# Patient Record
Sex: Male | Born: 1935 | Race: White | Hispanic: No | Marital: Married | Smoking: Never smoker
Health system: Southern US, Community
[De-identification: ages and names within clinical notes are randomized; demographics above are authoritative.]

## PROBLEM LIST (undated history)

## (undated) DIAGNOSIS — K449 Diaphragmatic hernia without obstruction or gangrene: Secondary | ICD-10-CM

## (undated) DIAGNOSIS — G20 Parkinson's disease: Secondary | ICD-10-CM

## (undated) DIAGNOSIS — I499 Cardiac arrhythmia, unspecified: Secondary | ICD-10-CM

## (undated) DIAGNOSIS — G20A1 Parkinson's disease without dyskinesia, without mention of fluctuations: Secondary | ICD-10-CM

## (undated) DIAGNOSIS — K635 Polyp of colon: Secondary | ICD-10-CM

## (undated) DIAGNOSIS — R269 Unspecified abnormalities of gait and mobility: Secondary | ICD-10-CM

## (undated) DIAGNOSIS — G250 Essential tremor: Secondary | ICD-10-CM

## (undated) DIAGNOSIS — I10 Essential (primary) hypertension: Secondary | ICD-10-CM

## (undated) DIAGNOSIS — K219 Gastro-esophageal reflux disease without esophagitis: Secondary | ICD-10-CM

## (undated) DIAGNOSIS — C61 Malignant neoplasm of prostate: Secondary | ICD-10-CM

## (undated) DIAGNOSIS — J309 Allergic rhinitis, unspecified: Secondary | ICD-10-CM

## (undated) DIAGNOSIS — G252 Other specified forms of tremor: Secondary | ICD-10-CM

## (undated) DIAGNOSIS — K644 Residual hemorrhoidal skin tags: Secondary | ICD-10-CM

## (undated) DIAGNOSIS — M519 Unspecified thoracic, thoracolumbar and lumbosacral intervertebral disc disorder: Secondary | ICD-10-CM

## (undated) DIAGNOSIS — N486 Induration penis plastica: Secondary | ICD-10-CM

## (undated) DIAGNOSIS — K859 Acute pancreatitis without necrosis or infection, unspecified: Secondary | ICD-10-CM

## (undated) DIAGNOSIS — E785 Hyperlipidemia, unspecified: Secondary | ICD-10-CM

## (undated) HISTORY — DX: Allergic rhinitis, unspecified: J30.9

## (undated) HISTORY — PX: EYE SURGERY: SHX253

## (undated) HISTORY — DX: Parkinson's disease: G20

## (undated) HISTORY — DX: Hyperlipidemia, unspecified: E78.5

## (undated) HISTORY — DX: Residual hemorrhoidal skin tags: K64.4

## (undated) HISTORY — DX: Gastro-esophageal reflux disease without esophagitis: K21.9

## (undated) HISTORY — DX: Essential tremor: G25.2

## (undated) HISTORY — DX: Cardiac arrhythmia, unspecified: I49.9

## (undated) HISTORY — PX: KNEE SURGERY: SHX244

## (undated) HISTORY — PX: HEMORRHOID SURGERY: SHX153

## (undated) HISTORY — PX: CATARACT EXTRACTION: SUR2

## (undated) HISTORY — DX: Diaphragmatic hernia without obstruction or gangrene: K44.9

## (undated) HISTORY — DX: Polyp of colon: K63.5

## (undated) HISTORY — DX: Acute pancreatitis without necrosis or infection, unspecified: K85.90

## (undated) HISTORY — PX: SHOULDER SURGERY: SHX246

## (undated) HISTORY — DX: Essential (primary) hypertension: I10

## (undated) HISTORY — DX: Essential tremor: G25.0

## (undated) HISTORY — DX: Malignant neoplasm of prostate: C61

## (undated) HISTORY — DX: Unspecified thoracic, thoracolumbar and lumbosacral intervertebral disc disorder: M51.9

---
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
## 1974-08-21 DIAGNOSIS — K859 Acute pancreatitis without necrosis or infection, unspecified: Secondary | ICD-10-CM

## 1974-08-21 HISTORY — DX: Acute pancreatitis without necrosis or infection, unspecified: K85.90

## 1999-01-03 ENCOUNTER — Encounter: Payer: Self-pay | Admitting: Family Medicine

## 1999-01-03 ENCOUNTER — Ambulatory Visit (HOSPITAL_COMMUNITY): Admission: RE | Admit: 1999-01-03 | Discharge: 1999-01-03 | Payer: Self-pay | Admitting: *Deleted

## 2000-02-24 ENCOUNTER — Ambulatory Visit (HOSPITAL_COMMUNITY): Admission: RE | Admit: 2000-02-24 | Discharge: 2000-02-24 | Payer: Self-pay | Admitting: Gastroenterology

## 2000-11-12 ENCOUNTER — Encounter: Admission: RE | Admit: 2000-11-12 | Discharge: 2000-11-12 | Payer: Self-pay | Admitting: Gastroenterology

## 2000-11-12 ENCOUNTER — Encounter: Payer: Self-pay | Admitting: Gastroenterology

## 2001-03-18 ENCOUNTER — Ambulatory Visit (HOSPITAL_COMMUNITY): Admission: AD | Admit: 2001-03-18 | Discharge: 2001-03-18 | Payer: Self-pay | Admitting: Cardiology

## 2003-05-19 ENCOUNTER — Encounter: Payer: Self-pay | Admitting: Family Medicine

## 2003-05-19 ENCOUNTER — Encounter: Admission: RE | Admit: 2003-05-19 | Discharge: 2003-05-19 | Payer: Self-pay | Admitting: Family Medicine

## 2004-10-19 LAB — HM COLONOSCOPY

## 2004-10-24 ENCOUNTER — Ambulatory Visit (HOSPITAL_COMMUNITY): Admission: RE | Admit: 2004-10-24 | Discharge: 2004-10-24 | Payer: Self-pay | Admitting: Gastroenterology

## 2005-09-28 ENCOUNTER — Encounter: Admission: RE | Admit: 2005-09-28 | Discharge: 2005-09-28 | Payer: Self-pay | Admitting: Cardiology

## 2005-12-31 ENCOUNTER — Emergency Department (HOSPITAL_COMMUNITY): Admission: EM | Admit: 2005-12-31 | Discharge: 2005-12-31 | Payer: Self-pay | Admitting: Emergency Medicine

## 2006-01-04 ENCOUNTER — Ambulatory Visit (HOSPITAL_COMMUNITY): Admission: RE | Admit: 2006-01-04 | Discharge: 2006-01-04 | Payer: Self-pay | Admitting: Family Medicine

## 2006-01-13 ENCOUNTER — Ambulatory Visit (HOSPITAL_COMMUNITY): Admission: RE | Admit: 2006-01-13 | Discharge: 2006-01-13 | Payer: Self-pay | Admitting: Orthopedic Surgery

## 2006-02-26 ENCOUNTER — Encounter: Admission: RE | Admit: 2006-02-26 | Discharge: 2006-04-25 | Payer: Self-pay | Admitting: Orthopedic Surgery

## 2006-04-26 ENCOUNTER — Encounter: Admission: RE | Admit: 2006-04-26 | Discharge: 2006-07-25 | Payer: Self-pay | Admitting: Orthopedic Surgery

## 2010-11-11 ENCOUNTER — Encounter: Payer: Self-pay | Admitting: Family Medicine

## 2010-11-11 DIAGNOSIS — M2550 Pain in unspecified joint: Secondary | ICD-10-CM

## 2010-11-11 DIAGNOSIS — J309 Allergic rhinitis, unspecified: Secondary | ICD-10-CM

## 2010-11-11 DIAGNOSIS — C61 Malignant neoplasm of prostate: Secondary | ICD-10-CM | POA: Insufficient documentation

## 2010-11-11 DIAGNOSIS — E785 Hyperlipidemia, unspecified: Secondary | ICD-10-CM

## 2010-11-11 DIAGNOSIS — M545 Low back pain, unspecified: Secondary | ICD-10-CM

## 2010-11-11 DIAGNOSIS — I499 Cardiac arrhythmia, unspecified: Secondary | ICD-10-CM

## 2010-11-11 DIAGNOSIS — K635 Polyp of colon: Secondary | ICD-10-CM

## 2010-11-11 DIAGNOSIS — K859 Acute pancreatitis without necrosis or infection, unspecified: Secondary | ICD-10-CM

## 2010-11-11 DIAGNOSIS — I10 Essential (primary) hypertension: Secondary | ICD-10-CM | POA: Insufficient documentation

## 2011-01-11 ENCOUNTER — Ambulatory Visit (INDEPENDENT_AMBULATORY_CARE_PROVIDER_SITE_OTHER): Payer: Medicare Other | Admitting: Internal Medicine

## 2011-01-11 ENCOUNTER — Encounter: Payer: Self-pay | Admitting: Internal Medicine

## 2011-01-11 VITALS — BP 120/60 | HR 88 | Ht 69.0 in | Wt 166.0 lb

## 2011-01-11 DIAGNOSIS — K649 Unspecified hemorrhoids: Secondary | ICD-10-CM

## 2011-01-11 DIAGNOSIS — K648 Other hemorrhoids: Secondary | ICD-10-CM

## 2011-01-11 NOTE — Progress Notes (Signed)
HISTORY OF PRESENT ILLNESS:  Keith Hill is a 77 y.o. male who is in today regarding "external hemorrhoid". The patient reports a long history of what he describes as a prolapsing rectal lesion. He has been seen previously by Dr. Vida Rigger [facility]) for this problem and routine colonoscopies. His last colonoscopy was performed in March of 2006. I have reviewed the report, which states internal and external hemorrhoids with an anal papilla as well as sigmoid diverticulosis and a proximal colon polyp. The exam was complete to the cecum with adequate preparation. Review of the pathology showed hyperplastic tissue. It was recommended that a followup for routine colonoscopy in 10 years. He was seen again in April 2007 for a prolapsing rectal lesion. No bleeding or pain, only difficult to clean and requires manual decompression. This is the same complaint today. He did see a general surgeon, Dr. Ovidio Kin, in September 2007. I have reviewed the office note. The evaluation included rectal exam and anoscopy. The patient was not felt to have significant pathology at that time. The patient has no other GI complaints except for occasional constipation and gas.   REVIEW OF SYSTEMS:  All non-GI ROS negative except for muscle cramps.  Past Medical History  Diagnosis Date  . Pancreatitis 1976  . Lumbago   . Pain in joint, multiple sites   . Other and unspecified hyperlipidemia   . Hyperplasia of prostate   . Essential hypertension, benign   . Cardiac dysrhythmia, unspecified   . Allergic rhinitis, cause unspecified   . Colon polyps   . Hemorrhoids, external   . Hiatal hernia   . GERD (gastroesophageal reflux disease)   . Arthritis   . Hyperlipemia   . Hypertension     Past Surgical History  Procedure Date  . Leg surgery   . Shoulder surgery     Social History Keith Hill  reports that he has never smoked. He has never used smokeless tobacco. He reports that he does not drink  alcohol or use illicit drugs.  family history is not on file.  Allergies  Allergen Reactions  . Aspirin Free Childs (Acetaminophen)   . Calicum Glycerophosphate   . Niaspan (Niacin (Antihyperlipidemic))   . Prednisone   . Zocor (Simvastatin)        PHYSICAL EXAMINATION: Vital signs: BP 120/60  Pulse 88  Ht 5\' 9"  (1.753 m)  Wt 166 lb (75.297 kg)  BMI 24.51 kg/m2  Constitutional: generally well-appearing, no acute distress Psychiatric: alert and oriented x3, cooperative Eyes: extraocular movements intact, anicteric, conjunctiva pink Mouth: oral pharynx moist, no lesions Neck: supple no lymphadenopathy Cardiovascular: heart regular rate and rhythm, no murmur Lungs: clear to auscultation bilaterally Abdomen: soft, nontender, nondistended, no obvious ascites, no peritoneal signs, normal bowel sounds, no organomegaly Rectal: 1.5-2 cm pedunculated prolapsing lesion, either prolapsing hemorrhoid or anal papilla. Extremities: no lower extremity edema bilaterally Skin: no lesions on visible extremities Neuro: No focal deficits.   ASSESSMENT:  #1. Noninflamed nontender prolapsing rectal lesion as described. Either hemorrhoid or anal papilla. This requires surgical attention for definitive treatment.  #2. Colon cancer screening. Up-to-date. Due for routine followup around March 2016   PLAN:  #1. Surgical referral

## 2011-01-11 NOTE — Patient Instructions (Signed)
Appointment with Dr. Ezzard Standing CCS 01/12/11 2:00 pm arrive at 1:45 pm

## 2011-01-17 ENCOUNTER — Encounter: Payer: Self-pay | Admitting: Gastroenterology

## 2011-01-23 ENCOUNTER — Other Ambulatory Visit (INDEPENDENT_AMBULATORY_CARE_PROVIDER_SITE_OTHER): Payer: Self-pay | Admitting: Surgery

## 2011-01-23 ENCOUNTER — Ambulatory Visit (HOSPITAL_COMMUNITY)
Admission: RE | Admit: 2011-01-23 | Discharge: 2011-01-23 | Disposition: A | Discharge status: Home / Self Care | Payer: Medicare Other | Source: Ambulatory Visit | Attending: Surgery | Admitting: Surgery

## 2011-01-23 ENCOUNTER — Encounter (HOSPITAL_COMMUNITY): Payer: Medicare Other

## 2011-01-23 DIAGNOSIS — Z01818 Encounter for other preprocedural examination: Secondary | ICD-10-CM

## 2011-01-23 LAB — BASIC METABOLIC PANEL
BUN: 24 mg/dL — ABNORMAL HIGH (ref 6–23)
CO2: 27 mEq/L (ref 19–32)
Calcium: 9.4 mg/dL (ref 8.4–10.5)
Chloride: 102 mEq/L (ref 96–112)
Creatinine, Ser: 1.18 mg/dL (ref 0.4–1.5)
GFR calc Af Amer: >60 mL/min (ref >60)
GFR calc non Af Amer: 60 mL/min — ABNORMAL LOW (ref >60)
Sodium: 138 mEq/L (ref 135–145)

## 2011-01-23 LAB — SURGICAL PCR SCREEN
MRSA, PCR: NEGATIVE
Staphylococcus aureus: NEGATIVE

## 2011-01-23 LAB — CBC
MCHC: 33.9 g/dL (ref 30.0–36.0)
RBC: 3.92 MIL/uL — ABNORMAL LOW (ref 4.22–5.81)
WBC: 7.9 10*3/uL (ref 4.0–10.5)

## 2011-01-24 ENCOUNTER — Other Ambulatory Visit (INDEPENDENT_AMBULATORY_CARE_PROVIDER_SITE_OTHER): Payer: Self-pay | Admitting: Surgery

## 2011-01-24 ENCOUNTER — Ambulatory Visit (HOSPITAL_COMMUNITY)
Admission: RE | Admit: 2011-01-24 | Discharge: 2011-01-24 | Disposition: A | Discharge status: Home / Self Care | Payer: Medicare Other | Source: Ambulatory Visit | Attending: Surgery | Admitting: Surgery

## 2011-01-24 DIAGNOSIS — Z01818 Encounter for other preprocedural examination: Secondary | ICD-10-CM | POA: Insufficient documentation

## 2011-01-24 DIAGNOSIS — Z79899 Other long term (current) drug therapy: Secondary | ICD-10-CM | POA: Insufficient documentation

## 2011-01-24 DIAGNOSIS — I2510 Atherosclerotic heart disease of native coronary artery without angina pectoris: Secondary | ICD-10-CM | POA: Insufficient documentation

## 2011-01-24 DIAGNOSIS — K644 Residual hemorrhoidal skin tags: Secondary | ICD-10-CM | POA: Insufficient documentation

## 2011-01-24 DIAGNOSIS — Z01812 Encounter for preprocedural laboratory examination: Secondary | ICD-10-CM | POA: Insufficient documentation

## 2011-01-24 DIAGNOSIS — M19049 Primary osteoarthritis, unspecified hand: Secondary | ICD-10-CM | POA: Insufficient documentation

## 2011-01-24 DIAGNOSIS — E7800 Pure hypercholesterolemia, unspecified: Secondary | ICD-10-CM | POA: Insufficient documentation

## 2011-01-24 DIAGNOSIS — I10 Essential (primary) hypertension: Secondary | ICD-10-CM | POA: Insufficient documentation

## 2011-01-24 DIAGNOSIS — K5730 Diverticulosis of large intestine without perforation or abscess without bleeding: Secondary | ICD-10-CM | POA: Insufficient documentation

## 2011-01-24 DIAGNOSIS — N400 Enlarged prostate without lower urinary tract symptoms: Secondary | ICD-10-CM | POA: Insufficient documentation

## 2011-01-24 NOTE — Op Note (Signed)
Keith Hill, Keith Hill NO.:  [id number]  MEDICAL RECORD NO.:  [id number]  LOCATION:  DAYL                         FACILITY:  [facility]  PHYSICIAN:  Sandria Bales. Ezzard Standing, M.D.  DATE OF BIRTH:  [birth year]  DATE OF PROCEDURE:  01/24/2011                              OPERATIVE REPORT   PREOPERATIVE DIAGNOSIS:  Right anterior hemorrhoid.  POSTOPERATIVE DIAGNOSIS:  Right anterior hemorrhoid at approximately the 10 o'clock position, sigmoid diverticulosis.  PROCEDURE:  Single column hemorrhoidectomy, flexible sigmoidoscopy to 50 cm.  SURGEON:  Sandria Bales. Ezzard Standing, M.D.  FIRST ASSISTANT:  No first assistant.  ANESTHESIA:  General LMA with 20 cc of Exparel as a local anesthetic.  COMPLICATIONS:  Complications were none.  INDICATIONS FOR PROCEDURE:  Keith Hill is a 77 year old white male who sees Dr. Vernon Prey from a medical standpoint, Dr. Viann Fish from a cardiac standpoint and has seen Dr. Yancey Flemings from a GI standpoint.  He has had some hemorrhoidal symptoms which has gotten worse.  On physical examination he has a prominent external hemorrhoid of the right anterior rectum which has become irritated and bothering him with bowel movements.  I discussed with him proceeding with hemorrhoidectomy.  I discussed the indications and risks of hemorrhoid surgery.  The risks include, but are not limited to, bleeding, infection, nerve injury and having other hemorrhoids despite this one that could come in the future.  OPERATIVE NOTE:  The patient completed a mechanical bowel prep at home. He presented to the [facility].  He underwent a general endotracheal anesthetic as supervised by Dr. Eilene Ghazi in room #11.  He was given 1 gram of cefoxitin at the initiation of the procedure.  A time-out was held and the surgical checklist run.    I did a flexible sigmoidoscopy to 50 cm.  His last colonoscopy was about 5 or 6 years earlier.  I saw some sigmoid colon  diverticulosis, but he had no mucosal lesion or mass in the distal 50 cm of his colon.  I then prepped his rectum with Betadine solution and sterilely draped him and did an examination.  He has, in the lithotomy position, at  the 10 o'clock position, a 2-cm hypertrophic hemorrhoidal tissue.  There was no other rectal mass and minimal other hemorrhoid disease.  So I ligated this hemorrhoid with a 2-0 chromic suture and I then excised the hemorrhoid and closed the defect with a running 2-0 chromic suture.  I then injected Exparel, a long-acting bupivacaine, using 20 cc around the rectum.  I then put a gauze in the rectum and waited 5 minutes to make sure there was no bleeding.  The gauze was then removed.  I then packed his rectum with Gelfoam covered with Nupercaine ointment.  The patient tolerated the procedure well and was transported to the recovery room in good condition.  He will be discharged home today.  He will return to see me in 2 to 3 weeks for followup.    Sandria Bales. Ezzard Standing, M.D., FACS   DHN/MEDQ  D:  01/24/2011  T:  01/24/2011  Job:  [id number]  cc:   Ernestina Penna, M.D. Fax:  [phone]  Wilhemina Bonito. Marina Goodell, MD [address] [place] [address]  Dr. Viann Fish  Electronically Signed by Ovidio Kin M.D. on 01/24/2011 04:46:47 PM

## 2011-02-17 ENCOUNTER — Encounter (INDEPENDENT_AMBULATORY_CARE_PROVIDER_SITE_OTHER): Payer: Self-pay | Admitting: Surgery

## 2011-02-17 ENCOUNTER — Ambulatory Visit (INDEPENDENT_AMBULATORY_CARE_PROVIDER_SITE_OTHER): Payer: Medicare Other | Admitting: Surgery

## 2011-02-17 DIAGNOSIS — K648 Other hemorrhoids: Secondary | ICD-10-CM

## 2011-02-17 DIAGNOSIS — K644 Residual hemorrhoidal skin tags: Secondary | ICD-10-CM

## 2011-02-17 NOTE — Patient Instructions (Signed)
Call if you have any problems but this could be your last visit.  Copy of pathology given to patient.

## 2011-02-17 NOTE — Progress Notes (Signed)
HPI: The patient is a 77 year old white male who is a patient of Dr. Vernon Prey.  I did a single column hemorrhoidectomy on him on 24 January 2011. He has done well since surgery. He had some trouble with constipation early after surgery. He still has some staining of his underwear but this is getting better.  ROS: []   PE: Rectum: He has a healing wound on his rectum without inflammation.  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 I gave the patient copy of the path report.  Assessement: #1. Postop hemorrhoidectomy doing well. #2. BPH. #3. Hypertension.  Plan: I will make this his last visit with me and he knows to call me for any further problems.

## 2011-02-27 ENCOUNTER — Other Ambulatory Visit: Payer: Self-pay | Admitting: Cardiology

## 2011-02-27 DIAGNOSIS — I10 Essential (primary) hypertension: Secondary | ICD-10-CM

## 2011-03-02 ENCOUNTER — Ambulatory Visit
Admission: RE | Admit: 2011-03-02 | Discharge: 2011-03-02 | Disposition: A | Discharge status: Home / Self Care | Payer: Medicare Other | Source: Ambulatory Visit | Attending: Cardiology | Admitting: Cardiology

## 2011-03-02 DIAGNOSIS — I10 Essential (primary) hypertension: Secondary | ICD-10-CM

## 2011-08-31 DIAGNOSIS — I10 Essential (primary) hypertension: Secondary | ICD-10-CM | POA: Diagnosis not present

## 2011-08-31 DIAGNOSIS — E785 Hyperlipidemia, unspecified: Secondary | ICD-10-CM | POA: Diagnosis not present

## 2011-08-31 DIAGNOSIS — E559 Vitamin D deficiency, unspecified: Secondary | ICD-10-CM | POA: Diagnosis not present

## 2011-09-12 DIAGNOSIS — Z1212 Encounter for screening for malignant neoplasm of rectum: Secondary | ICD-10-CM | POA: Diagnosis not present

## 2011-10-03 DIAGNOSIS — N402 Nodular prostate without lower urinary tract symptoms: Secondary | ICD-10-CM | POA: Diagnosis not present

## 2011-11-01 DIAGNOSIS — R972 Elevated prostate specific antigen [PSA]: Secondary | ICD-10-CM | POA: Diagnosis not present

## 2011-11-01 DIAGNOSIS — N402 Nodular prostate without lower urinary tract symptoms: Secondary | ICD-10-CM | POA: Diagnosis not present

## 2011-11-01 HISTORY — PX: PROSTATE BIOPSY: SHX241

## 2011-11-09 DIAGNOSIS — G252 Other specified forms of tremor: Secondary | ICD-10-CM | POA: Diagnosis not present

## 2011-11-14 ENCOUNTER — Other Ambulatory Visit: Payer: Self-pay | Admitting: Neurology

## 2011-11-14 DIAGNOSIS — G250 Essential tremor: Secondary | ICD-10-CM

## 2011-11-16 ENCOUNTER — Ambulatory Visit
Admission: RE | Admit: 2011-11-16 | Discharge: 2011-11-16 | Disposition: A | Discharge status: Home / Self Care | Payer: Medicare Other | Source: Ambulatory Visit | Attending: Neurology | Admitting: Neurology

## 2011-11-16 DIAGNOSIS — G250 Essential tremor: Secondary | ICD-10-CM | POA: Diagnosis not present

## 2011-11-16 DIAGNOSIS — G252 Other specified forms of tremor: Secondary | ICD-10-CM | POA: Diagnosis not present

## 2011-11-21 DIAGNOSIS — C61 Malignant neoplasm of prostate: Secondary | ICD-10-CM | POA: Diagnosis not present

## 2011-12-05 DIAGNOSIS — E785 Hyperlipidemia, unspecified: Secondary | ICD-10-CM | POA: Diagnosis not present

## 2011-12-05 DIAGNOSIS — E039 Hypothyroidism, unspecified: Secondary | ICD-10-CM | POA: Diagnosis not present

## 2011-12-05 DIAGNOSIS — E559 Vitamin D deficiency, unspecified: Secondary | ICD-10-CM | POA: Diagnosis not present

## 2011-12-05 DIAGNOSIS — C61 Malignant neoplasm of prostate: Secondary | ICD-10-CM | POA: Diagnosis not present

## 2011-12-05 DIAGNOSIS — I10 Essential (primary) hypertension: Secondary | ICD-10-CM | POA: Diagnosis not present

## 2011-12-20 DIAGNOSIS — M545 Low back pain: Secondary | ICD-10-CM | POA: Diagnosis not present

## 2011-12-20 DIAGNOSIS — E785 Hyperlipidemia, unspecified: Secondary | ICD-10-CM | POA: Diagnosis not present

## 2011-12-20 DIAGNOSIS — N400 Enlarged prostate without lower urinary tract symptoms: Secondary | ICD-10-CM | POA: Diagnosis not present

## 2011-12-28 DIAGNOSIS — I10 Essential (primary) hypertension: Secondary | ICD-10-CM | POA: Diagnosis not present

## 2012-01-23 DIAGNOSIS — C61 Malignant neoplasm of prostate: Secondary | ICD-10-CM | POA: Diagnosis not present

## 2012-01-25 DIAGNOSIS — G252 Other specified forms of tremor: Secondary | ICD-10-CM | POA: Diagnosis not present

## 2012-01-25 DIAGNOSIS — G250 Essential tremor: Secondary | ICD-10-CM | POA: Diagnosis not present

## 2012-03-28 DIAGNOSIS — I10 Essential (primary) hypertension: Secondary | ICD-10-CM | POA: Diagnosis not present

## 2012-03-28 DIAGNOSIS — E559 Vitamin D deficiency, unspecified: Secondary | ICD-10-CM | POA: Diagnosis not present

## 2012-03-28 DIAGNOSIS — E785 Hyperlipidemia, unspecified: Secondary | ICD-10-CM | POA: Diagnosis not present

## 2012-03-28 DIAGNOSIS — R5381 Other malaise: Secondary | ICD-10-CM | POA: Diagnosis not present

## 2012-04-04 DIAGNOSIS — Z79899 Other long term (current) drug therapy: Secondary | ICD-10-CM | POA: Diagnosis not present

## 2012-04-04 DIAGNOSIS — D649 Anemia, unspecified: Secondary | ICD-10-CM | POA: Diagnosis not present

## 2012-04-04 DIAGNOSIS — I2510 Atherosclerotic heart disease of native coronary artery without angina pectoris: Secondary | ICD-10-CM | POA: Diagnosis not present

## 2012-04-04 DIAGNOSIS — R7989 Other specified abnormal findings of blood chemistry: Secondary | ICD-10-CM | POA: Diagnosis not present

## 2012-04-04 DIAGNOSIS — R5381 Other malaise: Secondary | ICD-10-CM | POA: Diagnosis not present

## 2012-04-05 DIAGNOSIS — Z1212 Encounter for screening for malignant neoplasm of rectum: Secondary | ICD-10-CM | POA: Diagnosis not present

## 2012-04-09 DIAGNOSIS — E785 Hyperlipidemia, unspecified: Secondary | ICD-10-CM | POA: Diagnosis not present

## 2012-04-09 DIAGNOSIS — I2510 Atherosclerotic heart disease of native coronary artery without angina pectoris: Secondary | ICD-10-CM | POA: Diagnosis not present

## 2012-04-09 DIAGNOSIS — I10 Essential (primary) hypertension: Secondary | ICD-10-CM | POA: Diagnosis not present

## 2012-04-11 DIAGNOSIS — H43399 Other vitreous opacities, unspecified eye: Secondary | ICD-10-CM | POA: Diagnosis not present

## 2012-04-11 DIAGNOSIS — H538 Other visual disturbances: Secondary | ICD-10-CM | POA: Diagnosis not present

## 2012-04-11 DIAGNOSIS — H2510 Age-related nuclear cataract, unspecified eye: Secondary | ICD-10-CM | POA: Diagnosis not present

## 2012-04-18 DIAGNOSIS — C61 Malignant neoplasm of prostate: Secondary | ICD-10-CM | POA: Diagnosis not present

## 2012-04-18 DIAGNOSIS — R972 Elevated prostate specific antigen [PSA]: Secondary | ICD-10-CM | POA: Diagnosis not present

## 2012-05-03 DIAGNOSIS — M7020 Olecranon bursitis, unspecified elbow: Secondary | ICD-10-CM | POA: Diagnosis not present

## 2012-05-03 DIAGNOSIS — M25529 Pain in unspecified elbow: Secondary | ICD-10-CM | POA: Diagnosis not present

## 2012-05-10 ENCOUNTER — Other Ambulatory Visit: Payer: Self-pay

## 2012-05-10 DIAGNOSIS — D485 Neoplasm of uncertain behavior of skin: Secondary | ICD-10-CM | POA: Diagnosis not present

## 2012-05-10 DIAGNOSIS — L820 Inflamed seborrheic keratosis: Secondary | ICD-10-CM | POA: Diagnosis not present

## 2012-05-14 DIAGNOSIS — Z23 Encounter for immunization: Secondary | ICD-10-CM | POA: Diagnosis not present

## 2012-05-28 DIAGNOSIS — G252 Other specified forms of tremor: Secondary | ICD-10-CM | POA: Diagnosis not present

## 2012-05-28 DIAGNOSIS — G250 Essential tremor: Secondary | ICD-10-CM | POA: Diagnosis not present

## 2012-07-02 DIAGNOSIS — R7989 Other specified abnormal findings of blood chemistry: Secondary | ICD-10-CM | POA: Diagnosis not present

## 2012-07-02 DIAGNOSIS — I10 Essential (primary) hypertension: Secondary | ICD-10-CM | POA: Diagnosis not present

## 2012-07-02 DIAGNOSIS — E559 Vitamin D deficiency, unspecified: Secondary | ICD-10-CM | POA: Diagnosis not present

## 2012-07-02 DIAGNOSIS — E785 Hyperlipidemia, unspecified: Secondary | ICD-10-CM | POA: Diagnosis not present

## 2012-07-08 DIAGNOSIS — E785 Hyperlipidemia, unspecified: Secondary | ICD-10-CM | POA: Diagnosis not present

## 2012-07-08 DIAGNOSIS — R259 Unspecified abnormal involuntary movements: Secondary | ICD-10-CM | POA: Diagnosis not present

## 2012-07-12 ENCOUNTER — Other Ambulatory Visit: Payer: Self-pay | Admitting: Family Medicine

## 2012-07-12 DIAGNOSIS — M545 Low back pain: Secondary | ICD-10-CM

## 2012-07-12 DIAGNOSIS — R269 Unspecified abnormalities of gait and mobility: Secondary | ICD-10-CM

## 2012-07-15 DIAGNOSIS — C61 Malignant neoplasm of prostate: Secondary | ICD-10-CM | POA: Diagnosis not present

## 2012-07-16 ENCOUNTER — Ambulatory Visit (HOSPITAL_COMMUNITY)
Admission: RE | Admit: 2012-07-16 | Discharge: 2012-07-16 | Disposition: A | Discharge status: Home / Self Care | Payer: Medicare Other | Source: Ambulatory Visit | Attending: Family Medicine | Admitting: Family Medicine

## 2012-07-16 DIAGNOSIS — M545 Low back pain, unspecified: Secondary | ICD-10-CM | POA: Insufficient documentation

## 2012-07-16 DIAGNOSIS — R269 Unspecified abnormalities of gait and mobility: Secondary | ICD-10-CM | POA: Diagnosis not present

## 2012-07-16 DIAGNOSIS — M47817 Spondylosis without myelopathy or radiculopathy, lumbosacral region: Secondary | ICD-10-CM | POA: Insufficient documentation

## 2012-07-16 DIAGNOSIS — M5137 Other intervertebral disc degeneration, lumbosacral region: Secondary | ICD-10-CM | POA: Diagnosis not present

## 2012-07-30 DIAGNOSIS — C61 Malignant neoplasm of prostate: Secondary | ICD-10-CM | POA: Diagnosis not present

## 2012-08-01 DIAGNOSIS — M479 Spondylosis, unspecified: Secondary | ICD-10-CM | POA: Diagnosis not present

## 2012-08-01 DIAGNOSIS — M5137 Other intervertebral disc degeneration, lumbosacral region: Secondary | ICD-10-CM | POA: Diagnosis not present

## 2012-08-01 DIAGNOSIS — M545 Low back pain: Secondary | ICD-10-CM | POA: Diagnosis not present

## 2012-08-03 DIAGNOSIS — G250 Essential tremor: Secondary | ICD-10-CM | POA: Insufficient documentation

## 2012-08-03 DIAGNOSIS — R6889 Other general symptoms and signs: Secondary | ICD-10-CM

## 2012-08-03 DIAGNOSIS — G252 Other specified forms of tremor: Secondary | ICD-10-CM | POA: Insufficient documentation

## 2012-08-16 DIAGNOSIS — M47817 Spondylosis without myelopathy or radiculopathy, lumbosacral region: Secondary | ICD-10-CM | POA: Diagnosis not present

## 2012-08-16 DIAGNOSIS — M5137 Other intervertebral disc degeneration, lumbosacral region: Secondary | ICD-10-CM | POA: Diagnosis not present

## 2012-08-30 DIAGNOSIS — M545 Low back pain: Secondary | ICD-10-CM | POA: Diagnosis not present

## 2012-08-30 DIAGNOSIS — M479 Spondylosis, unspecified: Secondary | ICD-10-CM | POA: Diagnosis not present

## 2012-08-30 DIAGNOSIS — M5137 Other intervertebral disc degeneration, lumbosacral region: Secondary | ICD-10-CM | POA: Diagnosis not present

## 2012-10-02 DIAGNOSIS — M999 Biomechanical lesion, unspecified: Secondary | ICD-10-CM | POA: Diagnosis not present

## 2012-10-02 DIAGNOSIS — M5137 Other intervertebral disc degeneration, lumbosacral region: Secondary | ICD-10-CM | POA: Diagnosis not present

## 2012-10-07 DIAGNOSIS — M5137 Other intervertebral disc degeneration, lumbosacral region: Secondary | ICD-10-CM | POA: Diagnosis not present

## 2012-10-07 DIAGNOSIS — M999 Biomechanical lesion, unspecified: Secondary | ICD-10-CM | POA: Diagnosis not present

## 2012-10-08 DIAGNOSIS — M5137 Other intervertebral disc degeneration, lumbosacral region: Secondary | ICD-10-CM | POA: Diagnosis not present

## 2012-10-08 DIAGNOSIS — M999 Biomechanical lesion, unspecified: Secondary | ICD-10-CM | POA: Diagnosis not present

## 2012-10-09 DIAGNOSIS — E559 Vitamin D deficiency, unspecified: Secondary | ICD-10-CM | POA: Diagnosis not present

## 2012-10-09 DIAGNOSIS — E785 Hyperlipidemia, unspecified: Secondary | ICD-10-CM | POA: Diagnosis not present

## 2012-10-09 DIAGNOSIS — I10 Essential (primary) hypertension: Secondary | ICD-10-CM | POA: Diagnosis not present

## 2012-10-10 DIAGNOSIS — M5137 Other intervertebral disc degeneration, lumbosacral region: Secondary | ICD-10-CM | POA: Diagnosis not present

## 2012-10-10 DIAGNOSIS — M999 Biomechanical lesion, unspecified: Secondary | ICD-10-CM | POA: Diagnosis not present

## 2012-10-14 DIAGNOSIS — M5137 Other intervertebral disc degeneration, lumbosacral region: Secondary | ICD-10-CM | POA: Diagnosis not present

## 2012-10-14 DIAGNOSIS — M999 Biomechanical lesion, unspecified: Secondary | ICD-10-CM | POA: Diagnosis not present

## 2012-10-15 DIAGNOSIS — M5137 Other intervertebral disc degeneration, lumbosacral region: Secondary | ICD-10-CM | POA: Diagnosis not present

## 2012-10-15 DIAGNOSIS — M999 Biomechanical lesion, unspecified: Secondary | ICD-10-CM | POA: Diagnosis not present

## 2012-10-16 DIAGNOSIS — M999 Biomechanical lesion, unspecified: Secondary | ICD-10-CM | POA: Diagnosis not present

## 2012-10-16 DIAGNOSIS — M5137 Other intervertebral disc degeneration, lumbosacral region: Secondary | ICD-10-CM | POA: Diagnosis not present

## 2012-10-17 DIAGNOSIS — I10 Essential (primary) hypertension: Secondary | ICD-10-CM | POA: Diagnosis not present

## 2012-10-17 DIAGNOSIS — E785 Hyperlipidemia, unspecified: Secondary | ICD-10-CM | POA: Diagnosis not present

## 2012-10-21 DIAGNOSIS — M999 Biomechanical lesion, unspecified: Secondary | ICD-10-CM | POA: Diagnosis not present

## 2012-10-21 DIAGNOSIS — M5137 Other intervertebral disc degeneration, lumbosacral region: Secondary | ICD-10-CM | POA: Diagnosis not present

## 2012-10-22 DIAGNOSIS — M999 Biomechanical lesion, unspecified: Secondary | ICD-10-CM | POA: Diagnosis not present

## 2012-10-22 DIAGNOSIS — M5137 Other intervertebral disc degeneration, lumbosacral region: Secondary | ICD-10-CM | POA: Diagnosis not present

## 2012-10-24 DIAGNOSIS — M5137 Other intervertebral disc degeneration, lumbosacral region: Secondary | ICD-10-CM | POA: Diagnosis not present

## 2012-10-24 DIAGNOSIS — M999 Biomechanical lesion, unspecified: Secondary | ICD-10-CM | POA: Diagnosis not present

## 2012-10-28 DIAGNOSIS — M999 Biomechanical lesion, unspecified: Secondary | ICD-10-CM | POA: Diagnosis not present

## 2012-10-28 DIAGNOSIS — M5137 Other intervertebral disc degeneration, lumbosacral region: Secondary | ICD-10-CM | POA: Diagnosis not present

## 2012-10-29 DIAGNOSIS — M5137 Other intervertebral disc degeneration, lumbosacral region: Secondary | ICD-10-CM | POA: Diagnosis not present

## 2012-10-29 DIAGNOSIS — M999 Biomechanical lesion, unspecified: Secondary | ICD-10-CM | POA: Diagnosis not present

## 2012-10-31 DIAGNOSIS — M5137 Other intervertebral disc degeneration, lumbosacral region: Secondary | ICD-10-CM | POA: Diagnosis not present

## 2012-10-31 DIAGNOSIS — M999 Biomechanical lesion, unspecified: Secondary | ICD-10-CM | POA: Diagnosis not present

## 2012-11-04 DIAGNOSIS — M5137 Other intervertebral disc degeneration, lumbosacral region: Secondary | ICD-10-CM | POA: Diagnosis not present

## 2012-11-04 DIAGNOSIS — M999 Biomechanical lesion, unspecified: Secondary | ICD-10-CM | POA: Diagnosis not present

## 2012-11-05 DIAGNOSIS — M5137 Other intervertebral disc degeneration, lumbosacral region: Secondary | ICD-10-CM | POA: Diagnosis not present

## 2012-11-05 DIAGNOSIS — M999 Biomechanical lesion, unspecified: Secondary | ICD-10-CM | POA: Diagnosis not present

## 2012-11-07 ENCOUNTER — Ambulatory Visit (INDEPENDENT_AMBULATORY_CARE_PROVIDER_SITE_OTHER): Payer: Medicare Other | Admitting: Neurology

## 2012-11-07 ENCOUNTER — Encounter: Payer: Self-pay | Admitting: Neurology

## 2012-11-07 VITALS — BP 118/70 | HR 68 | Ht 68.75 in | Wt 165.0 lb

## 2012-11-07 DIAGNOSIS — I10 Essential (primary) hypertension: Secondary | ICD-10-CM | POA: Diagnosis not present

## 2012-11-07 DIAGNOSIS — G20A1 Parkinson's disease without dyskinesia, without mention of fluctuations: Secondary | ICD-10-CM

## 2012-11-07 DIAGNOSIS — G20 Parkinson's disease: Secondary | ICD-10-CM | POA: Diagnosis not present

## 2012-11-07 DIAGNOSIS — M5137 Other intervertebral disc degeneration, lumbosacral region: Secondary | ICD-10-CM | POA: Diagnosis not present

## 2012-11-07 DIAGNOSIS — M999 Biomechanical lesion, unspecified: Secondary | ICD-10-CM | POA: Diagnosis not present

## 2012-11-07 HISTORY — DX: Parkinson's disease: G20

## 2012-11-07 HISTORY — DX: Parkinson's disease without dyskinesia, without mention of fluctuations: G20.A1

## 2012-11-07 MED ORDER — CARBIDOPA-LEVODOPA 25-100 MG PO TABS: 0.5000 | ORAL_TABLET | Freq: Three times a day (TID) | ORAL | Status: DC

## 2012-11-07 NOTE — Patient Instructions (Addendum)
  Will start sinemet, and follow up in 3 to 4 months.  Parkinson's Disease Parkinson's disease is a disorder of the central nervous system, which includes the brain and spinal cord. A person with this disease slowly loses the ability to completely control body movements. Within the brain, there is a group of nerve cells (basal ganglia) that help control movement. The basal ganglia are damaged and do not work properly in a person with Parkinson's disease. In addition, the basal ganglia produce and use a brain chemical called dopamine. The dopamine chemical sends messages to other parts of the body to control and coordinate body movements. Dopamine levels are low in a person with Parkinson's disease. If the dopamine levels are low, then the body does not receive the correct messages it needs to move normally.  CAUSES  The exact reason why the basal ganglia get damaged is not known. Some medical researchers have thought that infection, genes, environment, and certain medicines may contribute to the cause.  SYMPTOMS   An early symptom of Parkinson's disease is often an uncontrolled shaking (tremor) of the hands. The tremor will often disappear when the affected hand is consciously used.  As the disease progresses, walking, talking, getting out of a chair, and new movements become more difficult.  Muscles get stiff and movements become slower.  Balance and coordination become harder.  Depression, trouble swallowing, urinary problems, constipation, and sleep problems can occur.  Later in the disease, memory and thought processes may deteriorate. DIAGNOSIS  There are no specific tests to diagnose Parkinson's disease. You may be referred to a neurologist for evaluation. Your caregiver will ask about your medical history, symptoms, and perform a physical exam. Blood tests and imaging tests of your brain may be performed to rule out other diseases. The imaging tests may include an MRI or a CT  scan. TREATMENT  The goal of treatment is to relieve symptoms. Medicines may be prescribed once the symptoms become troublesome. Medicine will not stop the progression of the disease, but medicine can make movement and balance better and help control tremors. Speech and occupational therapy may also be prescribed. Sometimes, surgical treatment of the brain can be done in young people. HOME CARE INSTRUCTIONS  Get regular exercise and rest periods during the day to help prevent exhaustion and depression.  If getting dressed becomes difficult, replace buttons and zippers with Velcro and elastic on your clothing.  Take all medicine as directed by your caregiver.  Install grab bars or railings in your home to prevent falls.  Go to speech or occupational therapy as directed.  Keep all follow-up visits as directed by your caregiver. SEEK MEDICAL CARE IF:  Your symptoms are not controlled with your medicine.  You fall.  You have trouble swallowing or choke on your food. MAKE SURE YOU:  Understand these instructions.  Will watch your condition.  Will get help right away if you are not doing well or get worse. Document Released: 08/04/2000 Document Revised: 02/06/2012 Document Reviewed: 09/06/2011 [facility] Patient Information 2013 [place], [place].

## 2012-11-07 NOTE — Progress Notes (Signed)
   Reason for visit: Tremor  Keith Hill is an 79 y.o. male  History of present illness:  Keith Hill is a 79 year old right-handed white male with a history of a tremor. The patient indicates that the medications tried to date including Topamax, and propranolol have not been helpful. The patient was noted to have mild signs of parkinsonism on his last visit. The patient returns to this office for evaluation. The patient now has developed a unilateral resting tremor involving the right arm. The patient downplays the problems that he is having with feeding himself. The patient notes that with handwriting, the letters becomes small, and sloppy. The patient will occasionally stagger, and veer to the right. The patient has not had any falls. The patient returns for an evaluation.   ROS:  Out of a complete 14 system review of symptoms, the patient complains only of the following symptoms, and all other reviewed systems are negative.  Impotence Cramps Allergies Dizziness Tremor   Blood pressure 118/70, pulse 68, height 5' 8.75" (1.746 m), weight 165 lb (74.844 kg).  Physical Exam  General: The patient is alert and cooperative at the time of the examination.  Skin: No significant peripheral edema is noted.   Neurologic Exam  Cranial nerves: Facial symmetry is present. Speech is normal, no aphasia or dysarthria is noted. Extraocular movements are full. Visual fields are full. Masking of the face is seen.  Motor: The patient has good strength in all 4 extremities.  Coordination: The patient has good finger-nose-finger and heel-to-shin bilaterally. A mild intention tremor is seen bilaterally. A resting tremor is seen with the right arm.  Gait and station: The patient has a normal gait. Tandem gait is normal. Romberg is negative. No drift is seen. The patient is able to arise from a seated position with the arms crossed. Once up, the patient has a slightly stooped posture, and he has a  resting tremor with the right arm while walking. There is decreased arm swing on the right.  Reflexes: Deep tendon reflexes are symmetric.   Assessment/Plan:  One. Parkinson's disease  2. Intention tremor  The patient has had 2 different types of tremors. The patient has a mild intention tremor that is bilaterally symmetric, and now he has developed a prominent resting tremor affecting the right arm only. The patient has more prominent masking of the face, and slight stooped posture with walking. The right upper extremity tremor is prominent with walking. The patient will be placed on low-dose Sinemet, and he will followup in 3 or 4 months. The patient will be placed on Azilect at that time. The patient is off of propranolol.   Keith Palau MD 11/07/2012 2:52 PM

## 2012-12-12 ENCOUNTER — Other Ambulatory Visit: Payer: Self-pay | Admitting: Family Medicine

## 2012-12-19 ENCOUNTER — Telehealth: Payer: Self-pay | Admitting: Family Medicine

## 2012-12-19 DIAGNOSIS — G20 Parkinson's disease: Secondary | ICD-10-CM

## 2012-12-19 NOTE — Telephone Encounter (Signed)
Pt calls to report medication carivoldopa that was given 6 weeks ago hard for him to tell if working. Tremors no better Wants to know if Dr Anne Hahn is the best one to be seeing.

## 2012-12-19 NOTE — Telephone Encounter (Signed)
Pt aware we will call [facility] and will call Dr Gershon Crane at the [phone] or Dr Garth Schlatter  Same number Will need to do referral

## 2012-12-19 NOTE — Telephone Encounter (Signed)
We will check out in see [facility]

## 2013-01-02 ENCOUNTER — Telehealth: Payer: Self-pay | Admitting: Family Medicine

## 2013-01-02 DIAGNOSIS — H3530 Unspecified macular degeneration: Secondary | ICD-10-CM | POA: Diagnosis not present

## 2013-01-02 DIAGNOSIS — H2510 Age-related nuclear cataract, unspecified eye: Secondary | ICD-10-CM | POA: Diagnosis not present

## 2013-01-16 ENCOUNTER — Other Ambulatory Visit: Payer: Self-pay | Admitting: Family Medicine

## 2013-01-29 ENCOUNTER — Other Ambulatory Visit: Payer: Self-pay | Admitting: Neurology

## 2013-01-30 ENCOUNTER — Encounter: Payer: Self-pay | Admitting: *Deleted

## 2013-02-06 ENCOUNTER — Other Ambulatory Visit (INDEPENDENT_AMBULATORY_CARE_PROVIDER_SITE_OTHER): Payer: Medicare Other

## 2013-02-06 DIAGNOSIS — E559 Vitamin D deficiency, unspecified: Secondary | ICD-10-CM

## 2013-02-06 DIAGNOSIS — R5383 Other fatigue: Secondary | ICD-10-CM

## 2013-02-06 DIAGNOSIS — I10 Essential (primary) hypertension: Secondary | ICD-10-CM | POA: Diagnosis not present

## 2013-02-06 DIAGNOSIS — Z125 Encounter for screening for malignant neoplasm of prostate: Secondary | ICD-10-CM

## 2013-02-06 DIAGNOSIS — E785 Hyperlipidemia, unspecified: Secondary | ICD-10-CM | POA: Diagnosis not present

## 2013-02-06 DIAGNOSIS — R5381 Other malaise: Secondary | ICD-10-CM | POA: Diagnosis not present

## 2013-02-06 LAB — POCT CBC
Granulocyte percent: 76 %G (ref 37–80)
Hemoglobin: 13.4 g/dL — AB (ref 14.1–18.1)
Lymph, poc: 1.3 (ref 0.6–3.4)
MCH, POC: 34.4 pg — AB (ref 27–31.2)
MPV: 7.3 fL (ref 0–99.8)
POC LYMPH PERCENT: 18.8 %L (ref 10–50)
Platelet Count, POC: 235 10*3/uL (ref 142–424)
RBC: 3.9 M/uL — AB (ref 4.69–6.13)

## 2013-02-06 LAB — BASIC METABOLIC PANEL WITH GFR
CO2: 27 mEq/L (ref 19–32)
Chloride: 104 mEq/L (ref 96–112)
GFR, Est Non African American: 54 mL/min — ABNORMAL LOW
Potassium: 4.1 mEq/L (ref 3.5–5.3)

## 2013-02-06 LAB — HEPATIC FUNCTION PANEL
AST: 25 U/L (ref 0–37)
Albumin: 4.1 g/dL (ref 3.5–5.2)
Alkaline Phosphatase: 68 U/L (ref 39–117)
Bilirubin, Direct: 0.2 mg/dL (ref 0.0–0.3)
Indirect Bilirubin: 0.7 mg/dL (ref 0.0–0.9)
Total Bilirubin: 0.9 mg/dL (ref 0.3–1.2)

## 2013-02-06 LAB — PSA: PSA: 2.44 ng/mL (ref <=4.00)

## 2013-02-07 LAB — NMR LIPOPROFILE WITH LIPIDS
Cholesterol, Total: 127 mg/dL (ref <200)
HDL Particle Number: 31.6 umol/L (ref >=30.5)
HDL Size: 9.6 nm (ref >=9.2)
HDL-C: 48 mg/dL (ref >=40)
Large HDL-P: 5.9 umol/L (ref >=4.8)
Large VLDL-P: 1.1 nmol/L (ref <=2.7)
Triglycerides: 59 mg/dL (ref <150)

## 2013-02-07 LAB — VITAMIN D 25 HYDROXY (VIT D DEFICIENCY, FRACTURES): Vit D, 25-Hydroxy: 68 ng/mL (ref 30–89)

## 2013-02-10 NOTE — Telephone Encounter (Signed)
Error

## 2013-02-13 ENCOUNTER — Ambulatory Visit: Payer: Self-pay | Admitting: Family Medicine

## 2013-02-18 DIAGNOSIS — N402 Nodular prostate without lower urinary tract symptoms: Secondary | ICD-10-CM | POA: Diagnosis not present

## 2013-02-18 DIAGNOSIS — C61 Malignant neoplasm of prostate: Secondary | ICD-10-CM | POA: Diagnosis not present

## 2013-02-19 ENCOUNTER — Ambulatory Visit: Payer: Self-pay | Admitting: Family Medicine

## 2013-02-24 ENCOUNTER — Other Ambulatory Visit: Payer: Self-pay | Admitting: Family Medicine

## 2013-02-27 ENCOUNTER — Ambulatory Visit: Payer: Self-pay | Admitting: Family Medicine

## 2013-03-11 ENCOUNTER — Encounter: Payer: Self-pay | Admitting: Family Medicine

## 2013-03-11 ENCOUNTER — Ambulatory Visit (INDEPENDENT_AMBULATORY_CARE_PROVIDER_SITE_OTHER): Payer: Medicare Other | Admitting: Family Medicine

## 2013-03-11 VITALS — BP 120/61 | HR 72 | Temp 97.3°F | Ht 68.5 in | Wt 165.0 lb

## 2013-03-11 DIAGNOSIS — E785 Hyperlipidemia, unspecified: Secondary | ICD-10-CM

## 2013-03-11 DIAGNOSIS — L820 Inflamed seborrheic keratosis: Secondary | ICD-10-CM

## 2013-03-11 DIAGNOSIS — B070 Plantar wart: Secondary | ICD-10-CM | POA: Diagnosis not present

## 2013-03-11 DIAGNOSIS — J309 Allergic rhinitis, unspecified: Secondary | ICD-10-CM

## 2013-03-11 DIAGNOSIS — E559 Vitamin D deficiency, unspecified: Secondary | ICD-10-CM

## 2013-03-11 DIAGNOSIS — G20 Parkinson's disease: Secondary | ICD-10-CM

## 2013-03-11 DIAGNOSIS — C61 Malignant neoplasm of prostate: Secondary | ICD-10-CM

## 2013-03-11 DIAGNOSIS — I10 Essential (primary) hypertension: Secondary | ICD-10-CM

## 2013-03-11 NOTE — Patient Instructions (Signed)
Fall precautions discussed Continue current meds and therapeutic lifestyle changes 

## 2013-03-11 NOTE — Progress Notes (Addendum)
  Subjective:    Patient ID: Keith Hill, male    DOB: [birth year], 79 y.o.   MRN: [id number]  HPI Patient returns to clinic for followup of chronic medical problems and their management. These problems include hypertension hyperlipidemia and Parkinson's disease. He also has vitamin D deficiency. He is to see the neurologist again on August 11 and the urologist on August 14. He also has an appointment with a cardiologist in August. Labs reviewed with patient.  Review of Systems  Constitutional: Negative.   HENT: Positive for sneezing and postnasal drip (due to allergies, intermitently). Negative for ear pain and sore throat.   Eyes: Negative.   Respiratory: Negative.   Cardiovascular: Negative.   Gastrointestinal: Positive for constipation (intermitently).  Endocrine: Negative.   Musculoskeletal: Positive for back pain (mid thoracic).  Skin: Negative.   Allergic/Immunologic: Positive for environmental allergies (year around).  Neurological: Positive for tremors. Negative for weakness and headaches.  Psychiatric/Behavioral: Negative.        Objective:   Physical Exam  BP 120/61  Pulse 72  Temp(Src) 97.3 F (36.3 C) (Oral)  Ht 5' 8.5" (1.74 m)  Wt 165 lb (74.844 kg)  BMI 24.72 kg/m2  The patient appeared well nourished with tremor, and normally developed, alert and oriented to time and place. Speech, behavior and judgement appear normal. Vital signs as documented.  Head exam is unremarkable. No scleral icterus or pallor noted. Ears nose and throat were within normal limits.  Neck is without jugular venous distension, thyromegally, or carotid bruits. Carotid upstrokes are brisk bilaterally. No cervical adenopathy. Lungs are clear anteriorly and posteriorly to auscultation. Normal respiratory effort. Cardiac exam reveals regular rate and rhythm at 72 per minute. First and second heart sounds normal.  No murmurs, rubs or gallops.  Abdominal exam reveals normal bowl sounds, no  masses, no organomegaly and no aortic enlargement. No inguinal adenopathy. Extremities are nonedematous and both femoral and pedal pulses are normal. Persistent tremor in both hands right greater than left. Skin without pallor or jaundice.  Warm and dry, without rash. Neurologic exam reveals normal deep tendon reflexes and normal sensation. Tremor as noted above.         Assessment & Plan:  1. Hypertension  2. Hyperlipemia  3. Vitamin D deficiency  4. Parkinson disease - Ambulatory referral to Neurology  5. Other and unspecified hyperlipidemia  6. Allergic rhinitis,   7. Parkinson's -Followed by Dr. Anne Hahn  8. Prostate cancer -Followed by Dr. Annabell Howells   Patient Instructions  Fall precautions discussed Continue current meds and therapeutic lifestyle changes   Keep appointments as discussed  Nyra Capes MD  This is an addendum to the above note. Patient requested treatment of lesions on his right wrist and left antecubital fossa. The lesion on the right wrist was a wart that was growing and become irritated and the 2 lesions on the left antecubital fossa were irritated seborrheic keratoses. These were treated with cryotherapy at patient's request . He tolerated the procedure well and was then discharged from the visit.  Nyra Capes MD

## 2013-03-31 ENCOUNTER — Ambulatory Visit (INDEPENDENT_AMBULATORY_CARE_PROVIDER_SITE_OTHER): Payer: Medicare Other | Admitting: Neurology

## 2013-03-31 ENCOUNTER — Encounter: Payer: Self-pay | Admitting: Neurology

## 2013-03-31 VITALS — BP 123/69 | HR 85 | Temp 98.0°F | Ht 69.0 in | Wt 166.0 lb

## 2013-03-31 DIAGNOSIS — G252 Other specified forms of tremor: Secondary | ICD-10-CM | POA: Diagnosis not present

## 2013-03-31 DIAGNOSIS — G20 Parkinson's disease: Secondary | ICD-10-CM | POA: Diagnosis not present

## 2013-03-31 DIAGNOSIS — G250 Essential tremor: Secondary | ICD-10-CM | POA: Diagnosis not present

## 2013-03-31 MED ORDER — RASAGILINE MESYLATE 1 MG PO TABS: 1.0000 mg | ORAL_TABLET | Freq: Every day | ORAL | Status: DC

## 2013-03-31 NOTE — Progress Notes (Signed)
Reason for visit: Parkinson's disease  Keith Hill is an 79 y.o. male  History of present illness:  Keith Hill is a 79 year old right-handed white male with a history of Parkinson's disease. The patient has a resting tremor on the right arm, and he also has an essential tremor affecting both arms. The patient is on Sinemet taking the 25/100 mg tablet, one half tablet 3 times daily. The patient has not gained any improvement with the tremor on this medication. The patient takes Benadryl at night for sleep, taking one half of a 25 mg tablet. The patient continues to remain quite active, working out several times a week, and playing golf 3 times a week. The patient denies any balance issues, and no falls. The patient denies problems with swallowing. The patient returns for an evaluation.  Past Medical History  Diagnosis Date  . Pancreatitis 1976  . Lumbago   . Pain in joint, multiple sites   . Other and unspecified hyperlipidemia   . Hyperplasia of prostate   . Essential hypertension, benign   . Cardiac dysrhythmia, unspecified   . Allergic rhinitis, cause unspecified   . Colon polyps   . Hemorrhoids, external   . Hiatal hernia   . GERD (gastroesophageal reflux disease)   . Arthritis   . Hyperlipemia   . Hypertension   . Essential and other specified forms of tremor   . Other general symptoms(780.99)   . Paralysis agitans 11/07/2012    Past Surgical History  Procedure Laterality Date  . Leg surgery    . Shoulder surgery    . Hemorrhoid surgery      Family History  Problem Relation Age of Onset  . Stroke Father   . Kidney failure Father   . Alcoholism Brother   . Heart attack Brother     Social history:  reports that he has never smoked. He has never used smokeless tobacco. He reports that he does not drink alcohol or use illicit drugs.  Allergies:  Allergies  Allergen Reactions  . Aspirin     Upsets stomach  . Aspirin Free Childs (Acetaminophen)   . Calicum  Glycerophosphate   . Niaspan (Niacin Er)   . Prednisone   . Zocor (Simvastatin)     Medications:  Current Outpatient Prescriptions on File Prior to Visit  Medication Sig Dispense Refill  . amLODipine (NORVASC) 10 MG tablet 10 mg. One half tab po daily      . carbidopa-levodopa (SINEMET IR) 25-100 MG per tablet TAKE (1/2) TABLET THREE TIMES DAILY.  45 tablet  3  . Cholecalciferol (VITAMIN D3) 5000 UNITS CAPS Take 1 capsule by mouth 4 (four) times a week.       . diphenhydrAMINE (BENADRYL) 25 MG tablet Take 25 mg by mouth at bedtime as needed for itching (1/2 tablet).      Marland Kitchen docusate sodium (COLACE) 100 MG capsule Take 100 mg by mouth 2 (two) times daily. daily      . fluticasone (FLONASE) 50 MCG/ACT nasal spray       . Folic Acid-Vit B6-Vit B12 (FOLGARD RX 2.2 PO) Take 1 tablet by mouth daily.        . Probiotic Product (PHILLIPS COLON HEALTH PO) Take by mouth 1 dose over 24 hours.        Marland Kitchen terazosin (HYTRIN) 1 MG capsule Take 1 mg by mouth daily.      . valsartan-hydrochlorothiazide (DIOVAN-HCT) 320-12.5 MG per tablet Take 1/4 tablet daily  No current facility-administered medications on file prior to visit.    ROS:  Out of a complete 14 system review of symptoms, the patient complains only of the following symptoms, and all other reviewed systems are negative.  Tremor  Blood pressure 123/69, pulse 85, temperature 98 F (36.7 C), temperature source Oral, height 5\' 9"  (1.753 m), weight 166 lb (75.297 kg).  Physical Exam  General: The patient is alert and cooperative at the time of the examination.  Skin: No significant peripheral edema is noted.   Neurologic Exam  Cranial nerves: Facial symmetry is not present. The patient has mild right-sided ptosis. Speech is normal, no aphasia or dysarthria is noted. Extraocular movements are full. Visual fields are full. Masking of the face is seen.  Motor: The patient has good strength in all 4 extremities.  Coordination: The  patient has good finger-nose-finger and heel-to-shin bilaterally. The patient has an intention tremor with finger-nose-finger bilaterally, and a resting tremor of the right upper extremity.  Gait and station: The patient has a normal gait. The patient is able to arise from a seated position with arms crossed. With ambulation, the patient has a tremor involving the right arm, decreased arm swing. Tandem gait is normal. Romberg is negative. No drift is seen.  Reflexes: Deep tendon reflexes are symmetric.   Assessment/Plan:  One. Parkinson's disease  2. Essential tremor  The patient has 2 types of tremor, he has a symmetric essential tremor with finger-nose-finger, and an asymmetric resting tremor with his Parkinson's disease. The patient will be placed on Azilect, samples were given. The patient will try Benadryl taking one half tablet 2 or 3 times daily, but if this causes too much drowsiness, he will contact our office, and we will consider Cogentin or Artane for the tremor. The patient will followup in 4 months. The patient is to remain active.  Keith Palau MD 03/31/2013 7:21 PM  [facility] [address] [place], [place] [address]  Phone [phone] Fax [phone]

## 2013-04-03 DIAGNOSIS — C61 Malignant neoplasm of prostate: Secondary | ICD-10-CM | POA: Diagnosis not present

## 2013-04-03 DIAGNOSIS — R972 Elevated prostate specific antigen [PSA]: Secondary | ICD-10-CM | POA: Diagnosis not present

## 2013-04-03 HISTORY — PX: PROSTATE BIOPSY: SHX241

## 2013-04-08 ENCOUNTER — Encounter: Payer: Self-pay | Admitting: Cardiology

## 2013-04-08 DIAGNOSIS — E785 Hyperlipidemia, unspecified: Secondary | ICD-10-CM | POA: Diagnosis not present

## 2013-04-08 DIAGNOSIS — I2510 Atherosclerotic heart disease of native coronary artery without angina pectoris: Secondary | ICD-10-CM | POA: Diagnosis not present

## 2013-04-08 DIAGNOSIS — I10 Essential (primary) hypertension: Secondary | ICD-10-CM | POA: Diagnosis not present

## 2013-04-10 ENCOUNTER — Other Ambulatory Visit: Payer: Self-pay | Admitting: Family Medicine

## 2013-04-22 ENCOUNTER — Encounter: Payer: Self-pay | Admitting: Radiation Oncology

## 2013-04-22 NOTE — Progress Notes (Signed)
GU Location of Tumor / Histology: adenocarcinoma of the prostate   If Prostate Cancer, Gleason Score is (3 + 3=6) and PSA is (2.4)  Patient has been under active surveillance since 2013 for prostate cancer.  Biopsies of prostate (if applicable) revealed:     Past/Anticipated interventions by urology, if any: office follow up  Past/Anticipated interventions by medical oncology, if any: None  Weight changes, if any: no recent unintentional weight loss   Bowel/Bladder complaints, if any: reports urgency, denies dysuria, denies hematuria, reports urgency urinary incontinence, reports nocturia   Nausea/Vomiting, if any: None noted  Pain issues, if any:  Denies bone pain  SAFETY ISSUES:  Prior radiation? NO  Pacemaker/ICD? NO  Possible current pregnancy? NO  Is the patient on methotrexate? NO  Current Complaints / other details:  79 year old male. Initial prostate biopsy done 11/01/2011 and repeat biopsy done 04/03/2013. Prostate volume 47.34 cc.

## 2013-04-23 ENCOUNTER — Ambulatory Visit
Admission: RE | Admit: 2013-04-23 | Discharge: 2013-04-23 | Disposition: A | Discharge status: Home / Self Care | Payer: Medicare Other | Source: Ambulatory Visit | Attending: Radiation Oncology | Admitting: Radiation Oncology

## 2013-04-23 ENCOUNTER — Encounter: Payer: Self-pay | Admitting: Radiation Oncology

## 2013-04-23 VITALS — BP 129/59 | HR 72 | Temp 98.2°F | Resp 16 | Ht 68.0 in | Wt 165.5 lb

## 2013-04-23 DIAGNOSIS — C61 Malignant neoplasm of prostate: Secondary | ICD-10-CM

## 2013-04-23 DIAGNOSIS — E785 Hyperlipidemia, unspecified: Secondary | ICD-10-CM | POA: Diagnosis not present

## 2013-04-23 DIAGNOSIS — G20 Parkinson's disease: Secondary | ICD-10-CM | POA: Insufficient documentation

## 2013-04-23 DIAGNOSIS — Z79899 Other long term (current) drug therapy: Secondary | ICD-10-CM | POA: Insufficient documentation

## 2013-04-23 DIAGNOSIS — G20A1 Parkinson's disease without dyskinesia, without mention of fluctuations: Secondary | ICD-10-CM | POA: Insufficient documentation

## 2013-04-23 DIAGNOSIS — I10 Essential (primary) hypertension: Secondary | ICD-10-CM | POA: Diagnosis not present

## 2013-04-23 HISTORY — DX: Parkinson's disease without dyskinesia, without mention of fluctuations: G20.A1

## 2013-04-23 HISTORY — DX: Induration penis plastica: N48.6

## 2013-04-23 HISTORY — DX: Parkinson's disease: G20

## 2013-04-23 NOTE — Progress Notes (Signed)
Radiation Oncology         [phone] ________________________________  Initial outpatient Consultation  Name: Keith Hill MRN: [id number]  Date: 04/23/2013  DOB: March 01, 1932  JX:[id number], Keith Hill, MD  Anner Crete, MD   REFERRING PHYSICIAN: Anner Crete, MD  DIAGNOSIS: 79 y.o. gentleman with stage T2a adenocarcinoma of the prostate with a Gleason's score of 3+3 and a PSA of 2.4  HISTORY OF PRESENT ILLNESS::Keith Hill is a 79 y.o. gentleman.  He was noted to have a right apical prostate nodule and a rising PSA up to 2.4 by his primary care physician, Dr. Christell Constant.  Accordingly, he was referred for evaluation in urology by Dr. Annabell Howells on 10/03/11,  digital rectal examination was performed at that time confirming a 2+ gland with a right apical nodule.  The patient proceeded to transrectal ultrasound with 12 biopsies of the prostate on 11/01/11.  The prostate volume measured 41 cc.  Out of 12 core biopsies, one was positive.  The maximum Gleason score was 3+3, and this was seen in 30% of the right apex.  The patient reviewed the biopsy results with his urologist and elected to pursue active surveillance.  PSA remained essentially stable.  Follow-up TRUS for biopsy was done on 04/03/13.  The volume measured 47 cc.  Two out of 12 cores were positive with Gleason's 3+3.  He has kindly been referred today for discussion of potential radiation treatment options versus continued surveillance.   PREVIOUS RADIATION THERAPY: No  PAST MEDICAL HISTORY:  has a past medical history of Pancreatitis (1976); Lumbar disc disease; Hyperlipidemia; Hyperplasia of prostate; Cardiac dysrhythmia, unspecified; Allergic rhinitis, cause unspecified; Colon polyps; Hemorrhoids, external; Hiatal hernia; GERD (gastroesophageal reflux disease); Hypertension; Essential and other specified forms of tremor; Paralysis agitans (11/07/2012); Prostate cancer; Parkinson disease; and Peyronie's disease.    PAST SURGICAL HISTORY: Past  Surgical History  Procedure Laterality Date  . Knee surgery    . Shoulder surgery Left   . Hemorrhoid surgery    . Prostate biopsy  04/03/2013  . Prostate biopsy  11/01/2011    FAMILY HISTORY: family history includes Alcoholism in his brother; Heart attack in his brother; Kidney failure in his father; Stroke in his father. There is no history of Cancer.  SOCIAL HISTORY:  reports that he has never smoked. He has never used smokeless tobacco. He reports that he does not drink alcohol or use illicit drugs.  ALLERGIES: Aspirin; Aspirin free childs; Calicum glycerophosphate; Niaspan; Prednisone; and Zocor  MEDICATIONS:  Current Outpatient Prescriptions  Medication Sig Dispense Refill  . amLODipine (NORVASC) 10 MG tablet 10 mg. One half tab po daily      . atorvastatin (LIPITOR) 40 MG tablet       . carbidopa-levodopa (SINEMET IR) 25-100 MG per tablet TAKE (1/2) TABLET THREE TIMES DAILY.  45 tablet  3  . Cholecalciferol (VITAMIN D3) 5000 UNITS CAPS Take 1 capsule by mouth 4 (four) times a week.       . diphenhydrAMINE (BENADRYL) 25 MG tablet Take 25 mg by mouth at bedtime as needed for itching (1/2 tablet).      Marland Kitchen docusate sodium (COLACE) 100 MG capsule Take 100 mg by mouth 2 (two) times daily. daily      . fluticasone (FLONASE) 50 MCG/ACT nasal spray       . Folic Acid-Vit B6-Vit B12 (FOLGARD RX 2.2 PO) Take 1 tablet by mouth daily.        Marland Kitchen omega-3 acid ethyl esters (LOVAZA)  1 G capsule Take 2 g by mouth 2 (two) times daily.      . Probiotic Product (PHILLIPS COLON HEALTH PO) Take by mouth 1 dose over 24 hours.        . rasagiline (AZILECT) 1 MG TABS tablet Take 1 tablet (1 mg total) by mouth daily.  30 tablet  5  . terazosin (HYTRIN) 1 MG capsule Take 1 mg by mouth daily.      Marland Kitchen terazosin (HYTRIN) 2 MG capsule TAKE 1 CAPSULE AT BEDTIME  90 capsule  1  . valsartan-hydrochlorothiazide (DIOVAN-HCT) 320-12.5 MG per tablet Take 1/4 tablet daily       No current facility-administered  medications for this encounter.    REVIEW OF SYSTEMS:  A 15 point review of systems is documented in the electronic medical record. This was obtained by the nursing staff. However, I reviewed this with the patient to discuss relevant findings and make appropriate changes.  A comprehensive review of systems was negative..  The patient completed an IPSS and IIEF questionnaire.  His IPSS score was 6 indicating mild urinary outflow obstructive symptoms.  He indicated that his erectile function is not able to complete sexual activity.   PHYSICAL EXAM: This patient is in no acute distress.  He is alert and oriented.   height is 5\' 8"  (1.727 m) and weight is 165 lb 8 oz (75.07 kg). His oral temperature is 98.2 F (36.8 C). His blood pressure is 129/59 and his pulse is 72. His respiration is 16 and oxygen saturation is 100%.  He exhibits no respiratory distress or labored breathing.  He appears neurologically intact.  His mood is pleasant.  His affect is appropriate.  Please note the digital rectal exam findings described above.  KPS = 100 - Normal; no complaints; no evidence of disease.  LABORATORY DATA:  Lab Results  Component Value Date   WBC 7.1 02/06/2013   HGB 13.4* 02/06/2013   HCT 38.4* 02/06/2013   MCV 98.7* 02/06/2013   PLT 243 01/23/2011   Lab Results  Component Value Date   NA 140 02/06/2013   K 4.1 02/06/2013   CL 104 02/06/2013   CO2 27 02/06/2013   Lab Results  Component Value Date   ALT 33 02/06/2013   AST 25 02/06/2013   ALKPHOS 68 02/06/2013   BILITOT 0.9 02/06/2013     RADIOGRAPHY: No results found.    IMPRESSION: This gentleman is a 79 y.o. gentleman with stage T2a adenocarcinoma of the prostate with a Gleason's score of 3+3 and a PSA of 2.4.  His T-Stage, Gleason's Score, and PSA put him into the favorable risk group.  Accordingly he is eligible for a variety of potential treatment options including continued active surveillance, radical prostatectomy, external beam radiotherapy,  and prostate seed implant.  PLAN:Today I reviewed the findings and workup thus far.  We discussed the natural history of prostate cancer.  We reviewed the the implications of T-stage, Gleason's Score, and PSA on decision-making and outcomes in prostate cancer.  We discussed radiation treatment in the management of prostate cancer with regard to the logistics and delivery of external beam radiation treatment as well as the logistics and delivery of prostate brachytherapy.  We compared and contrasted each of these approaches and also compared these against prostatectomy.  The patient expressed interest in prostate brachytherapy vs. continued surveillance.  I filled out a patient counseling form for him with relevant treatment diagrams and we retained a copy for our records.  The patient would like to proceed with continued surveillance.  I will share my findings with Dr. Annabell Howells and be happy to see him again in the future should he elect to pursue seed implant.     I enjoyed meeting with him today, and will look forward to participating in the care of this very nice gentleman.   I spent 60 minutes face to face with the patient and more than 50% of that time was spent in counseling and/or coordination of care.   ------------------------------------------------  Artist Pais. Kathrynn Running, M.D.

## 2013-04-23 NOTE — Progress Notes (Signed)
See progress note under physician encounter. 

## 2013-04-23 NOTE — Progress Notes (Signed)
Reports on average he gets up twice during the night to void but, goes on to say he has done this since childhood. Denies dysuria or hematuria. Reports urgency and urgency urinary incontinence. Denies unintentional weight loss or night sweats. Denies nausea, vomiting, headache, or dizziness. Remains active playing golf regularly. Denies diarrhea, blood in stool, and pain associated with bowel movements. IPSS 6 noted. Denies pain.

## 2013-04-28 DIAGNOSIS — G20 Parkinson's disease: Secondary | ICD-10-CM | POA: Diagnosis not present

## 2013-05-29 ENCOUNTER — Ambulatory Visit: Payer: Medicare Other

## 2013-05-29 ENCOUNTER — Ambulatory Visit (INDEPENDENT_AMBULATORY_CARE_PROVIDER_SITE_OTHER): Payer: Medicare Other

## 2013-05-29 DIAGNOSIS — Z23 Encounter for immunization: Secondary | ICD-10-CM

## 2013-08-02 ENCOUNTER — Other Ambulatory Visit: Payer: Self-pay | Admitting: Family Medicine

## 2013-08-22 ENCOUNTER — Other Ambulatory Visit (INDEPENDENT_AMBULATORY_CARE_PROVIDER_SITE_OTHER): Payer: Medicare Other

## 2013-08-22 DIAGNOSIS — E785 Hyperlipidemia, unspecified: Secondary | ICD-10-CM | POA: Diagnosis not present

## 2013-08-22 DIAGNOSIS — I10 Essential (primary) hypertension: Secondary | ICD-10-CM | POA: Diagnosis not present

## 2013-08-22 DIAGNOSIS — E559 Vitamin D deficiency, unspecified: Secondary | ICD-10-CM | POA: Diagnosis not present

## 2013-08-22 LAB — POCT CBC
GRANULOCYTE PERCENT: 79.4 % (ref 37–80)
HCT, POC: 39.8 % — AB (ref 43.5–53.7)
Hemoglobin: 13.6 g/dL — AB (ref 14.1–18.1)
LYMPH, POC: 1.5 (ref 0.6–3.4)
MCH, POC: 32.9 pg — AB (ref 27–31.2)
MCHC: 34 g/dL (ref 31.8–35.4)
MCV: 96.6 fL (ref 80–97)
MPV: 6.5 fL (ref 0–99.8)
PLATELET COUNT, POC: 275 10*3/uL (ref 142–424)
POC GRANULOCYTE: 6.5 (ref 2–6.9)
POC LYMPH %: 18.9 % (ref 10–50)
RBC: 4.1 M/uL — AB (ref 4.69–6.13)
RDW, POC: 12.5 %
WBC: 8.2 10*3/uL (ref 4.6–10.2)

## 2013-08-22 NOTE — Progress Notes (Signed)
Pt came in for labs only 

## 2013-08-24 LAB — NMR, LIPOPROFILE
Cholesterol: 118 mg/dL (ref <200)
HDL CHOLESTEROL BY NMR: 50 mg/dL (ref >=40)
HDL PARTICLE NUMBER: 31.1 umol/L (ref >=30.5)
LDL Particle Number: 659 nmol/L (ref <1000)
LDL Size: 20.6 nm (ref >20.5)
LDLC SERPL CALC-MCNC: 54 mg/dL (ref <100)
LP-IR Score: 30 (ref <=45)
SMALL LDL PARTICLE NUMBER: 444 nmol/L (ref <=527)
TRIGLYCERIDES BY NMR: 69 mg/dL (ref <150)

## 2013-08-24 LAB — BMP8+EGFR
BUN/Creatinine Ratio: 13 (ref 10–22)
BUN: 15 mg/dL (ref 8–27)
CALCIUM: 9.6 mg/dL (ref 8.6–10.2)
CO2: 22 mmol/L (ref 18–29)
CREATININE: 1.16 mg/dL (ref 0.76–1.27)
Chloride: 100 mmol/L (ref 97–108)
GFR calc Af Amer: 69 mL/min/{1.73_m2} (ref >59)
GFR calc non Af Amer: 60 mL/min/{1.73_m2} (ref >59)
GLUCOSE: 95 mg/dL (ref 65–99)
Potassium: 4 mmol/L (ref 3.5–5.2)
Sodium: 140 mmol/L (ref 134–144)

## 2013-08-24 LAB — VITAMIN D 25 HYDROXY (VIT D DEFICIENCY, FRACTURES): Vit D, 25-Hydroxy: 52.2 ng/mL (ref 30.0–100.0)

## 2013-08-24 LAB — HEPATIC FUNCTION PANEL
ALBUMIN: 4.2 g/dL (ref 3.5–4.8)
ALT: 34 IU/L (ref 0–44)
AST: 24 IU/L (ref 0–40)
Alkaline Phosphatase: 89 IU/L (ref 39–117)
BILIRUBIN TOTAL: 0.9 mg/dL (ref 0.0–1.2)
Bilirubin, Direct: 0.24 mg/dL (ref 0.00–0.40)
TOTAL PROTEIN: 7.2 g/dL (ref 6.0–8.5)

## 2013-08-28 ENCOUNTER — Ambulatory Visit (INDEPENDENT_AMBULATORY_CARE_PROVIDER_SITE_OTHER): Payer: Medicare Other | Admitting: Family Medicine

## 2013-08-28 ENCOUNTER — Ambulatory Visit (INDEPENDENT_AMBULATORY_CARE_PROVIDER_SITE_OTHER): Payer: Medicare Other

## 2013-08-28 ENCOUNTER — Encounter: Payer: Self-pay | Admitting: Family Medicine

## 2013-08-28 VITALS — BP 134/62 | HR 70 | Temp 97.4°F | Ht 68.0 in | Wt 167.0 lb

## 2013-08-28 DIAGNOSIS — E559 Vitamin D deficiency, unspecified: Secondary | ICD-10-CM

## 2013-08-28 DIAGNOSIS — K219 Gastro-esophageal reflux disease without esophagitis: Secondary | ICD-10-CM | POA: Insufficient documentation

## 2013-08-28 DIAGNOSIS — I10 Essential (primary) hypertension: Secondary | ICD-10-CM

## 2013-08-28 DIAGNOSIS — G20 Parkinson's disease: Secondary | ICD-10-CM | POA: Diagnosis not present

## 2013-08-28 DIAGNOSIS — E785 Hyperlipidemia, unspecified: Secondary | ICD-10-CM

## 2013-08-28 DIAGNOSIS — I2510 Atherosclerotic heart disease of native coronary artery without angina pectoris: Secondary | ICD-10-CM | POA: Diagnosis not present

## 2013-08-28 DIAGNOSIS — C61 Malignant neoplasm of prostate: Secondary | ICD-10-CM

## 2013-08-28 DIAGNOSIS — Z23 Encounter for immunization: Secondary | ICD-10-CM

## 2013-08-28 DIAGNOSIS — K449 Diaphragmatic hernia without obstruction or gangrene: Secondary | ICD-10-CM

## 2013-08-28 NOTE — Patient Instructions (Addendum)
Continue current medications. Continue good therapeutic lifestyle changes which include good diet and exercise. Fall precautions discussed with patient. Schedule your flu vaccine if you haven't had it yet If you are over 78 years old - you may need Prevnar 44 or the adult Pneumonia vaccine. Return to clinic as planned and get your PSA for Dr. Ralene Muskrat visit. Continue to exercise as you're doing This winter drink plenty of fluids and use a cool mist humidifier in her bedroom at night

## 2013-08-28 NOTE — Progress Notes (Addendum)
Subjective:    Patient ID: STEPHANIE MCGLONE, male    DOB: [birth year], 80 y.o.   MRN: [id number]  HPI Pt here for follow up and management of chronic medical problems. The patient is doing fairly well. He is followed by the urologist, the cardiologist, and the neurologist. Health maintenance issues he is due FOBT, a chest x-ray and a Prevnar vaccine. His recent lab work will be reviewed with him during the visit . She continues to see Dr. Jannifer Franklin has an appointment with him sometime in February. He also has followup with Dr. Jeffie Pollock in February for his prostate cancer he will get a PSA prior to this visit. He has not seen a cardiologist until August. He seems to be more at peace with his diagnosis of Parkinson's and stays active with exercise and physical therapy. He appears to be calm today and nondistressed about this diagnosis.        Patient Active Problem List   Diagnosis Date Noted  . CAD (coronary artery disease) 04/08/2013  . Essential and other specified forms of tremor 03/31/2013  . Parkinson's 11/07/2012  . Hemorrhoids 02/17/2011  . Hyperlipidemia   . Prostate cancer   . Hypertension   . Allergic rhinitis   . Colon polyps, history of    Outpatient Encounter Prescriptions as of 08/28/2013  Medication Sig  . amLODipine (NORVASC) 10 MG tablet 10 mg. One half tab po daily  . atorvastatin (LIPITOR) 40 MG tablet Take 40 mg by mouth daily at 6 PM. As directed  . carbidopa-levodopa (SINEMET IR) 25-100 MG per tablet TAKE one TABLET THREE TIMES DAILY.  Marland Kitchen Cholecalciferol (VITAMIN D3) 5000 UNITS CAPS Take 1 capsule by mouth 4 (four) times a week.   . diphenhydrAMINE (BENADRYL) 25 MG tablet Take 25 mg by mouth at bedtime as needed for itching (1/2 tablet).  Marland Kitchen docusate sodium (COLACE) 100 MG capsule Take 100 mg by mouth 2 (two) times daily. daily  . fluticasone (FLONASE) 50 MCG/ACT nasal spray INHALE 1 SPRAYS INTO EACH NOSTRIL ONCE DAILY  . Folic Acid-Vit O3-ZCH Y85 (FOLGARD RX 2.2 PO) Take 1  tablet by mouth daily.    Marland Kitchen omega-3 acid ethyl esters (LOVAZA) 1 G capsule Take 2 g by mouth 2 (two) times daily.  . Probiotic Product ([place]) Take by mouth 1 dose over 24 hours.    . rasagiline (AZILECT) 1 MG TABS tablet Take 1 tablet (1 mg total) by mouth daily.  Marland Kitchen terazosin (HYTRIN) 2 MG capsule TAKE 1 CAPSULE AT BEDTIME  . valsartan-hydrochlorothiazide (DIOVAN-HCT) 320-12.5 MG per tablet Take 1/4 tablet daily  . [DISCONTINUED] carbidopa-levodopa (SINEMET IR) 25-100 MG per tablet TAKE (1/2) TABLET THREE TIMES DAILY.  . [DISCONTINUED] terazosin (HYTRIN) 1 MG capsule Take 1 mg by mouth daily.    Review of Systems  Constitutional: Negative.   HENT: Negative.   Eyes: Negative.   Respiratory: Negative.   Cardiovascular: Negative.   Gastrointestinal: Negative.   Endocrine: Negative.   Genitourinary: Negative.   Musculoskeletal: Negative.   Skin: Negative.   Allergic/Immunologic: Negative.   Neurological: Negative.   Hematological: Negative.   Psychiatric/Behavioral: Negative.        Objective:   Physical Exam  Nursing note and vitals reviewed. Constitutional: He is oriented to person, place, and time. He appears well-developed and well-nourished. No distress.  Pleasant and calm  HENT:  Head: Normocephalic and atraumatic.  Left Ear: External ear normal.  Nose: Nose normal.  Mouth/Throat: Oropharynx is clear and  moist. No oropharyngeal exudate.  Ear cerumen right EAC  Eyes: Conjunctivae and EOM are normal. Pupils are equal, round, and reactive to light. Right eye exhibits no discharge. Left eye exhibits no discharge. No scleral icterus.  Neck: Normal range of motion. Neck supple. No thyromegaly present.  Cardiovascular: Normal rate, regular rhythm and normal heart sounds.  Exam reveals no gallop and no friction rub.   No murmur heard. At 72 per minute, decreased pedal pulses today  Pulmonary/Chest: Effort normal and breath sounds normal. No respiratory  distress. He has no wheezes. He has no rales. He exhibits no tenderness.  Abdominal: Soft. Bowel sounds are normal. He exhibits no distension and no mass. There is no tenderness. There is no rebound and no guarding.  Musculoskeletal: Normal range of motion. He exhibits no edema and no tenderness.  Lymphadenopathy:    He has no cervical adenopathy.  Neurological: He is alert and oriented to person, place, and time. He has normal reflexes.  Skin: Skin is warm and dry. No rash noted. No erythema. No pallor.  Psychiatric: He has a normal mood and affect. His behavior is normal. Judgment and thought content normal.   BP 134/62  Pulse 70  Temp(Src) 97.4 F (36.3 C) (Oral)  Ht 5\' 8"  (1.727 m)  Wt 167 lb (75.751 kg)  BMI 25.40 kg/m2  [facility] reading (PRIMARY) by  Dr. Brunilda Payor x-ray; --atherosclerotic changes but no active cardiopulmonary disease                                       Assessment & Plan:  1. CAD (coronary artery disease) - DG Chest 2 View; Future  2. Hyperlipidemia  3. Hypertension - DG Chest 2 View; Future  4. Parkinson's -Followup with Dr. Jannifer Franklin as planned  5. Prostate cancer -Future PSA and visit with Dr. Jeffie Pollock  6. Vitamin D deficiency  7. Hiatal hernia with gastroesophageal reflux   Meds ordered this encounter  Medications  . carbidopa-levodopa (SINEMET IR) 25-100 MG per tablet    Sig: TAKE one TABLET THREE TIMES DAILY.   Patient Instructions  Continue current medications. Continue good therapeutic lifestyle changes which include good diet and exercise. Fall precautions discussed with patient. Schedule your flu vaccine if you haven't had it yet If you are over 10 years old - you may need Prevnar 2 or the adult Pneumonia vaccine. Return to clinic as planned and get your PSA for Dr. Ralene Muskrat visit. Continue to exercise as you're doing This winter drink plenty of fluids and use a cool mist humidifier in her bedroom at night   Arrie Senate MD

## 2013-08-28 NOTE — Addendum Note (Signed)
Addended by: Zannie Cove on: 08/28/2013 12:53 PM   Modules accepted: Orders

## 2013-09-03 ENCOUNTER — Other Ambulatory Visit (INDEPENDENT_AMBULATORY_CARE_PROVIDER_SITE_OTHER): Payer: Medicare Other

## 2013-09-03 ENCOUNTER — Other Ambulatory Visit: Payer: Self-pay | Admitting: Family Medicine

## 2013-09-03 DIAGNOSIS — Z1212 Encounter for screening for malignant neoplasm of rectum: Secondary | ICD-10-CM

## 2013-09-04 LAB — FECAL OCCULT BLOOD, IMMUNOCHEMICAL: FECAL OCCULT BLD: NEGATIVE

## 2013-09-16 ENCOUNTER — Other Ambulatory Visit (INDEPENDENT_AMBULATORY_CARE_PROVIDER_SITE_OTHER): Payer: Medicare Other

## 2013-09-16 DIAGNOSIS — Z125 Encounter for screening for malignant neoplasm of prostate: Secondary | ICD-10-CM | POA: Diagnosis not present

## 2013-09-16 DIAGNOSIS — M19049 Primary osteoarthritis, unspecified hand: Secondary | ICD-10-CM | POA: Diagnosis not present

## 2013-09-17 LAB — PSA, TOTAL AND FREE
PSA, Free Pct: 26.8 %
PSA, Free: 0.67 ng/mL
PSA: 2.5 ng/mL (ref 0.0–4.0)

## 2013-09-23 DIAGNOSIS — C61 Malignant neoplasm of prostate: Secondary | ICD-10-CM | POA: Diagnosis not present

## 2013-09-23 DIAGNOSIS — N402 Nodular prostate without lower urinary tract symptoms: Secondary | ICD-10-CM | POA: Diagnosis not present

## 2013-10-07 ENCOUNTER — Other Ambulatory Visit: Payer: Self-pay | Admitting: Neurology

## 2013-10-07 ENCOUNTER — Other Ambulatory Visit: Payer: Self-pay | Admitting: Family Medicine

## 2013-10-08 ENCOUNTER — Encounter: Payer: Self-pay | Admitting: *Deleted

## 2013-10-13 ENCOUNTER — Other Ambulatory Visit: Payer: Self-pay | Admitting: Family Medicine

## 2013-10-17 ENCOUNTER — Ambulatory Visit (INDEPENDENT_AMBULATORY_CARE_PROVIDER_SITE_OTHER): Payer: Medicare Other | Admitting: Neurology

## 2013-10-17 ENCOUNTER — Encounter: Payer: Self-pay | Admitting: Neurology

## 2013-10-17 ENCOUNTER — Encounter (INDEPENDENT_AMBULATORY_CARE_PROVIDER_SITE_OTHER): Payer: Self-pay

## 2013-10-17 VITALS — BP 115/65 | HR 74 | Ht 67.5 in | Wt 165.0 lb

## 2013-10-17 DIAGNOSIS — I2510 Atherosclerotic heart disease of native coronary artery without angina pectoris: Secondary | ICD-10-CM | POA: Diagnosis not present

## 2013-10-17 DIAGNOSIS — G20 Parkinson's disease: Secondary | ICD-10-CM | POA: Diagnosis not present

## 2013-10-17 DIAGNOSIS — G250 Essential tremor: Secondary | ICD-10-CM | POA: Diagnosis not present

## 2013-10-17 DIAGNOSIS — G252 Other specified forms of tremor: Secondary | ICD-10-CM

## 2013-10-17 MED ORDER — RASAGILINE MESYLATE 1 MG PO TABS: 1.0000 mg | ORAL_TABLET | Freq: Every day | ORAL | Status: DC

## 2013-10-17 MED ORDER — CARBIDOPA-LEVODOPA 25-100 MG PO TABS: 1.0000 | ORAL_TABLET | Freq: Three times a day (TID) | ORAL | Status: DC

## 2013-10-17 NOTE — Patient Instructions (Signed)

## 2013-10-17 NOTE — Progress Notes (Signed)
Reason for visit: Parkinson's disease  Keith Hill is an 80 y.o. male  History of present illness:  Keith Hill is an 80 year old right-handed white male with a history of Parkinson's disease. The patient indicates that he has done relatively well over the last several months. The patient was seen through [facility], and had a second opinion regarding his Parkinson's disease. The patient returns at this point for further evaluation. The patient is currently on Sinemet taking the 25/100 mg tablets 3 times daily. The patient is on Azilect taking 1 mg daily. The patient is tolerating medications well. The patient has a resting tremor on the right upper extremity, and he has a benign essential tremor that affects both upper extremities. The patient has been quite active, and he plays golf on a regular basis. The patient has not had any falls. The patient sleeps fairly well at night, taking Benadryl at night for sleep. The patient does not act out his dreams at night. The patient denies any problems with swallowing or speech. The patient returns for an evaluation.  Past Medical History  Diagnosis Date  . Pancreatitis 1976  . Lumbar disc disease   . Hyperlipidemia   . Hyperplasia of prostate   . Cardiac dysrhythmia, unspecified   . Allergic rhinitis, cause unspecified   . Colon polyps   . Hemorrhoids, external   . Hiatal hernia   . GERD (gastroesophageal reflux disease)   . Hypertension   . Essential and other specified forms of tremor   . Paralysis agitans 11/07/2012  . Prostate cancer   . Parkinson disease     dx 4-5 months ago  . Peyronie's disease     Past Surgical History  Procedure Laterality Date  . Knee surgery    . Shoulder surgery Left   . Hemorrhoid surgery    . Prostate biopsy  04/03/2013  . Prostate biopsy  11/01/2011    Family History  Problem Relation Age of Onset  . Stroke Father   . Kidney failure Father   . Alcoholism Brother   . Heart attack Brother   . Cancer  Neg Hx     Social history:  reports that he has never smoked. He has never used smokeless tobacco. He reports that he does not drink alcohol or use illicit drugs.    Allergies  Allergen Reactions  . Aspirin     Upsets stomach  . Aspirin Free Childs [Acetaminophen]   . Calicum Glycerophosphate   . Niaspan [Niacin Er]   . Prednisone   . Zocor [Simvastatin]     Medications:  Current Outpatient Prescriptions on File Prior to Visit  Medication Sig Dispense Refill  . amLODipine (NORVASC) 10 MG tablet 10 mg. One half tab po daily      . atorvastatin (LIPITOR) 40 MG tablet TAKE ONE TABLET AT BEDTIME  90 tablet  1  . Cholecalciferol (VITAMIN D3) 5000 UNITS CAPS Take 1 capsule by mouth 4 (four) times a week.       . diphenhydrAMINE (BENADRYL) 25 MG tablet Take 25 mg by mouth at bedtime as needed for itching (1/2 tablet).      Marland Kitchen docusate sodium (COLACE) 100 MG capsule Take 100 mg by mouth 2 (two) times daily. daily      . fluticasone (FLONASE) 50 MCG/ACT nasal spray INHALE 1 SPRAYS INTO EACH NOSTRIL ONCE DAILY  16 g  1  . Folic Acid-Vit W0-[id number] X91 (FOLGARD RX 2.2 PO) Take 1 tablet by mouth daily.        Marland Kitchen  Probiotic Product (PHILLIPS COLON HEALTH PO) Take by mouth 1 dose over 24 hours.        Marland Kitchen terazosin (HYTRIN) 2 MG capsule TAKE (1) CAPSULE DAILY AT BEDTIME.  90 capsule  1  . valsartan-hydrochlorothiazide (DIOVAN-HCT) 320-12.5 MG per tablet Take 1/4 tablet daily       No current facility-administered medications on file prior to visit.    ROS:  Out of a complete 14 system review of symptoms, the patient complains only of the following symptoms, and all other reviewed systems are negative.  Constipation, diarrhea Muscle cramps Tremors  Blood pressure 115/65, pulse 74, height 5' 7.5" (1.715 m), weight 165 lb (74.844 kg).  Physical Exam  General: The patient is alert and cooperative at the time of the examination. Masking of the face is seen.  Skin: No significant peripheral edema  is noted.   Neurologic Exam  Mental status: The patient is oriented x 3.  Cranial nerves: Facial symmetry is present. Speech is normal, no aphasia or dysarthria is noted. Extraocular movements are full, with exception of some restriction of superior gaze. Visual fields are full.  Motor: The patient has good strength in all 4 extremities.  Sensory examination: Soft touch sensation on the face, arms, and legs is symmetric.  Coordination: The patient has good finger-nose-finger and heel-to-shin bilaterally. The patient has bilateral intention tremors with finger-nose-finger.  Gait and station: The patient is able to arise from a seated position with arms crossed. Once up, the patient has slight decreased arm swing on the right, with tremor seen. Tandem gait is normal. Romberg is negative. No drift is seen.  Reflexes: Deep tendon reflexes are symmetric.   Assessment/Plan:  One. Parkinson's disease  2. Benign essential tremor  The patient is doing relatively well at this point with his Parkinson's symptoms. The patient is remaining active, playing golf and going for exercise classes on a regular basis. I have encouraged him to keep this activity level up. The patient will be maintained on his current medications, prescriptions were called in. The patient will followup in 5 months.  Jill Alexanders MD 10/17/2013 10:31 AM  [facility] [address] [place], [place] [address]  Phone [phone] Fax [phone]

## 2013-10-28 ENCOUNTER — Other Ambulatory Visit: Payer: Self-pay | Admitting: Family Medicine

## 2013-11-18 ENCOUNTER — Ambulatory Visit (INDEPENDENT_AMBULATORY_CARE_PROVIDER_SITE_OTHER): Payer: Medicare Other | Admitting: Family Medicine

## 2013-11-18 ENCOUNTER — Encounter: Payer: Self-pay | Admitting: Family Medicine

## 2013-11-18 ENCOUNTER — Ambulatory Visit (INDEPENDENT_AMBULATORY_CARE_PROVIDER_SITE_OTHER): Payer: Medicare Other

## 2013-11-18 VITALS — BP 110/63 | HR 75 | Temp 97.8°F | Ht 67.5 in | Wt 167.0 lb

## 2013-11-18 DIAGNOSIS — I2510 Atherosclerotic heart disease of native coronary artery without angina pectoris: Secondary | ICD-10-CM

## 2013-11-18 DIAGNOSIS — M549 Dorsalgia, unspecified: Secondary | ICD-10-CM | POA: Diagnosis not present

## 2013-11-18 LAB — POCT URINALYSIS DIPSTICK
Bilirubin, UA: NEGATIVE
Glucose, UA: NEGATIVE
Ketones, UA: NEGATIVE
Leukocytes, UA: NEGATIVE
Nitrite, UA: NEGATIVE
Spec Grav, UA: >=1.03
Urobilinogen, UA: NEGATIVE
pH, UA: 5

## 2013-11-18 LAB — POCT UA - MICROSCOPIC ONLY
Bacteria, U Microscopic: NEGATIVE
Casts, Ur, LPF, POC: NEGATIVE
Crystals, Ur, HPF, POC: NEGATIVE
Mucus, UA: NEGATIVE
RBC, urine, microscopic: NEGATIVE
Yeast, UA: NEGATIVE

## 2013-11-18 NOTE — Progress Notes (Signed)
Patient ID: Keith Hill, male   DOB: 03/05/1932, 80 y.o.   MRN: [id number] SUBJECTIVE: CC: Chief Complaint  Patient presents with  . Back Pain    right sided back pain x 4 days. Area is not tender to the touch. Has not taken anything OTC.     HPI: Right CVA pain for 4 days. It is an ache deep inside. Sitting still there is no pain but when he turns he can feel it inside. No change in urination. He has a non aggressive prostate cancer.no hematuria. No h/o kidney stones. No changes in bowel movement. No significant changes in weight and  Apetite. He does play golf.   Past Medical History  Diagnosis Date  . Pancreatitis 1976  . Lumbar disc disease   . Hyperlipidemia   . Hyperplasia of prostate   . Cardiac dysrhythmia, unspecified   . Allergic rhinitis, cause unspecified   . Colon polyps   . Hemorrhoids, external   . Hiatal hernia   . GERD (gastroesophageal reflux disease)   . Hypertension   . Essential and other specified forms of tremor   . Paralysis agitans 11/07/2012  . Prostate cancer   . Parkinson disease     dx 4-5 months ago  . Peyronie's disease    Past Surgical History  Procedure Laterality Date  . Knee surgery    . Shoulder surgery Left   . Hemorrhoid surgery    . Prostate biopsy  04/03/2013  . Prostate biopsy  11/01/2011   History   Social History  . Marital Status: Married    Spouse Name: [name]    Number of Children: 1  . Years of Education: College   Occupational History  . retired   .     Social History Main Topics  . Smoking status: Never Smoker   . Smokeless tobacco: Never Used  . Alcohol Use: No  . Drug Use: No  . Sexual Activity: Not on file   Other Topics Concern  . Not on file   Social History Narrative   Patient lives at home with spouse.   Caffeine Use: 1 cup daily   Family History  Problem Relation Age of Onset  . Stroke Father   . Kidney failure Father   . Alcoholism Brother   . Heart attack Brother   . Cancer Neg Hx     Current Outpatient Prescriptions on File Prior to Visit  Medication Sig Dispense Refill  . amLODipine (NORVASC) 10 MG tablet 10 mg. One half tab po daily      . atorvastatin (LIPITOR) 40 MG tablet TAKE ONE TABLET AT BEDTIME  90 tablet  1  . carbidopa-levodopa (SINEMET IR) 25-100 MG per tablet Take 1 tablet by mouth 3 (three) times daily.  270 tablet  3  . Cholecalciferol (VITAMIN D3) 5000 UNITS CAPS Take 1 capsule by mouth 4 (four) times a week.       . diphenhydrAMINE (BENADRYL) 25 MG tablet Take 25 mg by mouth at bedtime as needed for itching (1/2 tablet).      Marland Kitchen docusate sodium (COLACE) 100 MG capsule Take 100 mg by mouth 2 (two) times daily. daily      . fluticasone (FLONASE) 50 MCG/ACT nasal spray INHALE 1 SPRAYS INTO EACH NOSTRIL ONCE DAILY  16 g  1  . Folic Acid-Vit P9-YTW K46 (FOLGARD RX 2.2 PO) Take 1 tablet by mouth daily.        [name] Oil 300 MG CAPS Take  by mouth daily.      . Probiotic Product ([place]) Take by mouth 1 dose over 24 hours.        . rasagiline (AZILECT) 1 MG TABS tablet Take 1 tablet (1 mg total) by mouth daily.  90 tablet  3  . terazosin (HYTRIN) 2 MG capsule TAKE (1) CAPSULE DAILY AT BEDTIME.  90 capsule  1  . valsartan-hydrochlorothiazide (DIOVAN-HCT) 320-12.5 MG per tablet Take 1/4 tablet daily       No current facility-administered medications on file prior to visit.   Allergies  Allergen Reactions  . Aspirin     Upsets stomach  . Aspirin Free Childs [Acetaminophen]   . Calicum Glycerophosphate   . Niaspan [Niacin Er]   . Prednisone   . Zocor [Simvastatin]    Immunization History  Administered Date(s) Administered  . Influenza Whole 04/21/2010  . Influenza,inj,Quad PF,36+ Mos 05/29/2013  . Pneumococcal Conjugate-13 08/28/2013  . Pneumococcal Polysaccharide-23 07/21/2006  . Td 10/26/2008  . Zoster 07/25/2006   Prior to Admission medications   Medication Sig Start Date End Date Taking? Authorizing Provider  amLODipine  (NORVASC) 10 MG tablet 10 mg. One half tab po daily   Yes Historical Provider, MD  atorvastatin (LIPITOR) 40 MG tablet TAKE ONE TABLET AT BEDTIME 09/03/13  Yes Chipper Herb, MD  carbidopa-levodopa (SINEMET IR) 25-100 MG per tablet Take 1 tablet by mouth 3 (three) times daily. 10/17/13  Yes Kathrynn Ducking, MD  Cholecalciferol (VITAMIN D3) 5000 UNITS CAPS Take 1 capsule by mouth 4 (four) times a week.    Yes Historical Provider, MD  diphenhydrAMINE (BENADRYL) 25 MG tablet Take 25 mg by mouth at bedtime as needed for itching (1/2 tablet).   Yes Historical Provider, MD  docusate sodium (COLACE) 100 MG capsule Take 100 mg by mouth 2 (two) times daily. daily   Yes Historical Provider, MD  fluticasone (FLONASE) 50 MCG/ACT nasal spray INHALE 1 SPRAYS INTO EACH NOSTRIL ONCE DAILY   Yes Chipper Herb, MD  Folic Acid-Vit Z6-XWR U04 (FOLGARD RX 2.2 PO) Take 1 tablet by mouth daily.     Yes Historical Provider, MD  [name] Oil 300 MG CAPS Take by mouth daily.   Yes Historical Provider, MD  Probiotic Product ([place]) Take by mouth 1 dose over 24 hours.     Yes Historical Provider, MD  rasagiline (AZILECT) 1 MG TABS tablet Take 1 tablet (1 mg total) by mouth daily. 10/17/13  Yes Kathrynn Ducking, MD  terazosin (HYTRIN) 2 MG capsule TAKE (1) CAPSULE DAILY AT BEDTIME.   Yes Chipper Herb, MD  valsartan-hydrochlorothiazide (DIOVAN-HCT) 320-12.5 MG per tablet Take 1/4 tablet daily   Yes Historical Provider, MD     ROS: As above in the HPI. All other systems are stable or negative.  OBJECTIVE: APPEARANCE:  Patient in no acute distress.The patient appeared well nourished and normally developed. Acyanotic. Waist: VITAL SIGNS:BP 110/63  Pulse 75  Temp(Src) 97.8 F (36.6 C) (Oral)  Ht 5' 7.5" (1.715 m)  Wt 167 lb (75.751 kg)  BMI 25.75 kg/m2  WM  SKIN: warm and  Dry without overt rashes, tattoos and scars  HEAD and Neck: without JVD, Head and scalp: normal Eyes:No scleral icterus.  Fundi normal, eye movements normal. Ears: Auricle normal, canal normal, Tympanic membranes normal, insufflation normal. Nose: normal Throat: normal Neck & thyroid: normal  CHEST & LUNGS: Chest wall: normal Lungs: Clear  CVS: Reveals the PMI to be  normally located. Regular rhythm, First and Second Heart sounds are normal,  absence of murmurs, rubs or gallops. Peripheral vasculature: Radial pulses: normal Dorsal pedis pulses: normal Posterior pulses: normal  ABDOMEN:  Appearance: normal Benign, no organomegaly, no masses, no Abdominal Aortic enlargement. No Guarding , no rebound. No Bruits. Bowel sounds: normal CVA no tenderness.  RECTAL: N/A GU: N/A  EXTREMETIES: nonedematous.  MUSCULOSKELETAL:  Spine: normal Joints: intact Golf swing motion reproduces the pain.   NEUROLOGIC: oriented to time,place and person; nonfocal. Cranial Nerves are normal.  ASSESSMENT:  Back pain - Plan: POCT UA - Microscopic Only, POCT urinalysis dipstick, DG Abd 1 View, DG Lumbar Spine Complete Suspect that the back pain is musculoskeletal. However, will evaluate due to h/o prostate cancer.    PLAN:  Orders Placed This Encounter  Procedures  . DG Abd 1 View    Standing Status: Future     Number of Occurrences: 1     Standing Expiration Date: 01/18/2015    Order Specific Question:  Reason for Exam (SYMPTOM  OR DIAGNOSIS REQUIRED)    Answer:  right flank/back pain    Order Specific Question:  Preferred imaging location?    Answer:  Internal  . DG Lumbar Spine Complete    Standing Status: Future     Number of Occurrences:      Standing Expiration Date: 01/19/2015    Order Specific Question:  Reason for Exam (SYMPTOM  OR DIAGNOSIS REQUIRED)    Answer:  upper lumbar pain., prostate cancer    Order Specific Question:  Preferred imaging location?    Answer:  Internal  . POCT UA - Microscopic Only  . POCT urinalysis dipstick   Results for orders placed in visit on 11/18/13  POCT  UA - MICROSCOPIC ONLY      Result Value Ref Range   WBC, Ur, HPF, POC 1-5 w/ mod sperm     RBC, urine, microscopic neg     Bacteria, U Microscopic neg     Mucus, UA neg     Epithelial cells, urine per micros occ     Crystals, Ur, HPF, POC neg     Casts, Ur, LPF, POC neg     Yeast, UA neg    POCT URINALYSIS DIPSTICK      Result Value Ref Range   Color, UA gold     Clarity, UA clear     Glucose, UA neg     Bilirubin, UA neg     Ketones, UA neg     Spec Grav, UA >=1.030     Blood, UA trace     pH, UA 5.0     Protein, UA trace     Urobilinogen, UA negative     Nitrite, UA neg     Leukocytes, UA Negative     [facility] reading (PRIMARY) by  Dr. Jacelyn Grip: no acute findings. Degenerative changes of the lumbar spine. No stones seen. The aorta is calcified but not diated. Heat tylenol. Observe. Conservative measures. Back exercises discussed and in the handout.                                  Stop golf for 2 weeks. Heat to the back Use OTC tylenol for discomfort.  No orders of the defined types were placed in this encounter.   Medications Discontinued During This Encounter  Medication Reason  . valsartan-hydrochlorothiazide (DIOVAN-HCT) 320-25 MG per tablet Duplicate  Return if symptoms worsen or fail to improve.to see Dr Laurance Flatten in 2 weeks.   Evadene Wardrip P. Jacelyn Grip, M.D.

## 2013-11-18 NOTE — Patient Instructions (Signed)
Stop golf for 2 weeks. Heat to the back Use OTC tylenol for discomfort.   Back Exercises Back exercises help treat and prevent back injuries. The goal of back exercises is to increase the strength of your abdominal and back muscles and the flexibility of your back. These exercises should be started when you no longer have back pain. Back exercises include:  Pelvic Tilt. Lie on your back with your knees bent. Tilt your pelvis until the lower part of your back is against the floor. Hold this position 5 to 10 sec and repeat 5 to 10 times.  Knee to Chest. Pull first 1 knee up against your chest and hold for 20 to 30 seconds, repeat this with the other knee, and then both knees. This may be done with the other leg straight or bent, whichever feels better.  Sit-Ups or Curl-Ups. Bend your knees 90 degrees. Start with tilting your pelvis, and do a partial, slow sit-up, lifting your trunk only 30 to 45 degrees off the floor. Take at least 2 to 3 seconds for each sit-up. Do not do sit-ups with your knees out straight. If partial sit-ups are difficult, simply do the above but with only tightening your abdominal muscles and holding it as directed.  Hip-Lift. Lie on your back with your knees flexed 90 degrees. Push down with your feet and shoulders as you raise your hips a couple inches off the floor; hold for 10 seconds, repeat 5 to 10 times.  Back arches. Lie on your stomach, propping yourself up on bent elbows. Slowly press on your hands, causing an arch in your low back. Repeat 3 to 5 times. Any initial stiffness and discomfort should lessen with repetition over time.  Shoulder-Lifts. Lie face down with arms beside your body. Keep hips and torso pressed to floor as you slowly lift your head and shoulders off the floor. Do not overdo your exercises, especially in the beginning. Exercises may cause you some mild back discomfort which lasts for a few minutes; however, if the pain is more severe, or lasts for  more than 15 minutes, do not continue exercises until you see your caregiver. Improvement with exercise therapy for back problems is slow.  See your caregivers for assistance with developing a proper back exercise program. Document Released: 09/14/2004 Document Revised: 10/30/2011 Document Reviewed: 06/08/2011 [facility] Patient Information 2014 [place].

## 2013-12-08 ENCOUNTER — Other Ambulatory Visit: Payer: Self-pay | Admitting: Family Medicine

## 2013-12-11 ENCOUNTER — Other Ambulatory Visit (INDEPENDENT_AMBULATORY_CARE_PROVIDER_SITE_OTHER): Payer: Medicare Other

## 2013-12-11 DIAGNOSIS — I10 Essential (primary) hypertension: Secondary | ICD-10-CM | POA: Diagnosis not present

## 2013-12-11 DIAGNOSIS — E559 Vitamin D deficiency, unspecified: Secondary | ICD-10-CM | POA: Diagnosis not present

## 2013-12-11 DIAGNOSIS — E785 Hyperlipidemia, unspecified: Secondary | ICD-10-CM

## 2013-12-11 LAB — POCT CBC
Granulocyte percent: 73.2 %G (ref 37–80)
HEMATOCRIT: 40 % — AB (ref 43.5–53.7)
HEMOGLOBIN: 12.9 g/dL — AB (ref 14.1–18.1)
Lymph, poc: 1.6 (ref 0.6–3.4)
MCH, POC: 32.2 pg — AB (ref 27–31.2)
MCHC: 32.2 g/dL (ref 31.8–35.4)
MCV: 99.7 fL — AB (ref 80–97)
MPV: 7 fL (ref 0–99.8)
POC Granulocyte: 5.7 (ref 2–6.9)
POC LYMPH PERCENT: 20.2 %L (ref 10–50)
Platelet Count, POC: 241 10*3/uL (ref 142–424)
RBC: 4 M/uL — AB (ref 4.69–6.13)
RDW, POC: 13.2 %
WBC: 7.8 10*3/uL (ref 4.6–10.2)

## 2013-12-11 NOTE — Progress Notes (Signed)
Pt came in for labs only 

## 2013-12-13 LAB — HEPATIC FUNCTION PANEL
ALT: 28 IU/L (ref 0–44)
AST: 31 IU/L (ref 0–40)
Albumin: 4.4 g/dL (ref 3.5–4.7)
Alkaline Phosphatase: 77 IU/L (ref 39–117)
BILIRUBIN TOTAL: 0.8 mg/dL (ref 0.0–1.2)
Bilirubin, Direct: 0.2 mg/dL (ref 0.00–0.40)
TOTAL PROTEIN: 7.4 g/dL (ref 6.0–8.5)

## 2013-12-13 LAB — BMP8+EGFR
BUN/Creatinine Ratio: 18 (ref 10–22)
BUN: 21 mg/dL (ref 8–27)
CO2: 25 mmol/L (ref 18–29)
Calcium: 9.3 mg/dL (ref 8.6–10.2)
Chloride: 102 mmol/L (ref 97–108)
Creatinine, Ser: 1.2 mg/dL (ref 0.76–1.27)
GFR calc non Af Amer: 57 mL/min/{1.73_m2} — ABNORMAL LOW (ref >59)
GFR, EST AFRICAN AMERICAN: 66 mL/min/{1.73_m2} (ref >59)
Glucose: 92 mg/dL (ref 65–99)
POTASSIUM: 4.1 mmol/L (ref 3.5–5.2)
SODIUM: 142 mmol/L (ref 134–144)

## 2013-12-13 LAB — NMR, LIPOPROFILE
CHOLESTEROL: 130 mg/dL (ref <200)
HDL CHOLESTEROL BY NMR: 53 mg/dL (ref >=40)
HDL PARTICLE NUMBER: 31.8 umol/L (ref >=30.5)
LDL Particle Number: 577 nmol/L (ref <1000)
LDL Size: 21 nm (ref >20.5)
LDLC SERPL CALC-MCNC: 62 mg/dL (ref <100)
LP-IR SCORE: 32 (ref <=45)
Small LDL Particle Number: 224 nmol/L (ref <=527)
Triglycerides by NMR: 73 mg/dL (ref <150)

## 2013-12-13 LAB — VITAMIN D 25 HYDROXY (VIT D DEFICIENCY, FRACTURES): VIT D 25 HYDROXY: 46.4 ng/mL (ref 30.0–100.0)

## 2013-12-15 ENCOUNTER — Telehealth: Payer: Self-pay | Admitting: Family Medicine

## 2013-12-15 NOTE — Telephone Encounter (Signed)
Pt aware of lab results and has appt 12/18/13 with [facility].

## 2013-12-15 NOTE — Telephone Encounter (Signed)
Message copied by Waverly Ferrari on Mon Dec 15, 2013  9:57 AM ------      Message from: Chipper Herb      Created: Sun Dec 14, 2013  2:16 PM       The blood sugar and electrolytes, and the most important test, the creatinine were all within normal limits. GFR was slightly decreased----avoid NSAIDs      All cholesterol numbers by advanced lipid testing were excellent and at goal, continue current treatment and as aggressive therapeutic lifestyle changes as possible      All liver function tests are within normal      The vitamin D level was within normal limits, continue current treatment ------

## 2013-12-18 ENCOUNTER — Encounter: Payer: Self-pay | Admitting: Family Medicine

## 2013-12-18 ENCOUNTER — Telehealth: Payer: Self-pay | Admitting: *Deleted

## 2013-12-18 ENCOUNTER — Ambulatory Visit (INDEPENDENT_AMBULATORY_CARE_PROVIDER_SITE_OTHER): Payer: Medicare Other | Admitting: Family Medicine

## 2013-12-18 VITALS — BP 134/61 | HR 69 | Temp 98.3°F | Ht 67.5 in | Wt 164.8 lb

## 2013-12-18 DIAGNOSIS — I10 Essential (primary) hypertension: Secondary | ICD-10-CM | POA: Diagnosis not present

## 2013-12-18 DIAGNOSIS — I2510 Atherosclerotic heart disease of native coronary artery without angina pectoris: Secondary | ICD-10-CM

## 2013-12-18 DIAGNOSIS — E785 Hyperlipidemia, unspecified: Secondary | ICD-10-CM

## 2013-12-18 DIAGNOSIS — G20 Parkinson's disease: Secondary | ICD-10-CM | POA: Diagnosis not present

## 2013-12-18 DIAGNOSIS — E559 Vitamin D deficiency, unspecified: Secondary | ICD-10-CM

## 2013-12-18 DIAGNOSIS — M549 Dorsalgia, unspecified: Secondary | ICD-10-CM | POA: Diagnosis not present

## 2013-12-18 DIAGNOSIS — M47815 Spondylosis without myelopathy or radiculopathy, thoracolumbar region: Secondary | ICD-10-CM

## 2013-12-18 DIAGNOSIS — M479 Spondylosis, unspecified: Secondary | ICD-10-CM

## 2013-12-18 NOTE — Progress Notes (Signed)
Subjective:    Patient ID: Keith Hill, male    DOB: 27-Sep-1931, 80 y.o.   MRN: [id number]  HPI Pt here for follow up and management of chronic medical problems. The patient's although the other providers recently for back pain. He is still having some back pain but indicates that the past 2-3 days it is better. Looking back at a previous MRI in 2013 he did have spondylosis and some bulging disc. We will review his recent lab work with him today. It is of note that his creatinine was good at the GFR was slightly decreased. The patient knows that he should be avoiding NSAIDs. The patient is seeing the neurologist regularly for his Parkinson's. He has had some change in his bowel movements since starting some new medication.         Patient Active Problem List   Diagnosis Date Noted  . Back pain 11/18/2013  . Vitamin D deficiency 08/28/2013  . Hiatal hernia with gastroesophageal reflux 08/28/2013  . CAD (coronary artery disease) 04/08/2013  . Essential and other specified forms of tremor 03/31/2013  . Parkinson's 11/07/2012  . Hemorrhoids 02/17/2011  . Hyperlipidemia   . Prostate cancer   . Hypertension   . Allergic rhinitis   . Colon polyps, history of    Outpatient Encounter Prescriptions as of 12/18/2013  Medication Sig  . amLODipine (NORVASC) 10 MG tablet TAKE (1/2) TABLET DAILY.  Marland Kitchen atorvastatin (LIPITOR) 40 MG tablet TAKE ONE TABLET AT BEDTIME  . carbidopa-levodopa (SINEMET IR) 25-100 MG per tablet Take 1 tablet by mouth 3 (three) times daily.  . Cholecalciferol (VITAMIN D3) 5000 UNITS CAPS Take 1 capsule by mouth 4 (four) times a week.   . diphenhydrAMINE (BENADRYL) 25 MG tablet Take 25 mg by mouth at bedtime as needed for itching (1/2 tablet).  Marland Kitchen docusate sodium (COLACE) 100 MG capsule Take 100 mg by mouth 2 (two) times daily. daily  . fluticasone (FLONASE) 50 MCG/ACT nasal spray INHALE 1 SPRAYS INTO EACH NOSTRIL ONCE DAILY  . Folic Acid-Vit Z7-QBH A19 (FOLGARD RX 2.2 PO)  Take 1 tablet by mouth daily.    [name] Oil 300 MG CAPS Take by mouth daily.  . Probiotic Product ([place]) Take by mouth 1 dose over 24 hours.    . rasagiline (AZILECT) 1 MG TABS tablet Take 1 tablet (1 mg total) by mouth daily.  Marland Kitchen terazosin (HYTRIN) 2 MG capsule TAKE (1) CAPSULE DAILY AT BEDTIME.  . valsartan-hydrochlorothiazide (DIOVAN-HCT) 320-12.5 MG per tablet Take 1/4 tablet daily    Review of Systems  Constitutional: Negative.   HENT: Negative.   Eyes: Negative.   Respiratory: Negative.   Cardiovascular: Negative.   Gastrointestinal: Negative.   Endocrine: Negative.   Genitourinary: Negative.   Musculoskeletal: Positive for back pain (saw Dr Jacelyn Grip for this).  Skin: Negative.   Allergic/Immunologic: Negative.   Neurological: Negative.   Hematological: Negative.   Psychiatric/Behavioral: Negative.        Objective:   Physical Exam  Nursing note and vitals reviewed. Constitutional: He is oriented to person, place, and time. He appears well-developed and well-nourished. No distress.  HENT:  Head: Normocephalic and atraumatic.  Right Ear: External ear normal.  Left Ear: External ear normal.  Nose: Nose normal.  Mouth/Throat: Oropharynx is clear and moist. No oropharyngeal exudate.  Eyes: Conjunctivae and EOM are normal. Pupils are equal, round, and reactive to light. Right eye exhibits no discharge. Left eye exhibits no discharge. No scleral icterus.  Neck: Normal range of motion. Neck supple. No thyromegaly present.  No carotid bruits  Cardiovascular: Normal rate, regular rhythm, normal heart sounds and intact distal pulses.  Exam reveals no gallop and no friction rub.   No murmur heard. At 72 per minute  Pulmonary/Chest: Effort normal and breath sounds normal. No respiratory distress. He has no wheezes. He has no rales. He exhibits no tenderness.  Abdominal: Soft. Bowel sounds are normal. He exhibits no mass. There is no tenderness. There is no rebound  and no guarding.  Genitourinary:  The patient sees his urologist yearly and a small, approximately August for his prostate cancer history  Musculoskeletal: Normal range of motion. He exhibits no edema and no tenderness.  Lymphadenopathy:    He has no cervical adenopathy.  Neurological: He is alert and oriented to person, place, and time.  The patient has a right upper extremity tremor, this appears to be stable. Reflexes in the lower extremity appeared to be slightly asymmetric with left being greater than the right  Skin: Skin is warm and dry. No rash noted. No erythema. No pallor.  Psychiatric: He has a normal mood and affect. His behavior is normal. Judgment and thought content normal.   BP 134/61  Pulse 69  Temp(Src) 98.3 F (36.8 C) (Oral)  Ht 5' 7.5" (1.715 m)  Wt 164 lb 12.8 oz (74.753 kg)  BMI 25.42 kg/m2        Assessment & Plan:  1. Hyperlipidemia  2. Hypertension  3. Parkinson's  4. CAD (coronary artery disease)  5. Vitamin D deficiency  6. Back pain -Urinalysis today  7. Osteoarthritis of back -Continue with Tylenol and regular exercise -If problems continue referral to neurosurgeon and possibly repeat MRI  Patient Instructions                       Medicare Annual Wellness Visit  [facility] and the medical providers at [facility] strive to bring you the best medical care.  In doing so we not only want to address your current medical conditions and concerns but also to detect new conditions early and prevent illness, disease and health-related problems.    Medicare offers a yearly Wellness Visit which allows our clinical staff to assess your need for preventative services including immunizations, lifestyle education, counseling to decrease risk of preventable diseases and screening for fall risk and other medical concerns.    This visit is provided free of charge (no copay) for all Medicare recipients. The clinical pharmacists at  [facility] have begun to conduct these Wellness Visits which will also include a thorough review of all your medications.    As you primary medical provider recommend that you make an appointment for your Annual Wellness Visit if you have not done so already this year.  You may set up this appointment before you leave today or you may call back ([phone]) and schedule an appointment.  Please make sure when you call that you mention that you are scheduling your Annual Wellness Visit with the clinical pharmacist so that the appointment may be made for the proper length of time.      Continue current medications. Continue good therapeutic lifestyle changes which include good diet and exercise. Fall precautions discussed with patient. If an FOBT was given today- please return it to our front desk. If you are over 16 years old - you may need Prevnar 24 or the adult Pneumonia vaccine.  It is important to keep exercising regularly especially walking Take Tylenol as needed for pain If the pain symptoms continue, call us, and we will refer you to a neurosurgeon for further evaluation and possibly a repeat MRI Leave the caffeine and of your diet as much as possible and see if this will help control her be loose bowel movements   Arrie Senate MD

## 2013-12-18 NOTE — Patient Instructions (Addendum)
Medicare Annual Wellness Visit  [facility] and the medical providers at [facility] strive to bring you the best medical care.  In doing so we not only want to address your current medical conditions and concerns but also to detect new conditions early and prevent illness, disease and health-related problems.    Medicare offers a yearly Wellness Visit which allows our clinical staff to assess your need for preventative services including immunizations, lifestyle education, counseling to decrease risk of preventable diseases and screening for fall risk and other medical concerns.    This visit is provided free of charge (no copay) for all Medicare recipients. The clinical pharmacists at [facility] have begun to conduct these Wellness Visits which will also include a thorough review of all your medications.    As you primary medical provider recommend that you make an appointment for your Annual Wellness Visit if you have not done so already this year.  You may set up this appointment before you leave today or you may call back ([phone]) and schedule an appointment.  Please make sure when you call that you mention that you are scheduling your Annual Wellness Visit with the clinical pharmacist so that the appointment may be made for the proper length of time.      Continue current medications. Continue good therapeutic lifestyle changes which include good diet and exercise. Fall precautions discussed with patient. If an FOBT was given today- please return it to our front desk. If you are over 52 years old - you may need Prevnar 107 or the adult Pneumonia vaccine.  It is important to keep exercising regularly especially walking Take Tylenol as needed for pain If the pain symptoms continue, call us, and we will refer you to a neurosurgeon for further evaluation and possibly a repeat MRI Leave the caffeine and of your diet as much as  possible and see if this will help control her be loose bowel movements

## 2013-12-18 NOTE — Telephone Encounter (Signed)
The patient was called and told that the new medication that he was taken for his Parkinson's disease with Asacol for constipation and not loose bowel movements. He was instructed to continue to take the probiotic and to try to add more fiber to his diet like citrucil. He said he understood what I was telling him to do. You do not have to call him

## 2013-12-18 NOTE — Telephone Encounter (Signed)
Constipation is commonly seen with parkinson's disease and is usually treated with increased fiber in diet or dietary supplementation.  There have also been a few positive studies looking at the use of daily probiotics for the treatment of constipation associated with parkinson's disease.   Medications that might cause constipation that Mr. Schnorr is on include:  rasagilin - about 4 to 9%  Terazosin - about 1%  Sinemet - about 1 %  Amlodipine - less than 1%

## 2013-12-18 NOTE — Telephone Encounter (Signed)
Patient was here today for appt with dr Laurance Flatten and he want you to look at her meds and see what may cause constipation- he was specifically interested in the med for parkinson's. Please let me know

## 2014-01-20 DIAGNOSIS — H3530 Unspecified macular degeneration: Secondary | ICD-10-CM | POA: Diagnosis not present

## 2014-01-20 DIAGNOSIS — H2510 Age-related nuclear cataract, unspecified eye: Secondary | ICD-10-CM | POA: Diagnosis not present

## 2014-01-22 DIAGNOSIS — M19049 Primary osteoarthritis, unspecified hand: Secondary | ICD-10-CM | POA: Diagnosis not present

## 2014-03-02 ENCOUNTER — Encounter: Payer: Self-pay | Admitting: Family Medicine

## 2014-03-02 ENCOUNTER — Ambulatory Visit (INDEPENDENT_AMBULATORY_CARE_PROVIDER_SITE_OTHER): Payer: Medicare Other | Admitting: Family Medicine

## 2014-03-02 VITALS — BP 126/67 | HR 66 | Temp 97.7°F | Ht 67.5 in | Wt 168.0 lb

## 2014-03-02 DIAGNOSIS — R1013 Epigastric pain: Secondary | ICD-10-CM

## 2014-03-02 DIAGNOSIS — R0789 Other chest pain: Secondary | ICD-10-CM | POA: Diagnosis not present

## 2014-03-02 DIAGNOSIS — R143 Flatulence: Secondary | ICD-10-CM

## 2014-03-02 DIAGNOSIS — R141 Gas pain: Secondary | ICD-10-CM | POA: Diagnosis not present

## 2014-03-02 DIAGNOSIS — R0781 Pleurodynia: Secondary | ICD-10-CM

## 2014-03-02 DIAGNOSIS — R140 Abdominal distension (gaseous): Secondary | ICD-10-CM

## 2014-03-02 DIAGNOSIS — R079 Chest pain, unspecified: Secondary | ICD-10-CM | POA: Diagnosis not present

## 2014-03-02 DIAGNOSIS — IMO0001 Reserved for inherently not codable concepts without codable children: Secondary | ICD-10-CM

## 2014-03-02 DIAGNOSIS — R142 Eructation: Secondary | ICD-10-CM

## 2014-03-02 LAB — POCT CBC
GRANULOCYTE PERCENT: 72.6 % (ref 37–80)
HCT, POC: 39.8 % — AB (ref 43.5–53.7)
HEMOGLOBIN: 13.1 g/dL — AB (ref 14.1–18.1)
LYMPH, POC: 1.9 (ref 0.6–3.4)
MCH, POC: 32.8 pg — AB (ref 27–31.2)
MCHC: 32.8 g/dL (ref 31.8–35.4)
MCV: 99.9 fL — AB (ref 80–97)
MPV: 6.6 fL (ref 0–99.8)
PLATELET COUNT, POC: 243 10*3/uL (ref 142–424)
POC Granulocyte: 7 — AB (ref 2–6.9)
POC LYMPH %: 19.4 % (ref 10–50)
RBC: 4 M/uL — AB (ref 4.69–6.13)
RDW, POC: 12.9 %
WBC: 9.7 10*3/uL (ref 4.6–10.2)

## 2014-03-02 NOTE — Patient Instructions (Signed)
Continue Zantac 150 twice daily before breakfast and supper for 30 day Return an FOBT We will call you with the results of the lab work was as results are available If you have any worsening symptoms like chest pressure shortness of breath or increasing frequency of abdominal discomfort please call us back immediately Avoid caffeine milk cheese ice cream and dairy products

## 2014-03-02 NOTE — Progress Notes (Signed)
Subjective:    Patient ID: Keith Hill, male    DOB: 30-Jul-1932, 80 y.o.   MRN: [id number]  HPI Patient here today for indigestion and rib/chest pain. This started Saturday morning and patient does feel relief with belching. He has also tried OTC Zantac and it seems to help as well. The patient has been exercising regularly and has had no problems with exercise. This discomfort is epigastric in nature with fullness and bloating and belching relieves the discomfort. He also thought the Zantac relieved the discomfort. He indicates that his medication for his Parkinson's is caused a lot of problems with his stomach.        Patient Active Problem List   Diagnosis Date Noted  . Back pain 11/18/2013  . Vitamin D deficiency 08/28/2013  . Hiatal hernia with gastroesophageal reflux 08/28/2013  . CAD (coronary artery disease) 04/08/2013  . Essential and other specified forms of tremor 03/31/2013  . Parkinson's 11/07/2012  . Hemorrhoids 02/17/2011  . Hyperlipidemia   . Prostate cancer   . Hypertension   . Allergic rhinitis   . Colon polyps, history of    Outpatient Encounter Prescriptions as of 03/02/2014  Medication Sig  . amLODipine (NORVASC) 10 MG tablet TAKE (1/2) TABLET DAILY.  Marland Kitchen atorvastatin (LIPITOR) 40 MG tablet TAKE ONE TABLET AT BEDTIME  . carbidopa-levodopa (SINEMET IR) 25-100 MG per tablet Take 1 tablet by mouth 3 (three) times daily.  . Cholecalciferol (VITAMIN D3) 5000 UNITS CAPS Take 1 capsule by mouth 4 (four) times a week.   . diphenhydrAMINE (BENADRYL) 25 MG tablet Take 25 mg by mouth at bedtime as needed for itching (1/2 tablet).  Marland Kitchen docusate sodium (COLACE) 100 MG capsule Take 100 mg by mouth 2 (two) times daily. daily  . fluticasone (FLONASE) 50 MCG/ACT nasal spray INHALE 1 SPRAYS INTO EACH NOSTRIL ONCE DAILY  . Folic Acid-Vit W5-YKD X83 (FOLGARD RX 2.2 PO) Take 1 tablet by mouth daily.    [name] Oil 300 MG CAPS Take by mouth daily.  . Probiotic Product ([place]) Take by mouth 1 dose over 24 hours.    . rasagiline (AZILECT) 1 MG TABS tablet Take 1 tablet (1 mg total) by mouth daily.  Marland Kitchen terazosin (HYTRIN) 2 MG capsule TAKE (1) CAPSULE DAILY AT BEDTIME.  . valsartan-hydrochlorothiazide (DIOVAN-HCT) 320-12.5 MG per tablet Take 1/4 tablet daily    Review of Systems  Constitutional: Negative.   HENT: Negative.   Eyes: Negative.   Respiratory: Negative.  Negative for chest tightness and shortness of breath.   Cardiovascular: Negative.  Negative for chest pain.  Gastrointestinal: Positive for nausea (at times). Abdominal pain: upper abdominal/rib pain.       Indigestion/belching  Endocrine: Negative.   Genitourinary: Negative.   Musculoskeletal: Negative.   Skin: Negative.   Allergic/Immunologic: Negative.   Neurological: Negative.  Negative for headaches.  Hematological: Negative.   Psychiatric/Behavioral: Negative.        Objective:   Physical Exam  Nursing note and vitals reviewed. Constitutional: He is oriented to person, place, and time. He appears well-developed and well-nourished. No distress.  HENT:  Head: Normocephalic and atraumatic.  Eyes: Conjunctivae and EOM are normal. Pupils are equal, round, and reactive to light. Right eye exhibits no discharge. Left eye exhibits no discharge. No scleral icterus.  Neck: Normal range of motion. Neck supple. No thyromegaly present.  Cardiovascular: Normal rate, regular rhythm, normal heart sounds and intact distal pulses.   No murmur heard. Pulmonary/Chest:  Effort normal and breath sounds normal. No respiratory distress. He has no wheezes. He has no rales. He exhibits no tenderness.  Abdominal: Soft. Bowel sounds are normal. He exhibits no mass. There is tenderness. There is no rebound and no guarding.  Slight left upper quadrant tenderness  Musculoskeletal: Normal range of motion. He exhibits no edema.  Lymphadenopathy:    He has no cervical adenopathy.  Neurological: He is  alert and oriented to person, place, and time. He has normal reflexes.  Skin: Skin is warm and dry. No rash noted.  Psychiatric: He has a normal mood and affect. His behavior is normal. Judgment and thought content normal.   BP 126/67  Pulse 66  Temp(Src) 97.7 F (36.5 C) (Oral)  Ht 5' 7.5" (1.715 m)  Wt 168 lb (76.204 kg)  BMI 25.91 kg/m2        Assessment & Plan:  1. Rib pain - EKG 12-Lead - Amylase - POCT CBC  2. Chest tightness - EKG 12-Lead - Amylase - POCT CBC  3. Epigastric discomfort  4. Gas  5. Bloating   Patient Instructions  Continue Zantac 150 twice daily before breakfast and supper for 30 day Return an FOBT We will call you with the results of the lab work was as results are available If you have any worsening symptoms like chest pressure shortness of breath or increasing frequency of abdominal discomfort please call us back immediately Avoid caffeine milk cheese ice cream and dairy products    Arrie Senate MD

## 2014-03-03 ENCOUNTER — Other Ambulatory Visit: Payer: Self-pay | Admitting: Family Medicine

## 2014-03-03 LAB — AMYLASE: Amylase: 55 U/L (ref 31–124)

## 2014-03-06 ENCOUNTER — Other Ambulatory Visit: Payer: Medicare Other

## 2014-03-06 DIAGNOSIS — Z1212 Encounter for screening for malignant neoplasm of rectum: Secondary | ICD-10-CM

## 2014-03-06 NOTE — Progress Notes (Signed)
Pt came in for lab  only 

## 2014-03-08 LAB — FECAL OCCULT BLOOD, IMMUNOCHEMICAL: FECAL OCCULT BLD: NEGATIVE

## 2014-03-16 ENCOUNTER — Encounter: Payer: Self-pay | Admitting: *Deleted

## 2014-03-16 ENCOUNTER — Other Ambulatory Visit: Payer: Self-pay | Admitting: Family Medicine

## 2014-03-19 ENCOUNTER — Other Ambulatory Visit (INDEPENDENT_AMBULATORY_CARE_PROVIDER_SITE_OTHER): Payer: Medicare Other

## 2014-03-19 DIAGNOSIS — I10 Essential (primary) hypertension: Secondary | ICD-10-CM | POA: Diagnosis not present

## 2014-03-19 DIAGNOSIS — E559 Vitamin D deficiency, unspecified: Secondary | ICD-10-CM

## 2014-03-19 DIAGNOSIS — E785 Hyperlipidemia, unspecified: Secondary | ICD-10-CM | POA: Diagnosis not present

## 2014-03-19 DIAGNOSIS — Z125 Encounter for screening for malignant neoplasm of prostate: Secondary | ICD-10-CM | POA: Diagnosis not present

## 2014-03-19 LAB — POCT CBC
GRANULOCYTE PERCENT: 68.6 % (ref 37–80)
HEMATOCRIT: 43.8 % (ref 43.5–53.7)
Hemoglobin: 14.4 g/dL (ref 14.1–18.1)
Lymph, poc: 2 (ref 0.6–3.4)
MCH, POC: 30.6 pg (ref 27–31.2)
MCHC: 32.8 g/dL (ref 31.8–35.4)
MCV: 93.3 fL (ref 80–97)
MPV: 8.6 fL (ref 0–99.8)
PLATELET COUNT, POC: 135 10*3/uL — AB (ref 142–424)
POC Granulocyte: 4.9 (ref 2–6.9)
POC LYMPH PERCENT: 28.1 %L (ref 10–50)
RBC: 4.7 M/uL (ref 4.69–6.13)
RDW, POC: 14.8 %
WBC: 7.2 10*3/uL (ref 4.6–10.2)

## 2014-03-20 LAB — NMR, LIPOPROFILE
Cholesterol: 148 mg/dL (ref 100–199)
HDL CHOLESTEROL BY NMR: 56 mg/dL (ref >39)
HDL Particle Number: 34.2 umol/L (ref >=30.5)
LDL PARTICLE NUMBER: 794 nmol/L (ref <1000)
LDL Size: 20.7 nm (ref >20.5)
LDLC SERPL CALC-MCNC: 76 mg/dL (ref 0–99)
LP-IR Score: 33 (ref <=45)
Small LDL Particle Number: 314 nmol/L (ref <=527)
TRIGLYCERIDES BY NMR: 81 mg/dL (ref 0–149)

## 2014-03-20 LAB — HEPATIC FUNCTION PANEL
ALBUMIN: 4.6 g/dL (ref 3.5–4.7)
ALK PHOS: 81 IU/L (ref 39–117)
ALT: 31 IU/L (ref 0–44)
AST: 27 IU/L (ref 0–40)
BILIRUBIN TOTAL: 0.8 mg/dL (ref 0.0–1.2)
Bilirubin, Direct: 0.19 mg/dL (ref 0.00–0.40)
Total Protein: 7.5 g/dL (ref 6.0–8.5)

## 2014-03-20 LAB — BMP8+EGFR
BUN/Creatinine Ratio: 16 (ref 10–22)
BUN: 20 mg/dL (ref 8–27)
CALCIUM: 9.6 mg/dL (ref 8.6–10.2)
CHLORIDE: 99 mmol/L (ref 97–108)
CO2: 26 mmol/L (ref 18–29)
CREATININE: 1.26 mg/dL (ref 0.76–1.27)
GFR calc Af Amer: 62 mL/min/{1.73_m2} (ref >59)
GFR calc non Af Amer: 54 mL/min/{1.73_m2} — ABNORMAL LOW (ref >59)
GLUCOSE: 99 mg/dL (ref 65–99)
Potassium: 4.3 mmol/L (ref 3.5–5.2)
Sodium: 141 mmol/L (ref 134–144)

## 2014-03-20 LAB — PSA, TOTAL AND FREE
PSA FREE PCT: 22.3 %
PSA, Free: 0.69 ng/mL
PSA: 3.1 ng/mL (ref 0.0–4.0)

## 2014-03-20 LAB — VITAMIN D 25 HYDROXY (VIT D DEFICIENCY, FRACTURES): Vit D, 25-Hydroxy: 49.7 ng/mL (ref 30.0–100.0)

## 2014-03-24 ENCOUNTER — Telehealth: Payer: Self-pay | Admitting: Family Medicine

## 2014-03-24 NOTE — Telephone Encounter (Signed)
Message copied by Waverly Ferrari on Tue Mar 24, 2014 11:33 AM ------      Message from: Chipper Herb      Created: Fri Mar 20, 2014 10:36 PM       The blood sugar was good at 99. The creatinine, the most important kidney function test remains within normal limits. The electrolytes including potassium are within normal limits. The GFR is slightly decreased. This value is 54.      All cholesterol numbers with advanced lipid testing are excellent and at goal, continue current treatment      All liver function tests are within normal limit      The vitamin D level is excellent, continue current rate      The PSA is 3.1 and this is within normal limits and the rate of rise is not greater than 0.75. ------

## 2014-03-25 ENCOUNTER — Encounter: Payer: Self-pay | Admitting: Family Medicine

## 2014-03-26 ENCOUNTER — Ambulatory Visit (INDEPENDENT_AMBULATORY_CARE_PROVIDER_SITE_OTHER): Payer: Medicare Other | Admitting: Family Medicine

## 2014-03-26 ENCOUNTER — Encounter: Payer: Self-pay | Admitting: Family Medicine

## 2014-03-26 VITALS — BP 130/61 | HR 75 | Temp 97.2°F | Ht 67.5 in | Wt 162.0 lb

## 2014-03-26 DIAGNOSIS — K449 Diaphragmatic hernia without obstruction or gangrene: Secondary | ICD-10-CM

## 2014-03-26 DIAGNOSIS — E559 Vitamin D deficiency, unspecified: Secondary | ICD-10-CM

## 2014-03-26 DIAGNOSIS — K219 Gastro-esophageal reflux disease without esophagitis: Secondary | ICD-10-CM

## 2014-03-26 DIAGNOSIS — G20 Parkinson's disease: Secondary | ICD-10-CM

## 2014-03-26 DIAGNOSIS — E785 Hyperlipidemia, unspecified: Secondary | ICD-10-CM

## 2014-03-26 DIAGNOSIS — I2510 Atherosclerotic heart disease of native coronary artery without angina pectoris: Secondary | ICD-10-CM

## 2014-03-26 DIAGNOSIS — I10 Essential (primary) hypertension: Secondary | ICD-10-CM | POA: Diagnosis not present

## 2014-03-26 DIAGNOSIS — C61 Malignant neoplasm of prostate: Secondary | ICD-10-CM

## 2014-03-26 NOTE — Progress Notes (Signed)
Subjective:    Patient ID: Keith Hill, male    DOB: [birth year], 80 y.o.   MRN: [id number]  HPI Pt here for follow up and management of chronic medical problems. The patient is doing well with no particular complaints. He sees the cardiologist regularly and will see him sometime this month. He also is seeing the urologist and has a visit with him next week. The lab work has been completed and we will review this with the patient at the visit today. All of the patient's lab results were good and within normal limits and specifically his PSA, but this number does continue to rise slightly, and cholesterol numbers.        Patient Active Problem List   Diagnosis Date Noted  . Back pain 11/18/2013  . Vitamin D deficiency 08/28/2013  . Hiatal hernia with gastroesophageal reflux 08/28/2013  . CAD (coronary artery disease) 04/08/2013  . Essential and other specified forms of tremor 03/31/2013  . Parkinson's 11/07/2012  . Hemorrhoids 02/17/2011  . Hyperlipidemia   . Prostate cancer   . Hypertension   . Allergic rhinitis   . Colon polyps, history of    Outpatient Encounter Prescriptions as of 03/26/2014  Medication Sig  . amLODipine (NORVASC) 10 MG tablet TAKE (1/2) TABLET DAILY.  Marland Kitchen atorvastatin (LIPITOR) 40 MG tablet TAKE ONE TABLET AT BEDTIME  . carbidopa-levodopa (SINEMET IR) 25-100 MG per tablet Take 1 tablet by mouth 3 (three) times daily.  . Cholecalciferol (VITAMIN D3) 5000 UNITS CAPS Take 1 capsule by mouth 4 (four) times a week.   . diphenhydrAMINE (BENADRYL) 25 MG tablet Take 25 mg by mouth at bedtime as needed for itching (1/2 tablet).  Marland Kitchen docusate sodium (COLACE) 100 MG capsule Take 100 mg by mouth 2 (two) times daily. daily  . fluticasone (FLONASE) 50 MCG/ACT nasal spray INHALE 1 SPRAYS INTO EACH NOSTRIL ONCE DAILY  . Folic Acid-Vit V6-XIH W38 (FOLGARD RX 2.2 PO) Take 1 tablet by mouth daily.    [name] Oil 300 MG CAPS Take by mouth daily.  . Probiotic Product ([place]) Take by mouth 1 dose over 24 hours.    . rasagiline (AZILECT) 1 MG TABS tablet Take 1 tablet (1 mg total) by mouth daily.  Marland Kitchen terazosin (HYTRIN) 2 MG capsule TAKE (1) CAPSULE DAILY AT BEDTIME.  . valsartan-hydrochlorothiazide (DIOVAN-HCT) 320-12.5 MG per tablet Take 1/4 tablet daily    Review of Systems  Constitutional: Negative.   HENT: Negative.   Eyes: Negative.   Respiratory: Negative.   Cardiovascular: Negative.   Gastrointestinal: Negative.   Endocrine: Negative.   Genitourinary: Negative.   Musculoskeletal: Negative.   Skin: Negative.   Allergic/Immunologic: Negative.   Neurological: Negative.   Hematological: Negative.   Psychiatric/Behavioral: Negative.        Objective:   Physical Exam  Nursing note and vitals reviewed. Constitutional: He is oriented to person, place, and time. He appears well-developed and well-nourished. No distress.  HENT:  Head: Normocephalic and atraumatic.  Right Ear: External ear normal.  Left Ear: External ear normal.  Mouth/Throat: Oropharynx is clear and moist. No oropharyngeal exudate.  Nasal congestion present  Eyes: Conjunctivae and EOM are normal. Pupils are equal, round, and reactive to light. Right eye exhibits no discharge. Left eye exhibits no discharge. No scleral icterus.  Neck: Normal range of motion. Neck supple. No thyromegaly present.  Cardiovascular: Normal rate, regular rhythm and intact distal pulses.  Exam reveals friction rub. Exam reveals no  gallop.   No murmur heard. At 60 per minute  Pulmonary/Chest: Effort normal and breath sounds normal. No respiratory distress. He has no wheezes. He has no rales. He exhibits no tenderness.  Abdominal: Soft. Bowel sounds are normal. He exhibits no mass. There is no tenderness. There is no rebound and no guarding.  Genitourinary:  The PSA was recently checked and the patient will be seeing the urologist within the week  Musculoskeletal: Normal range of motion. He exhibits  no edema and no tenderness.  Lymphadenopathy:    He has no cervical adenopathy.  Neurological: He is alert and oriented to person, place, and time. He has normal reflexes. No cranial nerve deficit.  As far as the patient's tremor is concerned there was no noticeable tremor during the visit today.  Skin: Skin is warm and dry. No rash noted. No erythema.  Psychiatric: He has a normal mood and affect. His behavior is normal. Judgment and thought content normal.  The patient has a positive affect and demeanor and is staying active and exerting himself regularly with with physical exercise   BP 130/61  Pulse 75  Temp(Src) 97.2 F (36.2 C) (Oral)  Ht 5' 7.5" (1.715 m)  Wt 162 lb (73.483 kg)  BMI 24.98 kg/m2        Assessment & Plan:  1. Hiatal hernia with gastroesophageal reflux  2. Hyperlipidemia  3. Hypertension  4. Parkinson's -Continue followup with Dr. Jannifer Franklin  5. Prostate cancer -Continue followup with Dr. Jeffie Pollock  6. Vitamin D deficiency  Patient Instructions                       Medicare Annual Wellness Visit  [facility] and the medical providers at [facility] strive to bring you the best medical care.  In doing so we not only want to address your current medical conditions and concerns but also to detect new conditions early and prevent illness, disease and health-related problems.    Medicare offers a yearly Wellness Visit which allows our clinical staff to assess your need for preventative services including immunizations, lifestyle education, counseling to decrease risk of preventable diseases and screening for fall risk and other medical concerns.    This visit is provided free of charge (no copay) for all Medicare recipients. The clinical pharmacists at [facility] have begun to conduct these Wellness Visits which will also include a thorough review of all your medications.    As you primary medical provider recommend  that you make an appointment for your Annual Wellness Visit if you have not done so already this year.  You may set up this appointment before you leave today or you may call back ([phone]) and schedule an appointment.  Please make sure when you call that you mention that you are scheduling your Annual Wellness Visit with the clinical pharmacist so that the appointment may be made for the proper length of time.      Continue current medications. Continue good therapeutic lifestyle changes which include good diet and exercise. Fall precautions discussed with patient. If an FOBT was given today- please return it to our front desk. If you are over 77 years old - you may need Prevnar 81 or the adult Pneumonia vaccine.  Continue regular physical activity, this is very important Drink plenty of water.   Arrie Senate MD

## 2014-03-26 NOTE — Patient Instructions (Addendum)
Medicare Annual Wellness Visit  [facility] and the medical providers at [facility] strive to bring you the best medical care.  In doing so we not only want to address your current medical conditions and concerns but also to detect new conditions early and prevent illness, disease and health-related problems.    Medicare offers a yearly Wellness Visit which allows our clinical staff to assess your need for preventative services including immunizations, lifestyle education, counseling to decrease risk of preventable diseases and screening for fall risk and other medical concerns.    This visit is provided free of charge (no copay) for all Medicare recipients. The clinical pharmacists at [facility] have begun to conduct these Wellness Visits which will also include a thorough review of all your medications.    As you primary medical provider recommend that you make an appointment for your Annual Wellness Visit if you have not done so already this year.  You may set up this appointment before you leave today or you may call back ([phone]) and schedule an appointment.  Please make sure when you call that you mention that you are scheduling your Annual Wellness Visit with the clinical pharmacist so that the appointment may be made for the proper length of time.      Continue current medications. Continue good therapeutic lifestyle changes which include good diet and exercise. Fall precautions discussed with patient. If an FOBT was given today- please return it to our front desk. If you are over 25 years old - you may need Prevnar 49 or the adult Pneumonia vaccine.  Continue regular physical activity, this is very important Drink plenty of water.

## 2014-03-31 DIAGNOSIS — C61 Malignant neoplasm of prostate: Secondary | ICD-10-CM | POA: Diagnosis not present

## 2014-03-31 DIAGNOSIS — N402 Nodular prostate without lower urinary tract symptoms: Secondary | ICD-10-CM | POA: Diagnosis not present

## 2014-04-06 ENCOUNTER — Other Ambulatory Visit: Payer: Self-pay | Admitting: Family Medicine

## 2014-04-07 ENCOUNTER — Encounter: Payer: Self-pay | Admitting: Cardiology

## 2014-04-07 DIAGNOSIS — E785 Hyperlipidemia, unspecified: Secondary | ICD-10-CM | POA: Diagnosis not present

## 2014-04-07 DIAGNOSIS — G20 Parkinson's disease: Secondary | ICD-10-CM | POA: Diagnosis not present

## 2014-04-07 DIAGNOSIS — I2510 Atherosclerotic heart disease of native coronary artery without angina pectoris: Secondary | ICD-10-CM | POA: Diagnosis not present

## 2014-04-07 DIAGNOSIS — I10 Essential (primary) hypertension: Secondary | ICD-10-CM | POA: Diagnosis not present

## 2014-04-07 NOTE — Progress Notes (Signed)
Patient ID: Keith Hill, male   DOB: 1931-09-02, 80 y.o.   MRN: [id number]   [name], Poet  Date of visit:  04/07/2014 DOB:  [birth year]    Age:  80 yrs. Medical record number:  [id number]     Account number:  [id number] Primary Care Provider: [facility] ____________________________ CURRENT DIAGNOSES  1. CAD,Native  2. Hypertension-Essential (Benign)  3. Hyperlipidemia  4. Parkinson's Disease Nos ____________________________ ALLERGIES  Aspirin, Nausea ____________________________ MEDICATIONS  1. terazosin 2 mg Capsule, QHS  2. Stool Softener 100 mg Capsule, 1 p.o. daily  3. Keith Hill' Colon Health 1.5 billion cell Capsule, 1 p.o. daily  4. Diovan HCT 320-25 mg tablet, 1/4 tab qd  5. Folgard RX 2.2-25-1 mg tablet, 1 p.o. q.d.  6. atorvastatin 40 mg tablet, 1/2 tab 4 x week 1/4 tab 3 x week  7. Benadryl 25 mg capsule, 1/2 tab daily  8. Flonase 50 mcg/actuation spray,suspension, qd  9. krill oil 300-90-24-50 mg capsule, 1 p.o. daily  10. amlodipine 5 mg tablet, 1/2 tab daily  11. Azilect 1 mg tablet, 1 p.o. daily  12. carbidopa 25 mg-levodopa 100 mg tablet, TID  13. Vitamin D3 5,000 unit tablet, 4 x week ____________________________ CHIEF COMPLAINTS  Followup of CAD,Native  Followup of Hyperlipidemia, unspecified ____________________________ HISTORY OF PRESENT ILLNESS Patient seen for cardiac followup. He has been doing well since he was previously here. He is exercising on a regular basis and his lipids were reviewed today and show excellent control. He denies angina and has no PND, orthopnea, syncope, palpitations, or claudication. He is able to exercise on the treadmill 3 days a week and play golf. He does have Parkinson's but is under good control and does not limit his activities. ____________________________ PAST HISTORY  Past Medical Illnesses:  hypertension, hyperlipidemia, history of pancreatitis, GERD, BPH, osteoarthritis, Parkinson's disease, allergic rhiniits, lumbar disc  disease;  Cardiovascular Illnesses:  CAD;  Surgical Procedures:  knee surgery, left, shoulder surgery-left 2009, hemmoroidectomy;  Cardiology Procedures-Invasive:  cardiac cath (left) July 2002;  Cardiology Procedures-Noninvasive:  treadmill cardiolite July 2012, event monitor July 2012;  Cardiac Cath Results:  30% stenosis Left main, 60% stenosis ostial Diag 1, 60% stenosis mid LAD, 60% stenosis CFX, 50% stenosis PDA;  Peripheral Vascular Procedures:  abdominal U/S February 2007;  LVEF of 67% documented via nuclear study on 03/02/2011,   ____________________________ CARDIO-PULMONARY TEST DATES EKG Date:  04/08/2013;   Cardiac Cath Date:  03/18/2001;  Holter/Event Monitor Date: 03/03/2011;  Nuclear Study Date:  03/02/2011;  Chest Xray Date: 05/05/2003;   ____________________________ FAMILY HISTORY Brother -- Brother dead, Heart Attack Brother -- Brother dead, Alcoholism Father -- Father dead, Renal failure syndrome Mother -- Mother dead, Death of unknown cause Sister -- Sister dead, Death of unknown cause Sister -- Sister dead, Death of unknown cause ____________________________ SOCIAL HISTORY Alcohol Use:  no alcohol use;  Smoking:  never smoked;  Diet:  fat modified diet;  Lifestyle:  married;  Exercise:  exercises regularly;  Occupation:  retired and [employer];  Residence:  lives with wife;   ____________________________ REVIEW OF SYSTEMS General:  denies recent weight change, fatique or change in exercise tolerance. Respiratory: denies dyspnea, cough, wheezing or hemoptysis. Cardiovascular:  please review HPI Abdominal: change in bowel habits Genitourinary-Male: nocturia  Musculoskeletal:  arthritis of the neck Neurological:  see HPI  ____________________________ PHYSICAL EXAMINATION VITAL SIGNS  Blood Pressure:  104/70 Sitting, Right arm, regular cuff  , 110/68 Standing, Right arm and regular cuff  Pulse:  80/min. Weight:  165.00 lbs. Height:  69"BMI: 24  Constitutional:   pleasant white male in no acute distress Skin:  warm and dry to touch, no apparent skin lesions, or masses noted. Head:  normocephalic, normal hair pattern, no masses or tenderness Neck:  supple, no masses, thyromegaly, JVD. Carotid pulses are full and equal bilaterally without bruits. Chest:  normal symmetry, clear to auscultation. Cardiac:  regular rhythm, normal S1 and S2, No S3 or S4, no murmurs, gallops or rubs detected. Peripheral Pulses:  the femoral,dorsalis pedis, and posterior tibial pulses are full and equal bilaterally with no bruits auscultated. Extremities & Back:  no deformities, clubbing, cyanosis, erythema or edema observed. Normal muscle strength and tone. Neurological:  no gross motor or sensory deficits noted, affect appropriate, oriented x3. ____________________________ MOST RECENT LIPID PANEL 03/25/14  CHOL TOTL 148 mg/dl, LDL 76 NM, HDL 56 mg/dl and TRIGLYCER 81 mg/dl ____________________________ IMPRESSIONS/PLAN  1. Coronary artery disease treated medically 2. Hyperlipidemia under treatment who 3. Hypertension controlled  Recommendations:  Medicines and clinical status reviewed. Followup in one year with EKG ____________________________ TODAYS ORDERS  1. Return Visit: 1 year  2. 12 Lead EKG: 1 year                       ____________________________ Cardiology Physician:  Keith Hough MD [facility]

## 2014-04-14 ENCOUNTER — Encounter: Payer: Self-pay | Admitting: Neurology

## 2014-04-14 ENCOUNTER — Ambulatory Visit (INDEPENDENT_AMBULATORY_CARE_PROVIDER_SITE_OTHER): Payer: Medicare Other | Admitting: Neurology

## 2014-04-14 VITALS — BP 130/61 | HR 68 | Wt 164.0 lb

## 2014-04-14 DIAGNOSIS — G20 Parkinson's disease: Secondary | ICD-10-CM

## 2014-04-14 DIAGNOSIS — G252 Other specified forms of tremor: Secondary | ICD-10-CM

## 2014-04-14 DIAGNOSIS — G250 Essential tremor: Secondary | ICD-10-CM | POA: Diagnosis not present

## 2014-04-14 DIAGNOSIS — I2510 Atherosclerotic heart disease of native coronary artery without angina pectoris: Secondary | ICD-10-CM

## 2014-04-14 NOTE — Progress Notes (Signed)
Reason for visit: Parkinson's disease  Keith Hill is an 80 y.o. male  History of present illness:  Keith Hill is an 80 year old right-handed white male with a history of Parkinson's disease and a benign essential tremor. The patient has left-sided features with his Parkinson's disease. He remains active, playing golf 3 times a week, and he works out on the days that he is not playing golf. The patient denies any significant medical issues that have come up since last seen. He believes that his Parkinson's disease has been stable. He remains on Azilect. He believes that this medication has made his bowel movements the irregular. The patient denies any swallowing problems choking problems, or problems with falling. He sleeps well at night, and he denies any vivid dreams or hallucinations. He returns to this office for an evaluation.  Past Medical History  Diagnosis Date  . Pancreatitis 1976  . Lumbar disc disease   . Hyperlipidemia   . Hyperplasia of prostate   . Cardiac dysrhythmia, unspecified   . Allergic rhinitis, cause unspecified   . Colon polyps   . Hemorrhoids, external   . Hiatal hernia   . GERD (gastroesophageal reflux disease)   . Hypertension   . Essential and other specified forms of tremor   . Paralysis agitans 11/07/2012  . Prostate cancer   . Parkinson disease     dx 4-5 months ago  . Peyronie's disease     Past Surgical History  Procedure Laterality Date  . Knee surgery    . Shoulder surgery Left   . Hemorrhoid surgery    . Prostate biopsy  04/03/2013  . Prostate biopsy  11/01/2011    Family History  Problem Relation Age of Onset  . Stroke Father   . Kidney failure Father   . Alcoholism Brother   . Heart attack Brother   . Cancer Neg Hx     Social history:  reports that he has never smoked. He has never used smokeless tobacco. He reports that he does not drink alcohol or use illicit drugs.    Allergies  Allergen Reactions  . Aspirin     Upsets  stomach  . Aspirin Free Childs [Acetaminophen]   . Calicum Glycerophosphate   . Niaspan [Niacin Er]   . Prednisone   . Zocor [Simvastatin]     Medications:  Current Outpatient Prescriptions on File Prior to Visit  Medication Sig Dispense Refill  . amLODipine (NORVASC) 10 MG tablet TAKE (1/2) TABLET DAILY.  45 tablet  0  . atorvastatin (LIPITOR) 40 MG tablet TAKE ONE TABLET AT BEDTIME  90 tablet  1  . carbidopa-levodopa (SINEMET IR) 25-100 MG per tablet Take 1 tablet by mouth 3 (three) times daily.  270 tablet  3  . Cholecalciferol (VITAMIN D3) 5000 UNITS CAPS Take 1 capsule by mouth 4 (four) times a week.       . diphenhydrAMINE (BENADRYL) 25 MG tablet Take 25 mg by mouth at bedtime as needed for itching (1/2 tablet).      Marland Kitchen docusate sodium (COLACE) 100 MG capsule Take 100 mg by mouth 2 (two) times daily. daily      . fluticasone (FLONASE) 50 MCG/ACT nasal spray INHALE 1 SPRAYS INTO EACH NOSTRIL ONCE DAILY  16 g  2  . Folic Acid-Vit X9-BZJ I96 (FOLGARD RX 2.2 PO) Take 1 tablet by mouth daily.        Keith Hill Oil 300 MG CAPS Take by mouth daily.      Marland Kitchen  Probiotic Product (PHILLIPS COLON HEALTH PO) Take by mouth 1 dose over 24 hours.        . rasagiline (AZILECT) 1 MG TABS tablet Take 1 tablet (1 mg total) by mouth daily.  90 tablet  3  . terazosin (HYTRIN) 2 MG capsule TAKE (1) CAPSULE DAILY AT BEDTIME.  90 capsule  0  . valsartan-hydrochlorothiazide (DIOVAN-HCT) 320-12.5 MG per tablet Take 1/4 tablet daily       No current facility-administered medications on file prior to visit.    ROS:  Out of a complete 14 system review of symptoms, the patient complains only of the following symptoms, and all other reviewed systems are negative.  Diarrhea Insomnia Back pain Tremors  Blood pressure 130/61, pulse 68, weight 164 lb (74.39 kg).  Physical Exam  General: The patient is alert and cooperative at the time of the examination.  Skin: No significant peripheral edema is  noted.   Neurologic Exam  Mental status: The patient is oriented x 3.  Cranial nerves: Facial symmetry is present. Speech is normal, no aphasia or dysarthria is noted. Extraocular movements are full. Visual fields are full.  Motor: The patient has good strength in all 4 extremities.  Sensory examination: Soft touch sensation on the face, arms, and legs is symmetric.  Coordination: The patient has good finger-nose-finger and heel-to-shin bilaterally. The patient has intention tremors with finger-nose-finger bilaterally. There is a resting tremor with the left upper extremity.  Gait and station: The patient has a normal gait, with good arm swing on the right decreased on the left, and there is a resting tremor with walking on the left. Tandem gait is normal. Romberg is negative. No drift is seen.  Reflexes: Deep tendon reflexes are symmetric.   Assessment/Plan:  1. Parkinson's disease, left-sided tremor  2. Benign essential tremor  The patient will continue the current dose of Sinemet and Azilect, and he will followup in 5 months. The patient is remaining quite active, doing well with this.  Jill Alexanders MD 04/14/2014 8:14 PM  [facility] [address] [place], [place] [address]  Phone [phone] Fax [phone]

## 2014-04-14 NOTE — Patient Instructions (Signed)
Parkinson Disease Parkinson disease is a disorder of the central nervous system, which includes the brain and spinal cord. A person with this disease slowly loses the ability to completely control body movements. Within the brain, there is a group of nerve cells (basal ganglia) that help control movement. The basal ganglia are damaged and do not work properly in a person with Parkinson disease. In addition, the basal ganglia produce and use a brain chemical called dopamine. The dopamine chemical sends messages to other parts of the body to control and coordinate body movements. Dopamine levels are low in a person with Parkinson disease. If the dopamine levels are low, then the body does not receive the correct messages it needs to move normally.  CAUSES  The exact reason why the basal ganglia get damaged is not known. Some medical researchers have thought that infection, genes, environment, and certain medicines may contribute to the cause.  SYMPTOMS   An early symptom of Parkinson disease is often an uncontrolled shaking (tremor) of the hands. The tremor will often disappear when the affected hand is consciously used.  As the disease progresses, walking, talking, getting out of a chair, and new movements become more difficult.  Muscles get stiff and movements become slower.  Balance and coordination become harder.  Depression, trouble swallowing, urinary problems, constipation, and sleep problems can occur.  Later in the disease, memory and thought processes may deteriorate. DIAGNOSIS  There are no specific tests to diagnose Parkinson disease. You may be referred to a neurologist for evaluation. Your caregiver will ask about your medical history, symptoms, and perform a physical exam. Blood tests and imaging tests of your brain may be performed to rule out other diseases. The imaging tests may include an MRI or a CT scan. TREATMENT  The goal of treatment is to relieve symptoms. Medicines may be  prescribed once the symptoms become troublesome. Medicine will not stop the progression of the disease, but medicine can make movement and balance better and help control tremors. Speech and occupational therapy may also be prescribed. Sometimes, surgical treatment of the brain can be done in young people. HOME CARE INSTRUCTIONS  Get regular exercise and rest periods during the day to help prevent exhaustion and depression.  If getting dressed becomes difficult, replace buttons and zippers with Velcro and elastic on your clothing.  Take all medicine as directed by your caregiver.  Install grab bars or railings in your home to prevent falls.  Go to speech or occupational therapy as directed.  Keep all follow-up visits as directed by your caregiver. SEEK MEDICAL CARE IF:  Your symptoms are not controlled with your medicine.  You fall.  You have trouble swallowing or choke on your food. MAKE SURE YOU:  Understand these instructions.  Will watch your condition.  Will get help right away if you are not doing well or get worse. Document Released: 08/04/2000 Document Revised: 12/02/2012 Document Reviewed: 09/06/2011 ExitCare Patient Information 2015 [employer]. This information is not intended to replace advice given to you by your health care provider. Make sure you discuss any questions you have with your health care provider.  

## 2014-05-25 ENCOUNTER — Other Ambulatory Visit: Payer: Self-pay | Admitting: Family Medicine

## 2014-06-01 ENCOUNTER — Encounter (INDEPENDENT_AMBULATORY_CARE_PROVIDER_SITE_OTHER): Payer: Self-pay

## 2014-06-01 ENCOUNTER — Ambulatory Visit (INDEPENDENT_AMBULATORY_CARE_PROVIDER_SITE_OTHER): Payer: Medicare Other

## 2014-06-01 DIAGNOSIS — Z23 Encounter for immunization: Secondary | ICD-10-CM

## 2014-06-02 ENCOUNTER — Ambulatory Visit: Payer: Medicare Other

## 2014-06-11 ENCOUNTER — Ambulatory Visit (INDEPENDENT_AMBULATORY_CARE_PROVIDER_SITE_OTHER): Payer: Medicare Other | Admitting: Nurse Practitioner

## 2014-06-11 VITALS — BP 103/58 | HR 91 | Temp 97.0°F | Ht 67.0 in | Wt 168.0 lb

## 2014-06-11 DIAGNOSIS — H6693 Otitis media, unspecified, bilateral: Secondary | ICD-10-CM | POA: Diagnosis not present

## 2014-06-11 NOTE — Progress Notes (Signed)
   Subjective:    Patient ID: Keith Hill, male    DOB: [birth year], 80 y.o.   MRN: [id number]  HPI Patient is here today for left ear problems. He denies any pain. He reports feeling like "speaking in a box".  He denies any recent swimming. Denies any frequent ear problem, any dizziness. Denies any sensation of pressure.    Review of Systems  HENT: Negative for ear discharge and ear pain.   All other systems reviewed and are negative.      Objective:   Physical Exam  Constitutional: He is oriented to person, place, and time. He appears well-developed and well-nourished.  HENT:  Head: Normocephalic.  Right Ear: No tenderness. Tympanic membrane is not erythematous and not bulging. No middle ear effusion.  Left Ear: Tympanic membrane is not injected, not perforated and not erythematous.  Eyes: Pupils are equal, round, and reactive to light.  Neck: Normal range of motion.  Cardiovascular: Normal rate.   Pulmonary/Chest: Effort normal.  Abdominal: Soft.  Musculoskeletal: Normal range of motion.  Neurological: He is alert and oriented to person, place, and time.  Skin: Skin is warm.  Psychiatric: He has a normal mood and affect.    BP 103/58  Pulse 91  Temp(Src) 97 F (36.1 C) (Oral)  Ht 5\' 7"  (1.702 m)  Wt 168 lb (76.204 kg)  BMI 26.31 kg/m2       Assessment & Plan:   1. Bilateral chronic otitis media, unspecified otitis media type    force fluids flonase OTC RTO prn  Mary-Margaret Hassell Done, FNP

## 2014-07-07 ENCOUNTER — Other Ambulatory Visit: Payer: Self-pay | Admitting: Family Medicine

## 2014-07-21 ENCOUNTER — Other Ambulatory Visit (INDEPENDENT_AMBULATORY_CARE_PROVIDER_SITE_OTHER): Payer: Medicare Other

## 2014-07-21 DIAGNOSIS — E559 Vitamin D deficiency, unspecified: Secondary | ICD-10-CM | POA: Diagnosis not present

## 2014-07-21 DIAGNOSIS — I10 Essential (primary) hypertension: Secondary | ICD-10-CM

## 2014-07-21 DIAGNOSIS — E785 Hyperlipidemia, unspecified: Secondary | ICD-10-CM

## 2014-07-21 DIAGNOSIS — R0789 Other chest pain: Secondary | ICD-10-CM | POA: Diagnosis not present

## 2014-07-21 LAB — POCT CBC
Granulocyte percent: 71.1 %G (ref 37–80)
HCT, POC: 39.3 % — AB (ref 43.5–53.7)
HEMOGLOBIN: 13.2 g/dL — AB (ref 14.1–18.1)
LYMPH, POC: 1.6 (ref 0.6–3.4)
MCH, POC: 33.1 pg — AB (ref 27–31.2)
MCHC: 33.7 g/dL (ref 31.8–35.4)
MCV: 98.4 fL — AB (ref 80–97)
MPV: 7.8 fL (ref 0–99.8)
POC Granulocyte: 5.5 (ref 2–6.9)
POC LYMPH %: 21.3 % (ref 10–50)
Platelet Count, POC: 261 10*3/uL (ref 142–424)
RBC: 4 M/uL — AB (ref 4.69–6.13)
RDW, POC: 12.9 %
WBC: 7.7 10*3/uL (ref 4.6–10.2)

## 2014-07-22 LAB — HEPATIC FUNCTION PANEL
ALBUMIN: 4.3 g/dL (ref 3.5–4.7)
ALT: 44 IU/L (ref 0–44)
AST: 32 IU/L (ref 0–40)
Alkaline Phosphatase: 87 IU/L (ref 39–117)
BILIRUBIN DIRECT: 0.15 mg/dL (ref 0.00–0.40)
Total Bilirubin: 0.5 mg/dL (ref 0.0–1.2)
Total Protein: 7.1 g/dL (ref 6.0–8.5)

## 2014-07-22 LAB — BMP8+EGFR
BUN/Creatinine Ratio: 18 (ref 10–22)
BUN: 21 mg/dL (ref 8–27)
CALCIUM: 9.1 mg/dL (ref 8.6–10.2)
CO2: 25 mmol/L (ref 18–29)
Chloride: 101 mmol/L (ref 97–108)
Creatinine, Ser: 1.18 mg/dL (ref 0.76–1.27)
GFR calc non Af Amer: 58 mL/min/{1.73_m2} — ABNORMAL LOW (ref >59)
GFR, EST AFRICAN AMERICAN: 67 mL/min/{1.73_m2} (ref >59)
Glucose: 98 mg/dL (ref 65–99)
POTASSIUM: 3.7 mmol/L (ref 3.5–5.2)
Sodium: 142 mmol/L (ref 134–144)

## 2014-07-22 LAB — NMR, LIPOPROFILE
CHOLESTEROL: 141 mg/dL (ref 100–199)
HDL Cholesterol by NMR: 60 mg/dL (ref >39)
HDL Particle Number: 32.8 umol/L (ref >=30.5)
LDL PARTICLE NUMBER: 558 nmol/L (ref <1000)
LDL Size: 21.7 nm (ref >20.5)
LDL-C: 71 mg/dL (ref 0–99)
LP-IR Score: <25 (ref <=45)
Small LDL Particle Number: 123 nmol/L (ref <=527)
TRIGLYCERIDES BY NMR: 52 mg/dL (ref 0–149)

## 2014-07-22 LAB — VITAMIN D 25 HYDROXY (VIT D DEFICIENCY, FRACTURES): Vit D, 25-Hydroxy: 46.7 ng/mL (ref 30.0–100.0)

## 2014-07-23 ENCOUNTER — Telehealth: Payer: Self-pay | Admitting: *Deleted

## 2014-07-23 NOTE — Telephone Encounter (Signed)
ttc pt no answer, was calling regarding test results.

## 2014-07-23 NOTE — Telephone Encounter (Signed)
-----   Message from Chipper Herb, MD sent at 07/22/2014  8:55 PM EST ----- The sugar is good at 98. The creatinine, the most important kidney function test is within normal limits. All cholesterol numbers with advanced lipid testing are excellent and at goal. Continue current treatment and aggressive therapeutic lifestyle changes which include diet and exercise All liver function tests are within normal limits The vitamin D level is good and within normal limits, continue current treatment--- continue current treatment

## 2014-07-27 NOTE — Telephone Encounter (Signed)
-----   Message from Chipper Herb, MD sent at 07/22/2014  8:55 PM EST ----- The sugar is good at 98. The creatinine, the most important kidney function test is within normal limits. All cholesterol numbers with advanced lipid testing are excellent and at goal. Continue current treatment and aggressive therapeutic lifestyle changes which include diet and exercise All liver function tests are within normal limits The vitamin D level is good and within normal limits, continue current treatment--- continue current treatment

## 2014-07-30 ENCOUNTER — Ambulatory Visit (INDEPENDENT_AMBULATORY_CARE_PROVIDER_SITE_OTHER): Payer: Medicare Other | Admitting: Family Medicine

## 2014-07-30 ENCOUNTER — Encounter: Payer: Self-pay | Admitting: Family Medicine

## 2014-07-30 ENCOUNTER — Ambulatory Visit (INDEPENDENT_AMBULATORY_CARE_PROVIDER_SITE_OTHER): Payer: Medicare Other

## 2014-07-30 VITALS — BP 138/67 | HR 68 | Temp 97.9°F | Ht 67.0 in | Wt 166.0 lb

## 2014-07-30 DIAGNOSIS — G20 Parkinson's disease: Secondary | ICD-10-CM | POA: Diagnosis not present

## 2014-07-30 DIAGNOSIS — M25511 Pain in right shoulder: Secondary | ICD-10-CM

## 2014-07-30 DIAGNOSIS — I2510 Atherosclerotic heart disease of native coronary artery without angina pectoris: Secondary | ICD-10-CM

## 2014-07-30 DIAGNOSIS — I10 Essential (primary) hypertension: Secondary | ICD-10-CM

## 2014-07-30 DIAGNOSIS — E559 Vitamin D deficiency, unspecified: Secondary | ICD-10-CM

## 2014-07-30 DIAGNOSIS — M6530 Trigger finger, unspecified finger: Secondary | ICD-10-CM | POA: Diagnosis not present

## 2014-07-30 DIAGNOSIS — E785 Hyperlipidemia, unspecified: Secondary | ICD-10-CM

## 2014-07-30 DIAGNOSIS — C61 Malignant neoplasm of prostate: Secondary | ICD-10-CM

## 2014-07-30 NOTE — Progress Notes (Signed)
Subjective:    Patient ID: Keith Hill, male    DOB: Feb 12, 1932, 80 y.o.   MRN: [id number]  HPI Pt here for follow up and management of chronic medical problems. The patient also complains of sinus pressure and congestion and right ring finger and right shoulder pain. He is followed by the cardiologist on a regular basis. The patient's recent lab work will be reviewed with him today. All of the lab work is basically stable. The hemoglobin remains slightly decreased at 13.2 but this is consistent with past readings. His cholesterol numbers were excellent. He was given a copy of his lab work.         Patient Active Problem List   Diagnosis Date Noted  . Back pain 11/18/2013  . Vitamin D deficiency 08/28/2013  . Hiatal hernia with gastroesophageal reflux 08/28/2013  . CAD (coronary artery disease) 04/08/2013  . Essential and other specified forms of tremor 03/31/2013  . Parkinson's 11/07/2012  . Hemorrhoids 02/17/2011  . Hyperlipidemia   . Prostate cancer   . Hypertension   . Allergic rhinitis   . Colon polyps, history of    Outpatient Encounter Prescriptions as of 07/30/2014  Medication Sig  . amLODipine (NORVASC) 10 MG tablet TAKE (1/2) TABLET DAILY.  Marland Kitchen atorvastatin (LIPITOR) 40 MG tablet TAKE ONE TABLET AT BEDTIME  . carbidopa-levodopa (SINEMET IR) 25-100 MG per tablet Take 1 tablet by mouth 3 (three) times daily.  . Cholecalciferol (VITAMIN D3) 5000 UNITS CAPS Take 1 capsule by mouth 4 (four) times a week.   . diphenhydrAMINE (BENADRYL) 25 MG tablet Take 25 mg by mouth at bedtime as needed for itching (1/2 tablet).  Marland Kitchen docusate sodium (COLACE) 100 MG capsule Take 100 mg by mouth 2 (two) times daily. daily  . fluticasone (FLONASE) 50 MCG/ACT nasal spray INHALE 1 SPRAYS INTO EACH NOSTRIL ONCE DAILY  . Folic Acid-Vit H9-QQI W97 (FOLGARD RX 2.2 PO) Take 1 tablet by mouth daily.    Keith Hill Oil 300 MG CAPS Take by mouth daily.  . Probiotic Product (Keith Hill) Take  by mouth 1 dose over 24 hours.    . rasagiline (AZILECT) 1 MG TABS tablet Take 1 tablet (1 mg total) by mouth daily.  Marland Kitchen terazosin (HYTRIN) 2 MG capsule TAKE (1) CAPSULE DAILY AT BEDTIME.  . valsartan-hydrochlorothiazide (DIOVAN-HCT) 320-12.5 MG per tablet Take 1/4 tablet daily    Review of Systems  Constitutional: Negative.   HENT: Positive for congestion and sinus pressure.   Eyes: Negative.   Respiratory: Negative.   Cardiovascular: Negative.   Gastrointestinal: Negative.   Endocrine: Negative.   Genitourinary: Negative.   Musculoskeletal: Positive for arthralgias (right shoulder and right ringer finger pain).  Skin: Negative.   Allergic/Immunologic: Negative.   Neurological: Negative.   Hematological: Negative.   Psychiatric/Behavioral: Negative.        Objective:   Physical Exam  Constitutional: He is oriented to person, place, and time. He appears well-developed and well-nourished. No distress.  Patient looks great and looks much younger than his stated age of soon[age]to-be 38.  HENT:  Head: Normocephalic and atraumatic.  Right Ear: External ear normal.  Left Ear: External ear normal.  Nose: Nose normal.  Mouth/Throat: Oropharynx is clear and moist. No oropharyngeal exudate.  Minimal nasal congestion  Eyes: Conjunctivae and EOM are normal. Pupils are equal, round, and reactive to light. Right eye exhibits no discharge. Left eye exhibits no discharge. No scleral icterus.  Neck: Normal range of motion. Neck  supple. No thyromegaly present.  No thyromegaly or anterior cervical nodes  Cardiovascular: Normal rate, regular rhythm and intact distal pulses.  Exam reveals no gallop and no friction rub.   Murmur heard. The heart has a regular rate and rhythm at 60/m with a grade 1 to 2/6 systolic ejection murmur.  Pulmonary/Chest: Effort normal and breath sounds normal. No respiratory distress. He has no wheezes. He has no rales. He exhibits no tenderness.  Abdominal: Soft. Bowel  sounds are normal. He exhibits no mass. There is no tenderness. There is no rebound and no guarding.  The abdomen is nontender without masses or organ enlargement. There are no inguinal nodes.  Genitourinary:  The patient continues to be followed by the urologist, Dr. Jeffie Hill for his prostate cancer  Musculoskeletal: Normal range of motion. He exhibits no edema or tenderness.  There was tenderness in the deltoid area on the right side. The patient had good mobility and was able to adduct and abduct his arm.  Lymphadenopathy:    He has no cervical adenopathy.  Neurological: He is alert and oriented to person, place, and time. He has normal reflexes. No cranial nerve deficit.  There is a slight tremor in his right hand that was barely noticeable today.  Skin: Skin is warm and dry. No rash noted. No erythema. No pallor.  Psychiatric: He has a normal mood and affect. His behavior is normal. Judgment and thought content normal.  Nursing note and vitals reviewed.  BP 138/67 mmHg  Pulse 68  Temp(Src) 97.9 F (36.6 C) (Oral)  Ht 5\' 7"  (1.702 m)  Wt 166 lb (75.297 kg)  BMI 25.99 kg/m2  [facility] reading (PRIMARY) by  Dr. Governor Hill shoulder-- calcific tendinitis and degenerative changes                                 60 of Depo-Medrol to trigger point tenderness in the right deltoid area without problems      Assessment & Plan:  1. Hypertension  2. Vitamin D deficiency  3. Prostate cancer  4. Parkinson's  5. Hyperlipemia  6. Right shoulder pain - DG Shoulder Right; Future  7. Trigger finger of right hand  No orders of the defined types were placed in this encounter.   Patient Instructions                       Medicare Annual Wellness Visit  [facility] and the medical providers at [facility] strive to bring you the best medical care.  In doing so we not only want to address your current medical conditions and concerns but also to detect new conditions early  and prevent illness, disease and health-related problems.    Medicare offers a yearly Wellness Visit which allows our clinical staff to assess your need for preventative services including immunizations, lifestyle education, counseling to decrease risk of preventable diseases and screening for fall risk and other medical concerns.    This visit is provided free of charge (no copay) for all Medicare recipients. The clinical pharmacists at [facility] have begun to conduct these Wellness Visits which will also include a thorough review of all your medications.    As you primary medical provider recommend that you make an appointment for your Annual Wellness Visit if you have not done so already this year.  You may set up this appointment before you leave  today or you may call back ([phone]) and schedule an appointment.  Please make sure when you call that you mention that you are scheduling your Annual Wellness Visit with the clinical pharmacist so that the appointment may be made for the proper length of time.     Continue current medications. Continue good therapeutic lifestyle changes which include good diet and exercise. Fall precautions discussed with patient. If an FOBT was given today- please return it to our front desk. If you are over 39 years old - you may need Prevnar 39 or the adult Pneumonia vaccine.  Flu Shots will be available at our office starting mid- September. Please call and schedule a FLU CLINIC APPOINTMENT.   Continue with your current exercise regimen. This is great for your heart, your joints, and her Parkinson's. Continue to drink plenty of fluids Take Tylenol as needed for aches and pains Use saline nose spray during the day and continue to use the Flonase at nighttime Keep the house as cool as possible Wear the Band-Aid as directed on your ring finger at nighttime Continue follow-up visits with cardiology, urology   Arrie Senate MD

## 2014-07-30 NOTE — Patient Instructions (Addendum)
Medicare Annual Wellness Visit  [facility] and the medical providers at [facility] strive to bring you the best medical care.  In doing so we not only want to address your current medical conditions and concerns but also to detect new conditions early and prevent illness, disease and health-related problems.    Medicare offers a yearly Wellness Visit which allows our clinical staff to assess your need for preventative services including immunizations, lifestyle education, counseling to decrease risk of preventable diseases and screening for fall risk and other medical concerns.    This visit is provided free of charge (no copay) for all Medicare recipients. The clinical pharmacists at [facility] have begun to conduct these Wellness Visits which will also include a thorough review of all your medications.    As you primary medical provider recommend that you make an appointment for your Annual Wellness Visit if you have not done so already this year.  You may set up this appointment before you leave today or you may call back ([phone]) and schedule an appointment.  Please make sure when you call that you mention that you are scheduling your Annual Wellness Visit with the clinical pharmacist so that the appointment may be made for the proper length of time.     Continue current medications. Continue good therapeutic lifestyle changes which include good diet and exercise. Fall precautions discussed with patient. If an FOBT was given today- please return it to our front desk. If you are over 42 years old - you may need Prevnar 70 or the adult Pneumonia vaccine.  Flu Shots will be available at our office starting mid- September. Please call and schedule a FLU CLINIC APPOINTMENT.   Continue with your current exercise regimen. This is great for your heart, your joints, and her Parkinson's. Continue to drink plenty of fluids Take  Tylenol as needed for aches and pains Use saline nose spray during the day and continue to use the Flonase at nighttime Keep the house as cool as possible Wear the Band-Aid as directed on your ring finger at nighttime Continue follow-up visits with cardiology, urology

## 2014-07-31 ENCOUNTER — Telehealth: Payer: Self-pay

## 2014-07-31 NOTE — Telephone Encounter (Signed)
-----   Message from Chipper Herb, MD sent at 07/30/2014 12:32 PM EST ----- As per radiology report

## 2014-07-31 NOTE — Telephone Encounter (Signed)
Pt aware of results 

## 2014-09-15 ENCOUNTER — Ambulatory Visit (INDEPENDENT_AMBULATORY_CARE_PROVIDER_SITE_OTHER): Payer: Medicare Other | Admitting: Neurology

## 2014-09-15 ENCOUNTER — Encounter: Payer: Self-pay | Admitting: Neurology

## 2014-09-15 VITALS — BP 125/67 | HR 73 | Ht 68.0 in | Wt 167.4 lb

## 2014-09-15 DIAGNOSIS — G20 Parkinson's disease: Secondary | ICD-10-CM | POA: Diagnosis not present

## 2014-09-15 DIAGNOSIS — G252 Other specified forms of tremor: Secondary | ICD-10-CM

## 2014-09-15 DIAGNOSIS — R251 Tremor, unspecified: Secondary | ICD-10-CM | POA: Diagnosis not present

## 2014-09-15 DIAGNOSIS — G250 Essential tremor: Secondary | ICD-10-CM

## 2014-09-15 MED ORDER — RASAGILINE MESYLATE 1 MG PO TABS: 1.0000 mg | ORAL_TABLET | Freq: Every day | ORAL | Status: DC

## 2014-09-15 MED ORDER — CARBIDOPA-LEVODOPA 25-100 MG PO TABS: 1.0000 | ORAL_TABLET | Freq: Three times a day (TID) | ORAL | Status: DC

## 2014-09-15 NOTE — Patient Instructions (Signed)
Parkinson Disease Parkinson disease is a disorder of the central nervous system, which includes the brain and spinal cord. A person with this disease slowly loses the ability to completely control body movements. Within the brain, there is a group of nerve cells (basal ganglia) that help control movement. The basal ganglia are damaged and do not work properly in a person with Parkinson disease. In addition, the basal ganglia produce and use a brain chemical called dopamine. The dopamine chemical sends messages to other parts of the body to control and coordinate body movements. Dopamine levels are low in a person with Parkinson disease. If the dopamine levels are low, then the body does not receive the correct messages it needs to move normally.  CAUSES  The exact reason why the basal ganglia get damaged is not known. Some medical researchers have thought that infection, genes, environment, and certain medicines may contribute to the cause.  SYMPTOMS   An early symptom of Parkinson disease is often an uncontrolled shaking (tremor) of the hands. The tremor will often disappear when the affected hand is consciously used.  As the disease progresses, walking, talking, getting out of a chair, and new movements become more difficult.  Muscles get stiff and movements become slower.  Balance and coordination become harder.  Depression, trouble swallowing, urinary problems, constipation, and sleep problems can occur.  Later in the disease, memory and thought processes may deteriorate. DIAGNOSIS  There are no specific tests to diagnose Parkinson disease. You may be referred to a neurologist for evaluation. Your caregiver will ask about your medical history, symptoms, and perform a physical exam. Blood tests and imaging tests of your brain may be performed to rule out other diseases. The imaging tests may include an MRI or a CT scan. TREATMENT  The goal of treatment is to relieve symptoms. Medicines may be  prescribed once the symptoms become troublesome. Medicine will not stop the progression of the disease, but medicine can make movement and balance better and help control tremors. Speech and occupational therapy may also be prescribed. Sometimes, surgical treatment of the brain can be done in young people. HOME CARE INSTRUCTIONS  Get regular exercise and rest periods during the day to help prevent exhaustion and depression.  If getting dressed becomes difficult, replace buttons and zippers with Velcro and elastic on your clothing.  Take all medicine as directed by your caregiver.  Install grab bars or railings in your home to prevent falls.  Go to speech or occupational therapy as directed.  Keep all follow-up visits as directed by your caregiver. SEEK MEDICAL CARE IF:  Your symptoms are not controlled with your medicine.  You fall.  You have trouble swallowing or choke on your food. MAKE SURE YOU:  Understand these instructions.  Will watch your condition.  Will get help right away if you are not doing well or get worse. Document Released: 08/04/2000 Document Revised: 12/02/2012 Document Reviewed: 09/06/2011 ExitCare Patient Information 2015 [employer]. This information is not intended to replace advice given to you by your health care provider. Make sure you discuss any questions you have with your health care provider.  

## 2014-09-15 NOTE — Progress Notes (Signed)
Reason for visit: Parkinson's disease  Keith Hill is an 81 y.o. male  History of present illness:  Keith Hill is an 81 year old right-handed white male with a history of Parkinson's disease primarily with right-sided features. The patient is doing very well with his disease, he continues to remain active, playing golf and exercising 3 times a week. The patient has had one fall 2 weeks ago associated with a severe leg cramp that affected the calf and thigh muscle on the right leg. The patient did not sustain significant injury with the fall. The patient has not had any problems with speech or swallowing. The patient does not use a cane or any other assistive device for ambulation. He is tolerating the Sinemet and the Azilect well. He returns this office for an evaluation.  Past Medical History  Diagnosis Date  . Pancreatitis 1976  . Lumbar disc disease   . Hyperlipidemia   . Hyperplasia of prostate   . Cardiac dysrhythmia, unspecified   . Allergic rhinitis, cause unspecified   . Colon polyps   . Hemorrhoids, external   . Hiatal hernia   . GERD (gastroesophageal reflux disease)   . Hypertension   . Essential and other specified forms of tremor   . Paralysis agitans 11/07/2012  . Prostate cancer   . Parkinson disease     dx 4-5 months ago  . Peyronie's disease     Past Surgical History  Procedure Laterality Date  . Knee surgery    . Shoulder surgery Left   . Hemorrhoid surgery    . Prostate biopsy  04/03/2013  . Prostate biopsy  11/01/2011    Family History  Problem Relation Age of Onset  . Stroke Father   . Kidney failure Father   . Alcoholism Brother   . Heart attack Brother   . Cancer Neg Hx     Social history:  reports that he has never smoked. He has never used smokeless tobacco. He reports that he does not drink alcohol or use illicit drugs.    Allergies  Allergen Reactions  . Aspirin     Upsets stomach  . Aspirin Free Childs [Acetaminophen]   . Calicum  Glycerophosphate   . Niaspan [Niacin Er]   . Prednisone   . Zocor [Simvastatin]     Medications:  Current Outpatient Prescriptions on File Prior to Visit  Medication Sig Dispense Refill  . amLODipine (NORVASC) 10 MG tablet TAKE (1/2) TABLET DAILY. 45 tablet 1  . atorvastatin (LIPITOR) 40 MG tablet TAKE ONE TABLET AT BEDTIME 90 tablet 1  . Cholecalciferol (VITAMIN D3) 5000 UNITS CAPS Take 1 capsule by mouth 4 (four) times a week.     . diphenhydrAMINE (BENADRYL) 25 MG tablet Take 25 mg by mouth at bedtime as needed for itching (1/2 tablet).    Marland Kitchen docusate sodium (COLACE) 100 MG capsule Take 100 mg by mouth 2 (two) times daily. daily    . fluticasone (FLONASE) 50 MCG/ACT nasal spray INHALE 1 SPRAYS INTO EACH NOSTRIL ONCE DAILY 16 g 2  . Folic Acid-Vit H5-KTG Y56 (FOLGARD RX 2.2 PO) Take 1 tablet by mouth daily.      [name] Oil 300 MG CAPS Take by mouth daily.    . Probiotic Product ([place]) Take by mouth 1 dose over 24 hours.      Marland Kitchen terazosin (HYTRIN) 2 MG capsule TAKE (1) CAPSULE DAILY AT BEDTIME. 90 capsule 0  . valsartan-hydrochlorothiazide (DIOVAN-HCT) 320-12.5 MG per  tablet Take 1/4 tablet daily     No current facility-administered medications on file prior to visit.    ROS:  Out of a complete 14 system review of symptoms, the patient complains only of the following symptoms, and all other reviewed systems are negative.  Tremors Back pain, muscle cramps  Blood pressure 125/67, pulse 73, height 5\' 8"  (1.727 m), weight 167 lb 6.4 oz (75.932 kg).  Physical Exam  General: The patient is alert and cooperative at the time of the examination.  Skin: No significant peripheral edema is noted.   Neurologic Exam  Mental status: The patient is oriented x 3.  Cranial nerves: Facial symmetry is present. Speech is normal, no aphasia or dysarthria is noted. Extraocular movements are full. Visual fields are full.  Motor: The patient has good strength in all 4  extremities.  Sensory examination: Soft touch sensation is symmetric on the face, arms, and legs.  Coordination: The patient has good finger-nose-finger and heel-to-shin bilaterally. The patient has a resting tremor involving the right upper extremity.  Gait and station: The patient has a normal gait. The patient is slightly stooped, with a tremor affecting the right arm with walking. Tandem gait is normal. Romberg is negative. No drift is seen.  Reflexes: Deep tendon reflexes are symmetric.   Assessment/Plan:  1. Parkinson's disease  The patient remains very active, he is progressing very slowly with his Parkinson's disease. He will continue his current medication regimen, and he will follow-up in approximately 5 months.  Jill Alexanders MD 09/15/2014 6:46 PM  [facility] [address] [place], [place] [address]  Phone [phone] Fax [phone]

## 2014-09-24 ENCOUNTER — Other Ambulatory Visit (INDEPENDENT_AMBULATORY_CARE_PROVIDER_SITE_OTHER): Payer: Medicare Other

## 2014-09-24 DIAGNOSIS — C61 Malignant neoplasm of prostate: Secondary | ICD-10-CM | POA: Diagnosis not present

## 2014-09-24 NOTE — Progress Notes (Signed)
Labs for dr. Jeffie Pollock

## 2014-09-25 LAB — PSA, TOTAL AND FREE
PSA FREE PCT: 22.3 %
PSA FREE: 0.69 ng/mL
PSA: 3.1 ng/mL (ref 0.0–4.0)

## 2014-09-29 ENCOUNTER — Other Ambulatory Visit: Payer: Self-pay | Admitting: Family Medicine

## 2014-10-01 DIAGNOSIS — C61 Malignant neoplasm of prostate: Secondary | ICD-10-CM | POA: Diagnosis not present

## 2014-10-01 DIAGNOSIS — N402 Nodular prostate without lower urinary tract symptoms: Secondary | ICD-10-CM | POA: Diagnosis not present

## 2014-10-01 DIAGNOSIS — R351 Nocturia: Secondary | ICD-10-CM | POA: Diagnosis not present

## 2014-10-20 ENCOUNTER — Other Ambulatory Visit: Payer: Self-pay | Admitting: Family Medicine

## 2014-11-03 ENCOUNTER — Other Ambulatory Visit: Payer: Self-pay | Admitting: Family Medicine

## 2014-11-19 ENCOUNTER — Encounter: Payer: Self-pay | Admitting: *Deleted

## 2014-11-19 ENCOUNTER — Ambulatory Visit (INDEPENDENT_AMBULATORY_CARE_PROVIDER_SITE_OTHER): Payer: Medicare Other | Admitting: *Deleted

## 2014-11-19 VITALS — BP 113/58 | HR 78 | Resp 20 | Ht 68.5 in | Wt 172.0 lb

## 2014-11-19 DIAGNOSIS — Z0000 Encounter for general adult medical examination without abnormal findings: Secondary | ICD-10-CM

## 2014-11-19 NOTE — Patient Instructions (Signed)
Preventive Care for Adults A healthy lifestyle and preventive care can promote health and wellness. Preventive health guidelines for men include the following key practices:  A routine yearly physical is a good way to check with your health care provider about your health and preventative screening. It is a chance to share any concerns and updates on your health and to receive a thorough exam.  Visit your dentist for a routine exam and preventative care every 6 months. Brush your teeth twice a day and floss once a day. Good oral hygiene prevents tooth decay and gum disease.  The frequency of eye exams is based on your age, health, family medical history, use of contact lenses, and other factors. Follow your health care provider's recommendations for frequency of eye exams.  Eat a healthy diet. Foods such as vegetables, fruits, whole grains, low-fat dairy products, and lean protein foods contain the nutrients you need without too many calories. Decrease your intake of foods high in solid fats, added sugars, and salt. Eat the right amount of calories for you.Get information about a proper diet from your health care provider, if necessary.  Regular physical exercise is one of the most important things you can do for your health. Most adults should get at least 150 minutes of moderate-intensity exercise (any activity that increases your heart rate and causes you to sweat) each week. In addition, most adults need muscle-strengthening exercises on 2 or more days a week.  Maintain a healthy weight. The body mass index (BMI) is a screening tool to identify possible weight problems. It provides an estimate of body fat based on height and weight. Your health care provider can find your BMI and can help you achieve or maintain a healthy weight.For adults 20 years and older:  A BMI below 18.5 is considered underweight.  A BMI of 18.5 to 24.9 is normal.  A BMI of 25 to 29.9 is considered overweight.  A BMI  of 30 and above is considered obese.  Maintain normal blood lipids and cholesterol levels by exercising and minimizing your intake of saturated fat. Eat a balanced diet with plenty of fruit and vegetables. Blood tests for lipids and cholesterol should begin at age 50 and be repeated every 5 years. If your lipid or cholesterol levels are high, you are over 50, or you are at high risk for heart disease, you may need your cholesterol levels checked more frequently.Ongoing high lipid and cholesterol levels should be treated with medicines if diet and exercise are not working.  If you smoke, find out from your health care provider how to quit. If you do not use tobacco, do not start.  Lung cancer screening is recommended for adults aged 73-80 years who are at high risk for developing lung cancer because of a history of smoking. A yearly low-dose CT scan of the lungs is recommended for people who have at least a 30-pack-year history of smoking and are a current smoker or have quit within the past 15 years. A pack year of smoking is smoking an average of 1 pack of cigarettes a day for 1 year (for example: 1 pack a day for 30 years or 2 packs a day for 15 years). Yearly screening should continue until the smoker has stopped smoking for at least 15 years. Yearly screening should be stopped for people who develop a health problem that would prevent them from having lung cancer treatment.  If you choose to drink alcohol, do not have more than  2 drinks per day. One drink is considered to be 12 ounces (355 mL) of beer, 5 ounces (148 mL) of wine, or 1.5 ounces (44 mL) of liquor.  Avoid use of street drugs. Do not share needles with anyone. Ask for help if you need support or instructions about stopping the use of drugs.  High blood pressure causes heart disease and increases the risk of stroke. Your blood pressure should be checked at least every 1-2 years. Ongoing high blood pressure should be treated with  medicines, if weight loss and exercise are not effective.  If you are 45-79 years old, ask your health care provider if you should take aspirin to prevent heart disease.  Diabetes screening involves taking a blood sample to check your fasting blood sugar level. This should be done once every 3 years, after age 45, if you are within normal weight and without risk factors for diabetes. Testing should be considered at a younger age or be carried out more frequently if you are overweight and have at least 1 risk factor for diabetes.  Colorectal cancer can be detected and often prevented. Most routine colorectal cancer screening begins at the age of 50 and continues through age 75. However, your health care provider may recommend screening at an earlier age if you have risk factors for colon cancer. On a yearly basis, your health care provider may provide home test kits to check for hidden blood in the stool. Use of a small camera at the end of a tube to directly examine the colon (sigmoidoscopy or colonoscopy) can detect the earliest forms of colorectal cancer. Talk to your health care provider about this at age 50, when routine screening begins. Direct exam of the colon should be repeated every 5-10 years through age 75, unless early forms of precancerous polyps or small growths are found.  People who are at an increased risk for hepatitis B should be screened for this virus. You are considered at high risk for hepatitis B if:  You were born in a country where hepatitis B occurs often. Talk with your health care provider about which countries are considered high risk.  Your parents were born in a high-risk country and you have not received a shot to protect against hepatitis B (hepatitis B vaccine).  You have HIV or AIDS.  You use needles to inject street drugs.  You live with, or have sex with, someone who has hepatitis B.  You are a man who has sex with other men (MSM).  You get hemodialysis  treatment.  You take certain medicines for conditions such as cancer, organ transplantation, and autoimmune conditions.  Hepatitis C blood testing is recommended for all people born from 1945 through 1965 and any individual with known risks for hepatitis C.  Practice safe sex. Use condoms and avoid high-risk sexual practices to reduce the spread of sexually transmitted infections (STIs). STIs include gonorrhea, chlamydia, syphilis, trichomonas, herpes, HPV, and human immunodeficiency virus (HIV). Herpes, HIV, and HPV are viral illnesses that have no cure. They can result in disability, cancer, and death.  If you are at risk of being infected with HIV, it is recommended that you take a prescription medicine daily to prevent HIV infection. This is called preexposure prophylaxis (PrEP). You are considered at risk if:  You are a man who has sex with other men (MSM) and have other risk factors.  You are a heterosexual man, are sexually active, and are at increased risk for HIV infection.    You take drugs by injection.  You are sexually active with a partner who has HIV.  Talk with your health care provider about whether you are at high risk of being infected with HIV. If you choose to begin PrEP, you should first be tested for HIV. You should then be tested every 3 months for as long as you are taking PrEP.  A one-time screening for abdominal aortic aneurysm (AAA) and surgical repair of large AAAs by ultrasound are recommended for men ages 32 to 67 years who are current or former smokers.  Healthy men should no longer receive prostate-specific antigen (PSA) blood tests as part of routine cancer screening. Talk with your health care provider about prostate cancer screening.  Testicular cancer screening is not recommended for adult males who have no symptoms. Screening includes self-exam, a health care provider exam, and other screening tests. Consult with your health care provider about any symptoms  you have or any concerns you have about testicular cancer.  Use sunscreen. Apply sunscreen liberally and repeatedly throughout the day. You should seek shade when your shadow is shorter than you. Protect yourself by wearing long sleeves, pants, a wide-brimmed hat, and sunglasses year round, whenever you are outdoors.  Once a month, do a whole-body skin exam, using a mirror to look at the skin on your back. Tell your health care provider about new moles, moles that have irregular borders, moles that are larger than a pencil eraser, or moles that have changed in shape or color.  Stay current with required vaccines (immunizations).  Influenza vaccine. All adults should be immunized every year.  Tetanus, diphtheria, and acellular pertussis (Td, Tdap) vaccine. An adult who has not previously received Tdap or who does not know his vaccine status should receive 1 dose of Tdap. This initial dose should be followed by tetanus and diphtheria toxoids (Td) booster doses every 10 years. Adults with an unknown or incomplete history of completing a 3-dose immunization series with Td-containing vaccines should begin or complete a primary immunization series including a Tdap dose. Adults should receive a Td booster every 10 years.  Varicella vaccine. An adult without evidence of immunity to varicella should receive 2 doses or a second dose if he has previously received 1 dose.  Human papillomavirus (HPV) vaccine. Males aged 68-21 years who have not received the vaccine previously should receive the 3-dose series. Males aged 22-26 years may be immunized. Immunization is recommended through the age of 6 years for any male who has sex with males and did not get any or all doses earlier. Immunization is recommended for any person with an immunocompromised condition through the age of 49 years if he did not get any or all doses earlier. During the 3-dose series, the second dose should be obtained 4-8 weeks after the first  dose. The third dose should be obtained 24 weeks after the first dose and 16 weeks after the second dose.  Zoster vaccine. One dose is recommended for adults aged 50 years or older unless certain conditions are present.  Measles, mumps, and rubella (MMR) vaccine. Adults born before [birth year] generally are considered immune to measles and mumps. Adults born in [birth year] or later should have 1 or more doses of MMR vaccine unless there is a contraindication to the vaccine or there is laboratory evidence of immunity to each of the three diseases. A routine second dose of MMR vaccine should be obtained at least 28 days after the first dose for students attending postsecondary  schools, health care workers, or international travelers. People who received inactivated measles vaccine or an unknown type of measles vaccine during 1963-1967 should receive 2 doses of MMR vaccine. People who received inactivated mumps vaccine or an unknown type of mumps vaccine before 1979 and are at high risk for mumps infection should consider immunization with 2 doses of MMR vaccine. Unvaccinated health care workers born before 1957 who lack laboratory evidence of measles, mumps, or rubella immunity or laboratory confirmation of disease should consider measles and mumps immunization with 2 doses of MMR vaccine or rubella immunization with 1 dose of MMR vaccine.  Pneumococcal 13-valent conjugate (PCV13) vaccine. When indicated, a person who is uncertain of his immunization history and has no record of immunization should receive the PCV13 vaccine. An adult aged 19 years or older who has certain medical conditions and has not been previously immunized should receive 1 dose of PCV13 vaccine. This PCV13 should be followed with a dose of pneumococcal polysaccharide (PPSV23) vaccine. The PPSV23 vaccine dose should be obtained at least 8 weeks after the dose of PCV13 vaccine. An adult aged 19 years or older who has certain medical conditions and  previously received 1 or more doses of PPSV23 vaccine should receive 1 dose of PCV13. The PCV13 vaccine dose should be obtained 1 or more years after the last PPSV23 vaccine dose.  Pneumococcal polysaccharide (PPSV23) vaccine. When PCV13 is also indicated, PCV13 should be obtained first. All adults aged 65 years and older should be immunized. An adult younger than age 65 years who has certain medical conditions should be immunized. Any person who resides in a nursing home or long-term care facility should be immunized. An adult smoker should be immunized. People with an immunocompromised condition and certain other conditions should receive both PCV13 and PPSV23 vaccines. People with human immunodeficiency virus (HIV) infection should be immunized as soon as possible after diagnosis. Immunization during chemotherapy or radiation therapy should be avoided. Routine use of PPSV23 vaccine is not recommended for American Indians, [place] Natives, or people younger than 65 years unless there are medical conditions that require PPSV23 vaccine. When indicated, people who have unknown immunization and have no record of immunization should receive PPSV23 vaccine. One-time revaccination 5 years after the first dose of PPSV23 is recommended for people aged 19-64 years who have chronic kidney failure, nephrotic syndrome, asplenia, or immunocompromised conditions. People who received 1-2 doses of PPSV23 before age 65 years should receive another dose of PPSV23 vaccine at age 65 years or later if at least 5 years have passed since the previous dose. Doses of PPSV23 are not needed for people immunized with PPSV23 at or after age 65 years.  Meningococcal vaccine. Adults with asplenia or persistent complement component deficiencies should receive 2 doses of quadrivalent meningococcal conjugate (MenACWY-D) vaccine. The doses should be obtained at least 2 months apart. Microbiologists working with certain meningococcal bacteria,  military recruits, people at risk during an outbreak, and people who travel to or live in countries with a high rate of meningitis should be immunized. A first-year college student up through age 21 years who is living in a residence hall should receive a dose if he did not receive a dose on or after his 16th birthday. Adults who have certain high-risk conditions should receive one or more doses of vaccine.  Hepatitis A vaccine. Adults who wish to be protected from this disease, have certain high-risk conditions, work with hepatitis A-infected animals, work in hepatitis A research labs, or   travel to or work in countries with a high rate of hepatitis A should be immunized. Adults who were previously unvaccinated and who anticipate close contact with an international adoptee during the first 60 days after arrival in the [place] from a country with a high rate of hepatitis A should be immunized.  Hepatitis B vaccine. Adults should be immunized if they wish to be protected from this disease, have certain high-risk conditions, may be exposed to blood or other infectious body fluids, are household contacts or sex partners of hepatitis B positive people, are clients or workers in certain care facilities, or travel to or work in countries with a high rate of hepatitis B.  Haemophilus influenzae type b (Hib) vaccine. A previously unvaccinated person with asplenia or sickle cell disease or having a scheduled splenectomy should receive 1 dose of Hib vaccine. Regardless of previous immunization, a recipient of a hematopoietic stem cell transplant should receive a 3-dose series 6-12 months after his successful transplant. Hib vaccine is not recommended for adults with HIV infection. Preventive Service / Frequency Ages 52 to 17  Blood pressure check.** / Every 1 to 2 years.  Lipid and cholesterol check.** / Every 5 years beginning at age 69.  Hepatitis C blood test.** / For any individual with known risks for  hepatitis C.  Skin self-exam. / Monthly.  Influenza vaccine. / Every year.  Tetanus, diphtheria, and acellular pertussis (Tdap, Td) vaccine.** / Consult your health care provider. 1 dose of Td every 10 years.  Varicella vaccine.** / Consult your health care provider.  HPV vaccine. / 3 doses over 6 months, if 72 or younger.  Measles, mumps, rubella (MMR) vaccine.** / You need at least 1 dose of MMR if you were born in 1957 or later. You may also need a second dose.  Pneumococcal 13-valent conjugate (PCV13) vaccine.** / Consult your health care provider.  Pneumococcal polysaccharide (PPSV23) vaccine.** / 1 to 2 doses if you smoke cigarettes or if you have certain conditions.  Meningococcal vaccine.** / 1 dose if you are age 35 to 60 years and a Market researcher living in a residence hall, or have one of several medical conditions. You may also need additional booster doses.  Hepatitis A vaccine.** / Consult your health care provider.  Hepatitis B vaccine.** / Consult your health care provider.  Haemophilus influenzae type b (Hib) vaccine.** / Consult your health care provider. Ages 35 to 8  Blood pressure check.** / Every 1 to 2 years.  Lipid and cholesterol check.** / Every 5 years beginning at age 57.  Lung cancer screening. / Every year if you are aged 44-80 years and have a 30-pack-year history of smoking and currently smoke or have quit within the past 15 years. Yearly screening is stopped once you have quit smoking for at least 15 years or develop a health problem that would prevent you from having lung cancer treatment.  Fecal occult blood test (FOBT) of stool. / Every year beginning at age 55 and continuing until age 73. You may not have to do this test if you get a colonoscopy every 10 years.  Flexible sigmoidoscopy** or colonoscopy.** / Every 5 years for a flexible sigmoidoscopy or every 10 years for a colonoscopy beginning at age 28 and continuing until age  1.  Hepatitis C blood test.** / For all people born from [birth year] through 1965 and any individual with known risks for hepatitis C.  Skin self-exam. / Monthly.  Influenza vaccine. / Every  year.  Tetanus, diphtheria, and acellular pertussis (Tdap/Td) vaccine.** / Consult your health care provider. 1 dose of Td every 10 years.  Varicella vaccine.** / Consult your health care provider.  Zoster vaccine.** / 1 dose for adults aged 53 years or older.  Measles, mumps, rubella (MMR) vaccine.** / You need at least 1 dose of MMR if you were born in 1957 or later. You may also need a second dose.  Pneumococcal 13-valent conjugate (PCV13) vaccine.** / Consult your health care provider.  Pneumococcal polysaccharide (PPSV23) vaccine.** / 1 to 2 doses if you smoke cigarettes or if you have certain conditions.  Meningococcal vaccine.** / Consult your health care provider.  Hepatitis A vaccine.** / Consult your health care provider.  Hepatitis B vaccine.** / Consult your health care provider.  Haemophilus influenzae type b (Hib) vaccine.** / Consult your health care provider. Ages 77 and over  Blood pressure check.** / Every 1 to 2 years.  Lipid and cholesterol check.**/ Every 5 years beginning at age 85.  Lung cancer screening. / Every year if you are aged 55-80 years and have a 30-pack-year history of smoking and currently smoke or have quit within the past 15 years. Yearly screening is stopped once you have quit smoking for at least 15 years or develop a health problem that would prevent you from having lung cancer treatment.  Fecal occult blood test (FOBT) of stool. / Every year beginning at age 33 and continuing until age 11. You may not have to do this test if you get a colonoscopy every 10 years.  Flexible sigmoidoscopy** or colonoscopy.** / Every 5 years for a flexible sigmoidoscopy or every 10 years for a colonoscopy beginning at age 28 and continuing until age 73.  Hepatitis C blood  test.** / For all people born from [birth year] through 1963 and any individual with known risks for hepatitis C.  Abdominal aortic aneurysm (AAA) screening.** / A one-time screening for ages 50 to 27 years who are current or former smokers.  Skin self-exam. / Monthly.  Influenza vaccine. / Every year.  Tetanus, diphtheria, and acellular pertussis (Tdap/Td) vaccine.** / 1 dose of Td every 10 years.  Varicella vaccine.** / Consult your health care provider.  Zoster vaccine.** / 1 dose for adults aged 34 years or older.  Pneumococcal 13-valent conjugate (PCV13) vaccine.** / Consult your health care provider.  Pneumococcal polysaccharide (PPSV23) vaccine.** / 1 dose for all adults aged 63 years and older.  Meningococcal vaccine.** / Consult your health care provider.  Hepatitis A vaccine.** / Consult your health care provider.  Hepatitis B vaccine.** / Consult your health care provider.  Haemophilus influenzae type b (Hib) vaccine.** / Consult your health care provider. **Family history and personal history of risk and conditions may change your health care provider's recommendations. Document Released: 10/03/2001 Document Revised: 08/12/2013 Document Reviewed: 01/02/2011 [facility] Patient Information 2015 [place], [place]. This information is not intended to replace advice given to you by your health care provider. Make sure you discuss any questions you have with your health care provider.

## 2014-11-19 NOTE — Progress Notes (Signed)
Subjective:   Keith Hill is a pleasant 81 y.o. male who presents for an Initial Medicare Annual Wellness Visit. Keith Hill is a patient of Dr. Redge Gainer and is a retired Land from [place]. He lives with his wife and they have an active lifestyle by playing golf and traveling. He has one son and 2 grandchildren. He reports that he has parkinson's disease and is followed by Dr. Jannifer Franklin. He reports that this is controlled with medication but he still has occasional tremors. He is alert and oriented to person,place, and time.  Review of Systems  Cardiac Risk Factors include: advanced age (>72men, >61 women);hypertension;male gender    Objective:    Today's Vitals   11/19/14 1403  BP: 113/58  Pulse: 78  Resp: 20  Height: 5' 8.5" (1.74 m)  Weight: 172 lb (78.019 kg)  PainSc: 0-No pain    Current Medications (verified) Outpatient Encounter Prescriptions as of 11/19/2014  Medication Sig  . amLODipine (NORVASC) 10 MG tablet TAKE (1/2) TABLET DAILY.  Marland Kitchen atorvastatin (LIPITOR) 40 MG tablet TAKE ONE TABLET AT BEDTIME  . carbidopa-levodopa (SINEMET IR) 25-100 MG per tablet Take 1 tablet by mouth 3 (three) times daily.  . Cholecalciferol (VITAMIN D3) 5000 UNITS CAPS Take 1 capsule by mouth 4 (four) times a week.   . diphenhydrAMINE (BENADRYL) 25 MG tablet Take 25 mg by mouth at bedtime as needed for itching (1/2 tablet).  Marland Kitchen docusate sodium (COLACE) 100 MG capsule Take 100 mg by mouth 2 (two) times daily. daily  . fluticasone (FLONASE) 50 MCG/ACT nasal spray INHALE 1 SPRAYS INTO EACH NOSTRIL ONCE DAILY  . FOLGARD RX 2.2-25-1 MG TABS TAKE 1 TABLET DAILY  . Folic Acid-Vit O7-SJG G83 (FOLGARD RX 2.2 PO) Take 1 tablet by mouth daily.    [name] Oil 300 MG CAPS Take by mouth daily.  . Probiotic Product ([place]) Take by mouth 1 dose over 24 hours.    . rasagiline (AZILECT) 1 MG TABS tablet Take 1 tablet (1 mg total) by mouth daily.  Marland Kitchen terazosin (HYTRIN) 2 MG  capsule TAKE (1) CAPSULE DAILY AT BEDTIME.  . valsartan-hydrochlorothiazide (DIOVAN-HCT) 320-12.5 MG per tablet Take 1/4 tablet daily  . valsartan-hydrochlorothiazide (DIOVAN-HCT) 320-25 MG per tablet TAKE 1 TABLET ONCE A DAY (Patient not taking: Reported on 11/19/2014)    Allergies (verified) Aspirin; Aspirin free childs; Calicum glycerophosphate; Niaspan; Prednisone; and Zocor   History: Past Medical History  Diagnosis Date  . Pancreatitis 1976  . Lumbar disc disease   . Hyperlipidemia   . Hyperplasia of prostate   . Cardiac dysrhythmia, unspecified   . Allergic rhinitis, cause unspecified   . Colon polyps   . Hemorrhoids, external   . Hiatal hernia   . GERD (gastroesophageal reflux disease)   . Hypertension   . Essential and other specified forms of tremor   . Paralysis agitans 11/07/2012  . Prostate cancer   . Parkinson disease     dx 4-5 months ago  . Peyronie's disease    Past Surgical History  Procedure Laterality Date  . Knee surgery    . Shoulder surgery Left   . Hemorrhoid surgery    . Prostate biopsy  04/03/2013  . Prostate biopsy  11/01/2011   Family History  Problem Relation Age of Onset  . Stroke Father   . Kidney failure Father   . Alcoholism Brother   . Heart attack Brother   . Cancer Neg Hx  Social History   Occupational History  . retired     Diplomatic Services operational officer  .     Social History Main Topics  . Smoking status: Never Smoker   . Smokeless tobacco: Never Used  . Alcohol Use: No  . Drug Use: No  . Sexual Activity: Yes   Tobacco Counseling: NA Never a smoker   Activities of Daily Living In your present state of health, do you have any difficulty performing the following activities: 11/19/2014  Is the patient deaf or have difficulty hearing? N  Hearing N  Vision N  Difficulty concentrating or making decisions N  Walking or climbing stairs? N  Doing errands, shopping? N  Preparing Food and eating ? N  Using the Toilet? N  In the past six  months, have you accidently leaked urine? N  Do you have problems with loss of bowel control? N  Managing your Medications? N  Managing your Finances? N  Housekeeping or managing your Housekeeping? N    Immunizations and Health Maintenance Immunization History  Administered Date(s) Administered  . Influenza Whole 04/21/2010  . Influenza,inj,Quad PF,36+ Mos 05/29/2013, 06/01/2014  . Pneumococcal Conjugate-13 08/28/2013  . Pneumococcal Polysaccharide-23 07/21/2006  . Td 10/26/2008  . Tdap 05/25/2011  . Zoster 07/25/2006   Health Maintenance Due  Topic Date Due  . COLONOSCOPY  10/25/2014    Patient Care Team: Chevis Pretty, FNP as PCP - General (Nurse Practitioner) Kathrynn Ducking, MD as Consulting Physician (Neurology) Clarene Essex, MD as Consulting Physician (Gastroenterology) Williams Che, MD as Consulting Physician (Ophthalmology) Jacolyn Reedy, MD as Consulting Physician (Cardiology) Irine Seal, MD as Attending Physician (Urology)     Assessment:   This is a routine wellness examination for Keith Hill.   Hearing/Vision screen No hearing or vision deficits noted. Patient does report he wears reading glasses when he reads. He is followed by Dr. Maye Hides for his eyes.Last eye exam was 01/20/2014. Report noted in epic. Pt reports that he has "slight cataracts" bilaterally that they are watching but not bad enough to correct at this time. Dietary issues and exercise activities discussed: Current Exercise Habits:: Home exercise routine, Type of exercise: treadmill;Other - see comments (Plays golf 3 times a week), Time (Minutes): > 60, Frequency (Times/Week): 4, Weekly Exercise (Minutes/Week): 0, Intensity: Moderate   Depression Screen PHQ 2/9 Scores 11/19/2014 07/30/2014 12/18/2013  PHQ - 2 Score 0 0 0    Fall Risk Fall Risk  11/19/2014 07/30/2014 12/18/2013 03/11/2013  Falls in the past year? Yes No No No  Number falls in past yr: 1 - - -  Injury with Fall? No - -  -    Cognitive Function: MMSE - Mini Mental State Exam 11/19/2014  Orientation to time 5  Orientation to Place 5  Registration 3  Attention/ Calculation 5  Recall 3  Language- name 2 objects 2  Language- repeat 1  Language- follow 3 step command 3  Language- read & follow direction 1  Write a sentence 1  Copy design 1  Total score 30    Screening Tests Health Maintenance  Topic Date Due  . COLONOSCOPY  10/25/2014  . INFLUENZA VACCINE  03/22/2015  . TETANUS/TDAP  05/24/2021  . ZOSTAVAX  Completed  . PNA vac Low Risk Adult  Completed        Plan:  Continue good therapeutic lifestyle changes which includes good diet and exercise. Continue with his active lifestyle of playing golf 3 times a week and walking on the  treadmill.  Fall precautions discussed with patient. Remove tripping hazards such as throw rugs.  During the course of the visit Shinichi was educated and counseled about the following appropriate screening and preventive services:   Vaccines to include Pneumoccal, Influenza,Tdap, Zostavax are all up to date.   Electrocardiogram last completed 03/01/14   Colorectal cancer screening last colonoscopy 10/24/2004. Pt reports that they told him that he did not need any further testing. I will discuss with Dr. Laurance Flatten for his recommendations. FOBT was negative 03/06/2014  Cardiovascular disease screening: followed by Dr. Tollie Eth last visit 04/07/14. Follow up prn  Diabetes screening glucose testing performed at routine office visit and noted as normal  Glaucoma screening: followed by Dr. Arie Sabina last exam 01/20/2014. Patient will call for exam when due in June.  Prostate cancer screening has followup appointment with Dr.Moore 12/08/2014. Patient is followed by Dr. Jeffie Pollock. Last PSA 3.1 on  03/19/14.  Patient Instructions (the written plan) were given to the patient.   Joneen Boers, RN   11/19/2014       I have reviewed and agree with the above AWV  documentation.  Claretta Fraise, M.D.

## 2014-11-24 ENCOUNTER — Other Ambulatory Visit: Payer: Self-pay | Admitting: Family Medicine

## 2014-12-01 ENCOUNTER — Other Ambulatory Visit (INDEPENDENT_AMBULATORY_CARE_PROVIDER_SITE_OTHER): Payer: Medicare Other

## 2014-12-01 DIAGNOSIS — R7989 Other specified abnormal findings of blood chemistry: Secondary | ICD-10-CM

## 2014-12-01 DIAGNOSIS — E559 Vitamin D deficiency, unspecified: Secondary | ICD-10-CM | POA: Diagnosis not present

## 2014-12-01 DIAGNOSIS — D649 Anemia, unspecified: Secondary | ICD-10-CM

## 2014-12-01 DIAGNOSIS — R718 Other abnormality of red blood cells: Secondary | ICD-10-CM

## 2014-12-01 DIAGNOSIS — R799 Abnormal finding of blood chemistry, unspecified: Secondary | ICD-10-CM | POA: Diagnosis not present

## 2014-12-01 DIAGNOSIS — E785 Hyperlipidemia, unspecified: Secondary | ICD-10-CM

## 2014-12-01 DIAGNOSIS — I10 Essential (primary) hypertension: Secondary | ICD-10-CM

## 2014-12-01 LAB — POCT CBC
GRANULOCYTE PERCENT: 75.4 % (ref 37–80)
HCT, POC: 41.6 % — AB (ref 43.5–53.7)
Hemoglobin: 13.4 g/dL — AB (ref 14.1–18.1)
LYMPH, POC: 1.6 (ref 0.6–3.4)
MCH, POC: 32.2 pg — AB (ref 27–31.2)
MCHC: 32.2 g/dL (ref 31.8–35.4)
MCV: 100.2 fL — AB (ref 80–97)
MPV: 7.6 fL (ref 0–99.8)
PLATELET COUNT, POC: 291 10*3/uL (ref 142–424)
POC Granulocyte: 5.9 (ref 2–6.9)
POC LYMPH %: 20.1 % (ref 10–50)
RBC: 4.15 M/uL — AB (ref 4.69–6.13)
RDW, POC: 12.8 %
WBC: 7.8 10*3/uL (ref 4.6–10.2)

## 2014-12-02 LAB — BMP8+EGFR
BUN/Creatinine Ratio: 17 (ref 10–22)
BUN: 21 mg/dL (ref 8–27)
CHLORIDE: 101 mmol/L (ref 97–108)
CO2: 25 mmol/L (ref 18–29)
Calcium: 9.5 mg/dL (ref 8.6–10.2)
Creatinine, Ser: 1.23 mg/dL (ref 0.76–1.27)
GFR calc Af Amer: 63 mL/min/{1.73_m2} (ref >59)
GFR calc non Af Amer: 55 mL/min/{1.73_m2} — ABNORMAL LOW (ref >59)
GLUCOSE: 97 mg/dL (ref 65–99)
Potassium: 4.5 mmol/L (ref 3.5–5.2)
Sodium: 143 mmol/L (ref 134–144)

## 2014-12-02 LAB — VITAMIN D 25 HYDROXY (VIT D DEFICIENCY, FRACTURES): Vit D, 25-Hydroxy: 44.4 ng/mL (ref 30.0–100.0)

## 2014-12-02 LAB — NMR, LIPOPROFILE
CHOLESTEROL: 139 mg/dL (ref 100–199)
HDL Cholesterol by NMR: 57 mg/dL (ref >39)
HDL Particle Number: 32.7 umol/L (ref >=30.5)
LDL PARTICLE NUMBER: 738 nmol/L (ref <1000)
LDL Size: 20.6 nm (ref >20.5)
LDL-C: 67 mg/dL (ref 0–99)
LP-IR Score: 36 (ref <=45)
Small LDL Particle Number: 282 nmol/L (ref <=527)
TRIGLYCERIDES BY NMR: 73 mg/dL (ref 0–149)

## 2014-12-02 LAB — HEPATIC FUNCTION PANEL
ALT: 52 IU/L — AB (ref 0–44)
AST: 39 IU/L (ref 0–40)
Albumin: 4.4 g/dL (ref 3.5–4.7)
Alkaline Phosphatase: 87 IU/L (ref 39–117)
BILIRUBIN TOTAL: 0.6 mg/dL (ref 0.0–1.2)
BILIRUBIN, DIRECT: 0.17 mg/dL (ref 0.00–0.40)
TOTAL PROTEIN: 7.4 g/dL (ref 6.0–8.5)

## 2014-12-02 NOTE — Addendum Note (Signed)
Addended by: Thana Ates on: 12/02/2014 03:55 PM   Modules accepted: Orders

## 2014-12-03 LAB — SPECIMEN STATUS REPORT

## 2014-12-03 LAB — VITAMIN B12: Vitamin B-12: 965 pg/mL — ABNORMAL HIGH (ref 211–946)

## 2014-12-08 ENCOUNTER — Encounter: Payer: Self-pay | Admitting: Family Medicine

## 2014-12-08 ENCOUNTER — Ambulatory Visit (INDEPENDENT_AMBULATORY_CARE_PROVIDER_SITE_OTHER): Payer: Medicare Other | Admitting: Family Medicine

## 2014-12-08 VITALS — BP 108/58 | HR 70 | Temp 96.9°F | Ht 68.5 in | Wt 167.0 lb

## 2014-12-08 DIAGNOSIS — G20 Parkinson's disease: Secondary | ICD-10-CM

## 2014-12-08 DIAGNOSIS — I10 Essential (primary) hypertension: Secondary | ICD-10-CM

## 2014-12-08 DIAGNOSIS — E785 Hyperlipidemia, unspecified: Secondary | ICD-10-CM

## 2014-12-08 DIAGNOSIS — J301 Allergic rhinitis due to pollen: Secondary | ICD-10-CM | POA: Diagnosis not present

## 2014-12-08 DIAGNOSIS — C61 Malignant neoplasm of prostate: Secondary | ICD-10-CM

## 2014-12-08 DIAGNOSIS — E559 Vitamin D deficiency, unspecified: Secondary | ICD-10-CM | POA: Diagnosis not present

## 2014-12-08 NOTE — Progress Notes (Signed)
Subjective:    Patient ID: Keith Hill, male    DOB: 1932-05-06, 81 y.o.   MRN: [id number]  HPI Pt here for follow up and management of chronic medical problems which includes hypertension and hyperlipidemia. He is taking medications regularly. The patient sees the cardiologist, Dr. Wynonia Lawman regularly. He also sees the neurologist Dr. Jannifer Franklin for his Parkinson's disease. And he is staying active with routine exercise and golf and is doing great with this. He does complain of some nasal congestion and drainage today. He has had recent lab work and this will be reviewed with him today. His renal function his cholesterol and his vitamin D levels were excellent. We also checked a B12 level and this was good 2. His rectal exam was done recently by the urologist because of his history of prostate cancer. The patient denies chest pain shortness of breath or trouble passing his water. He has no GI symptoms that are concerning. He sees the urologist every 6 months, the neurologist every 6 months, and the cardiologist yearly.      Patient Active Problem List   Diagnosis Date Noted  . Back pain 11/18/2013  . Vitamin D deficiency 08/28/2013  . Hiatal hernia with gastroesophageal reflux 08/28/2013  . CAD (coronary artery disease) 04/08/2013  . Essential and other specified forms of tremor 03/31/2013  . Parkinson's 11/07/2012  . Hemorrhoids 02/17/2011  . Hyperlipidemia   . Prostate cancer   . Hypertension   . Allergic rhinitis   . Colon polyps, history of    Outpatient Encounter Prescriptions as of 12/08/2014  Medication Sig  . amLODipine (NORVASC) 10 MG tablet TAKE (1/2) TABLET DAILY.  Marland Kitchen atorvastatin (LIPITOR) 40 MG tablet TAKE ONE TABLET AT BEDTIME  . carbidopa-levodopa (SINEMET IR) 25-100 MG per tablet Take 1 tablet by mouth 3 (three) times daily.  . Cholecalciferol (VITAMIN D3) 5000 UNITS CAPS Take 1 capsule by mouth 4 (four) times a week.   . diphenhydrAMINE (BENADRYL) 25 MG tablet Take 25 mg  by mouth at bedtime as needed for itching (1/2 tablet).  Marland Kitchen docusate sodium (COLACE) 100 MG capsule Take 100 mg by mouth 2 (two) times daily. daily  . fluticasone (FLONASE) 50 MCG/ACT nasal spray INHALE 1 SPRAYS INTO EACH NOSTRIL ONCE DAILY  . FOLGARD RX 2.2-25-1 MG TABS TAKE 1 TABLET DAILY  . Krill Oil 300 MG CAPS Take by mouth daily.  . Probiotic Product ([place]) Take by mouth 1 dose over 24 hours.    . rasagiline (AZILECT) 1 MG TABS tablet Take 1 tablet (1 mg total) by mouth daily.  Marland Kitchen terazosin (HYTRIN) 2 MG capsule TAKE (1) CAPSULE DAILY AT BEDTIME.  . valsartan-hydrochlorothiazide (DIOVAN-HCT) 320-25 MG per tablet TAKE 1 TABLET ONCE A DAY  . [DISCONTINUED] Folic Acid-Vit [id number] D74 (FOLGARD RX 2.2 PO) Take 1 tablet by mouth daily.    . [DISCONTINUED] valsartan-hydrochlorothiazide (DIOVAN-HCT) 320-12.5 MG per tablet Take 1/4 tablet daily    Review of Systems  Constitutional: Negative.   HENT: Positive for postnasal drip and sinus pressure.   Eyes: Negative.   Respiratory: Negative.   Cardiovascular: Negative.   Gastrointestinal: Negative.   Endocrine: Negative.   Genitourinary: Negative.   Musculoskeletal: Negative.   Skin: Negative.   Allergic/Immunologic: Negative.   Neurological: Negative.   Hematological: Negative.   Psychiatric/Behavioral: Negative.        Objective:   Physical Exam  Constitutional: He is oriented to person, place, and time. He appears well-developed and well-nourished. No  distress.  HENT:  Head: Normocephalic and atraumatic.  Right Ear: External ear normal.  Left Ear: External ear normal.  Mouth/Throat: Oropharynx is clear and moist. No oropharyngeal exudate.  Minimal nasal congestion  Eyes: Conjunctivae and EOM are normal. Pupils are equal, round, and reactive to light. Right eye exhibits no discharge. Left eye exhibits no discharge. No scleral icterus.  Neck: Normal range of motion. Neck supple. No thyromegaly present.  No bruits  or anterior cervical adenopathy  Cardiovascular: Normal rate, regular rhythm, normal heart sounds and intact distal pulses.  Exam reveals no gallop and no friction rub.   No murmur heard. At 72/m  Pulmonary/Chest: Effort normal and breath sounds normal. No respiratory distress. He has no wheezes. He has no rales. He exhibits no tenderness.  Clear anteriorly and posteriorly and no axillary adenopathy  Abdominal: Soft. Bowel sounds are normal. He exhibits no mass. There is no tenderness. There is no rebound and no guarding.  No abdominal tenderness bruits or inguinal adenopathy  Genitourinary:  This exam was done in February  by his urologist  Musculoskeletal: Normal range of motion. He exhibits no edema or tenderness.  Lymphadenopathy:    He has no cervical adenopathy.  Neurological: He is alert and oriented to person, place, and time. He has normal reflexes. No cranial nerve deficit.  Only a slight tremor was noticed in the right hand.  Skin: Skin is warm and dry. No rash noted. No erythema. No pallor.  Psychiatric: He has a normal mood and affect. His behavior is normal. Judgment and thought content normal.  Nursing note and vitals reviewed.  BP 108/58 mmHg  Pulse 70  Temp(Src) 96.9 F (36.1 C) (Oral)  Ht 5' 8.5" (1.74 m)  Wt 167 lb (75.751 kg)  BMI 25.02 kg/m2        Assessment & Plan:  1. Hypertension -The blood pressure is under good control and the patient should continue with his current treatment  2. Vitamin D deficiency -The vitamin D level was good and the patient should continue with his current treatment  3. Prostate cancer -The patient will continue to follow-up with the urologist regarding his prostate cancer and we'll see him again in about 10 months.  4. Parkinson's -He is doing extremely well with his Parkinson's staying active physically and will follow-up with the neurologist every 6 months.  5. Hyperlipemia -Cholesterol numbers were excellent and  patient will continue with his atorvastatin and physical activities  6. Allergic rhinitis due to pollen -He will continue with fluticasone nasal spray, half of a Benadryl at bedtime and nasal saline. -He may consider trying a half of a Claritin and taking it earlier in the day to make sure that this doesn't keep him awake at nighttime.  Patient Instructions                       Medicare Annual Wellness Visit  [facility] and the medical providers at [facility] strive to bring you the best medical care.  In doing so we not only want to address your current medical conditions and concerns but also to detect new conditions early and prevent illness, disease and health-related problems.    Medicare offers a yearly Wellness Visit which allows our clinical staff to assess your need for preventative services including immunizations, lifestyle education, counseling to decrease risk of preventable diseases and screening for fall risk and other medical concerns.    This visit is provided free  of charge (no copay) for all Medicare recipients. The clinical pharmacists at [facility] have begun to conduct these Wellness Visits which will also include a thorough review of all your medications.    As you primary medical provider recommend that you make an appointment for your Annual Wellness Visit if you have not done so already this year.  You may set up this appointment before you leave today or you may call back ([phone]) and schedule an appointment.  Please make sure when you call that you mention that you are scheduling your Annual Wellness Visit with the clinical pharmacist so that the appointment may be made for the proper length of time.     Continue current medications. Continue good therapeutic lifestyle changes which include good diet and exercise. Fall precautions discussed with patient. If an FOBT was given today- please return it to our front  desk. If you are over 8 years old - you may need Prevnar 31 or the adult Pneumonia vaccine.  Flu Shots are still available at our office. If you still haven't had one please call to set up a nurse visit to get one.   After your visit with [place] today you will receive a survey in the mail or online from [employer] regarding your care with [place]. Please take a moment to fill this out. Your feedback is very important to [place] as you can help [place] better understand your patient needs as well as improve your experience and satisfaction. WE CARE ABOUT YOU!!!   The patient should continue with his current activity level with Mikey Bussing and exercise therapy. He should keep follow-up appointments with urology cardiology and neurology He should continue with his fluticasone nasal spray and Benadryl as he is doing and continue using nasal saline during the day. If he needs additional medication for allergy symptoms he could try a half of a Claritin once daily if it does not make him too drowsy. Or if it keeps him awake he cannot take this.   Arrie Senate MD

## 2014-12-08 NOTE — Patient Instructions (Addendum)
Medicare Annual Wellness Visit  [facility] and the medical providers at [facility] strive to bring you the best medical care.  In doing so we not only want to address your current medical conditions and concerns but also to detect new conditions early and prevent illness, disease and health-related problems.    Medicare offers a yearly Wellness Visit which allows our clinical staff to assess your need for preventative services including immunizations, lifestyle education, counseling to decrease risk of preventable diseases and screening for fall risk and other medical concerns.    This visit is provided free of charge (no copay) for all Medicare recipients. The clinical pharmacists at [facility] have begun to conduct these Wellness Visits which will also include a thorough review of all your medications.    As you primary medical provider recommend that you make an appointment for your Annual Wellness Visit if you have not done so already this year.  You may set up this appointment before you leave today or you may call back ([phone]) and schedule an appointment.  Please make sure when you call that you mention that you are scheduling your Annual Wellness Visit with the clinical pharmacist so that the appointment may be made for the proper length of time.     Continue current medications. Continue good therapeutic lifestyle changes which include good diet and exercise. Fall precautions discussed with patient. If an FOBT was given today- please return it to our front desk. If you are over 75 years old - you may need Prevnar 26 or the adult Pneumonia vaccine.  Flu Shots are still available at our office. If you still haven't had one please call to set up a nurse visit to get one.   After your visit with [place] today you will receive a survey in the mail or online from [employer] regarding your care with [place]. Please take a moment to  fill this out. Your feedback is very important to [place] as you can help [place] better understand your patient needs as well as improve your experience and satisfaction. WE CARE ABOUT YOU!!!   The patient should continue with his current activity level with Mikey Bussing and exercise therapy. He should keep follow-up appointments with urology cardiology and neurology He should continue with his fluticasone nasal spray and Benadryl as he is doing and continue using nasal saline during the day. If he needs additional medication for allergy symptoms he could try a half of a Claritin once daily if it does not make him too drowsy. Or if it keeps him awake he cannot take this.

## 2014-12-29 ENCOUNTER — Other Ambulatory Visit: Payer: Self-pay | Admitting: Family Medicine

## 2014-12-31 DIAGNOSIS — M25341 Other instability, right hand: Secondary | ICD-10-CM | POA: Diagnosis not present

## 2015-01-01 DIAGNOSIS — D123 Benign neoplasm of transverse colon: Secondary | ICD-10-CM | POA: Diagnosis not present

## 2015-01-01 DIAGNOSIS — K6389 Other specified diseases of intestine: Secondary | ICD-10-CM | POA: Diagnosis not present

## 2015-01-01 DIAGNOSIS — D126 Benign neoplasm of colon, unspecified: Secondary | ICD-10-CM | POA: Diagnosis not present

## 2015-01-01 DIAGNOSIS — K5730 Diverticulosis of large intestine without perforation or abscess without bleeding: Secondary | ICD-10-CM | POA: Diagnosis not present

## 2015-01-01 DIAGNOSIS — Z1211 Encounter for screening for malignant neoplasm of colon: Secondary | ICD-10-CM | POA: Diagnosis not present

## 2015-02-15 ENCOUNTER — Other Ambulatory Visit: Payer: Self-pay | Admitting: Family Medicine

## 2015-02-25 DIAGNOSIS — H3531 Nonexudative age-related macular degeneration: Secondary | ICD-10-CM | POA: Diagnosis not present

## 2015-02-25 DIAGNOSIS — H2513 Age-related nuclear cataract, bilateral: Secondary | ICD-10-CM | POA: Diagnosis not present

## 2015-03-23 ENCOUNTER — Encounter: Payer: Self-pay | Admitting: Neurology

## 2015-03-23 ENCOUNTER — Ambulatory Visit (INDEPENDENT_AMBULATORY_CARE_PROVIDER_SITE_OTHER): Payer: Medicare Other | Admitting: Neurology

## 2015-03-23 VITALS — BP 125/64 | HR 72 | Ht 68.0 in | Wt 166.6 lb

## 2015-03-23 DIAGNOSIS — G20 Parkinson's disease: Secondary | ICD-10-CM

## 2015-03-23 NOTE — Patient Instructions (Signed)
Parkinson Disease Parkinson disease is a disorder of the central nervous system, which includes the brain and spinal cord. A person with this disease slowly loses the ability to completely control body movements. Within the brain, there is a group of nerve cells (basal ganglia) that help control movement. The basal ganglia are damaged and do not work properly in a person with Parkinson disease. In addition, the basal ganglia produce and use a brain chemical called dopamine. The dopamine chemical sends messages to other parts of the body to control and coordinate body movements. Dopamine levels are low in a person with Parkinson disease. If the dopamine levels are low, then the body does not receive the correct messages it needs to move normally.  CAUSES  The exact reason why the basal ganglia get damaged is not known. Some medical researchers have thought that infection, genes, environment, and certain medicines may contribute to the cause.  SYMPTOMS   An early symptom of Parkinson disease is often an uncontrolled shaking (tremor) of the hands. The tremor will often disappear when the affected hand is consciously used.  As the disease progresses, walking, talking, getting out of a chair, and new movements become more difficult.  Muscles get stiff and movements become slower.  Balance and coordination become harder.  Depression, trouble swallowing, urinary problems, constipation, and sleep problems can occur.  Later in the disease, memory and thought processes may deteriorate. DIAGNOSIS  There are no specific tests to diagnose Parkinson disease. You may be referred to a neurologist for evaluation. Your caregiver will ask about your medical history, symptoms, and perform a physical exam. Blood tests and imaging tests of your brain may be performed to rule out other diseases. The imaging tests may include an MRI or a CT scan. TREATMENT  The goal of treatment is to relieve symptoms. Medicines may be  prescribed once the symptoms become troublesome. Medicine will not stop the progression of the disease, but medicine can make movement and balance better and help control tremors. Speech and occupational therapy may also be prescribed. Sometimes, surgical treatment of the brain can be done in young people. HOME CARE INSTRUCTIONS  Get regular exercise and rest periods during the day to help prevent exhaustion and depression.  If getting dressed becomes difficult, replace buttons and zippers with Velcro and elastic on your clothing.  Take all medicine as directed by your caregiver.  Install grab bars or railings in your home to prevent falls.  Go to speech or occupational therapy as directed.  Keep all follow-up visits as directed by your caregiver. SEEK MEDICAL CARE IF:  Your symptoms are not controlled with your medicine.  You fall.  You have trouble swallowing or choke on your food. MAKE SURE YOU:  Understand these instructions.  Will watch your condition.  Will get help right away if you are not doing well or get worse. Document Released: 08/04/2000 Document Revised: 12/02/2012 Document Reviewed: 09/06/2011 ExitCare Patient Information 2015 [employer]. This information is not intended to replace advice given to you by your health care provider. Make sure you discuss any questions you have with your health care provider.  

## 2015-03-23 NOTE — Progress Notes (Signed)
Reason for visit: Parkinson's disease  Keith Hill is an 81 y.o. male  History of present illness:  Keith Hill is an 81 year old right-handed white male with a history of Parkinson's disease primarily with right-sided features. He has remained quite active, he exercises at least 3 times a week, and he plays golf on a regular basis. He primarily has noted a right-sided resting tremor that is worse when he is nervous or upset about something. He denies any balance issues, he occasionally will have some low back pain when stooping. The patient sees a chiropractor on a regular basis. He has had x-rays of the neck and low back and has told that he has degenerative arthritis of the spine. He is on low-dose Sinemet taking 25/100 mg tablets 3 times daily, and he takes Azilect 1 mg daily. He is tolerating medications fairly well. He returns to this office for an evaluation. The patient denies any falls since last seen. He has not had any problems with speech or swallowing.  Past Medical History  Diagnosis Date  . Pancreatitis 1976  . Lumbar disc disease   . Hyperlipidemia   . Hyperplasia of prostate   . Cardiac dysrhythmia, unspecified   . Allergic rhinitis, cause unspecified   . Colon polyps   . Hemorrhoids, external   . Hiatal hernia   . GERD (gastroesophageal reflux disease)   . Hypertension   . Essential and other specified forms of tremor   . Paralysis agitans 11/07/2012  . Prostate cancer   . Parkinson disease     dx 4-5 months ago  . Peyronie's disease     Past Surgical History  Procedure Laterality Date  . Knee surgery    . Shoulder surgery Left   . Hemorrhoid surgery    . Prostate biopsy  04/03/2013  . Prostate biopsy  11/01/2011    Family History  Problem Relation Age of Onset  . Stroke Father   . Kidney failure Father   . Alcoholism Brother   . Heart attack Brother   . Cancer Neg Hx     Social history:  reports that he has never smoked. He has never used smokeless  tobacco. He reports that he does not drink alcohol or use illicit drugs.    Allergies  Allergen Reactions  . Aspirin     Upsets stomach  . Aspirin Free Childs [Acetaminophen]   . Calicum Glycerophosphate   . Niaspan [Niacin Er]   . Prednisone   . Zocor [Simvastatin]     Medications:  Prior to Admission medications   Medication Sig Start Date End Date Taking? Authorizing Provider  amLODipine (NORVASC) 10 MG tablet TAKE (1/2) TABLET DAILY. 02/15/15  Yes Chipper Herb, MD  atorvastatin (LIPITOR) 40 MG tablet TAKE ONE TABLET AT BEDTIME 11/25/14  Yes Chipper Herb, MD  carbidopa-levodopa (SINEMET IR) 25-100 MG per tablet Take 1 tablet by mouth 3 (three) times daily. 09/15/14  Yes Kathrynn Ducking, MD  Cholecalciferol (VITAMIN D3) 5000 UNITS CAPS Take 1 capsule by mouth 4 (four) times a week.    Yes Historical Provider, MD  diphenhydrAMINE (BENADRYL) 25 MG tablet Take 25 mg by mouth at bedtime as needed for itching (1/2 tablet).   Yes Historical Provider, MD  docusate sodium (COLACE) 100 MG capsule Take 100 mg by mouth 2 (two) times daily. daily   Yes Historical Provider, MD  fluticasone (FLONASE) 50 MCG/ACT nasal spray INHALE 1 SPRAYS INTO EACH NOSTRIL ONCE DAILY  Yes Chipper Herb, MD  FOLGARD RX 2.2-25-1 MG TABS TAKE 1 TABLET DAILY 02/15/15  Yes Chipper Herb, MD  Krill Oil 300 MG CAPS Take by mouth daily.   Yes Historical Provider, MD  Probiotic Product ([place]) Take by mouth 1 dose over 24 hours.     Yes Historical Provider, MD  rasagiline (AZILECT) 1 MG TABS tablet Take 1 tablet (1 mg total) by mouth daily. 09/15/14  Yes Kathrynn Ducking, MD  terazosin (HYTRIN) 2 MG capsule TAKE (1) CAPSULE DAILY AT BEDTIME. 12/30/14  Yes Chipper Herb, MD  valsartan-hydrochlorothiazide (DIOVAN-HCT) 320-25 MG per tablet TAKE 1 TABLET ONCE A DAY 10/20/14  Yes Chipper Herb, MD    ROS:  Out of a complete 14 system review of symptoms, the patient complains only of the following  symptoms, and all other reviewed systems are negative.  Runny nose Eye discharge Joint pain, back pain, muscle cramps Tremors  Blood pressure 125/64, pulse 72, height 5\' 8"  (1.727 m), weight 166 lb 9.6 oz (75.569 kg).  Physical Exam  General: The patient is alert and cooperative at the time of the examination.  Skin: No significant peripheral edema is noted.   Neurologic Exam  Mental status: The patient is alert and oriented x 3 at the time of the examination. The patient has apparent normal recent and remote memory, with an apparently normal attention span and concentration ability.   Cranial nerves: Facial symmetry is present. Speech is normal, no aphasia or dysarthria is noted. Extraocular movements are full. Visual fields are full. Mild masking of the face is seen.  Motor: The patient has good strength in all 4 extremities.  Sensory examination: Soft touch sensation is symmetric on the face, arms, and legs.  Coordination: The patient has good finger-nose-finger and heel-to-shin bilaterally. A resting tremor is noted with the right upper extremity.  Gait and station: The patient is able to arise from a seated position with arms crossed. Once up, patient and place with good stride, turns. A tremor is noted with the right arm with walking. Tandem gait is minimally unsteady. Romberg is negative. No drift is seen.  Reflexes: Deep tendon reflexes are symmetric.   Assessment/Plan:  1. Parkinson's disease  The patient is doing relatively well with his Parkinson's disease, we will continue the above medications as is for now, he will follow-up in 6 months. He is to continue to remain active.  Jill Alexanders MD 03/23/2015 7:29 PM  [facility] [address] [place], [place] [address]  Phone [phone] Fax [phone]

## 2015-03-30 ENCOUNTER — Other Ambulatory Visit: Payer: Medicare Other

## 2015-04-06 ENCOUNTER — Other Ambulatory Visit (INDEPENDENT_AMBULATORY_CARE_PROVIDER_SITE_OTHER): Payer: Medicare Other

## 2015-04-06 DIAGNOSIS — G20 Parkinson's disease: Secondary | ICD-10-CM | POA: Diagnosis not present

## 2015-04-06 DIAGNOSIS — I10 Essential (primary) hypertension: Secondary | ICD-10-CM | POA: Diagnosis not present

## 2015-04-06 DIAGNOSIS — E785 Hyperlipidemia, unspecified: Secondary | ICD-10-CM | POA: Diagnosis not present

## 2015-04-06 DIAGNOSIS — E559 Vitamin D deficiency, unspecified: Secondary | ICD-10-CM

## 2015-04-06 DIAGNOSIS — C61 Malignant neoplasm of prostate: Secondary | ICD-10-CM

## 2015-04-06 NOTE — Progress Notes (Signed)
Lab work for Dr Laurance Flatten Lab work for Dr Jeffie Pollock (PSA) dx : Keith Hill

## 2015-04-06 NOTE — Addendum Note (Signed)
Addended by: Selmer Dominion on: 04/06/2015 10:10 AM   Modules accepted: Orders

## 2015-04-06 NOTE — Addendum Note (Signed)
Addended by: Earlene Plater on: 04/06/2015 09:05 AM   Modules accepted: Orders

## 2015-04-06 NOTE — Progress Notes (Signed)
Lab only 

## 2015-04-07 LAB — CBC WITH DIFFERENTIAL/PLATELET
BASOS ABS: 0.1 10*3/uL (ref 0.0–0.2)
Basos: 1 %
EOS (ABSOLUTE): 0.6 10*3/uL — ABNORMAL HIGH (ref 0.0–0.4)
Eos: 7 %
HEMATOCRIT: 37.6 % (ref 37.5–51.0)
Hemoglobin: 12.4 g/dL — ABNORMAL LOW (ref 12.6–17.7)
Immature Grans (Abs): 0 10*3/uL (ref 0.0–0.1)
Immature Granulocytes: 0 %
LYMPHS ABS: 1.6 10*3/uL (ref 0.7–3.1)
Lymphs: 20 %
MCH: 32.5 pg (ref 26.6–33.0)
MCHC: 33 g/dL (ref 31.5–35.7)
MCV: 99 fL — ABNORMAL HIGH (ref 79–97)
Monocytes Absolute: 0.9 10*3/uL (ref 0.1–0.9)
Monocytes: 12 %
Neutrophils Absolute: 4.6 10*3/uL (ref 1.4–7.0)
Neutrophils: 60 %
Platelets: 273 10*3/uL (ref 150–379)
RBC: 3.81 x10E6/uL — AB (ref 4.14–5.80)
RDW: 13.2 % (ref 12.3–15.4)
WBC: 7.6 10*3/uL (ref 3.4–10.8)

## 2015-04-07 LAB — BMP8+EGFR
BUN/Creatinine Ratio: 18 (ref 10–22)
BUN: 24 mg/dL (ref 8–27)
CHLORIDE: 102 mmol/L (ref 97–108)
CO2: 23 mmol/L (ref 18–29)
Calcium: 9.1 mg/dL (ref 8.6–10.2)
Creatinine, Ser: 1.33 mg/dL — ABNORMAL HIGH (ref 0.76–1.27)
GFR calc Af Amer: 58 mL/min/{1.73_m2} — ABNORMAL LOW (ref >59)
GFR calc non Af Amer: 50 mL/min/{1.73_m2} — ABNORMAL LOW (ref >59)
GLUCOSE: 96 mg/dL (ref 65–99)
Potassium: 3.6 mmol/L (ref 3.5–5.2)
Sodium: 144 mmol/L (ref 134–144)

## 2015-04-07 LAB — LIPID PANEL
Chol/HDL Ratio: 2.4 ratio units (ref 0.0–5.0)
Cholesterol, Total: 128 mg/dL (ref 100–199)
HDL: 53 mg/dL (ref >39)
LDL CALC: 63 mg/dL (ref 0–99)
Triglycerides: 60 mg/dL (ref 0–149)
VLDL CHOLESTEROL CAL: 12 mg/dL (ref 5–40)

## 2015-04-07 LAB — PSA, TOTAL AND FREE
PSA FREE PCT: 21.5 %
PSA FREE: 0.73 ng/mL
Prostate Specific Ag, Serum: 3.4 ng/mL (ref 0.0–4.0)

## 2015-04-07 LAB — HEPATIC FUNCTION PANEL
ALT: 42 IU/L (ref 0–44)
AST: 29 IU/L (ref 0–40)
Albumin: 4.3 g/dL (ref 3.5–4.7)
Alkaline Phosphatase: 76 IU/L (ref 39–117)
BILIRUBIN TOTAL: 0.6 mg/dL (ref 0.0–1.2)
BILIRUBIN, DIRECT: 0.18 mg/dL (ref 0.00–0.40)
TOTAL PROTEIN: 7 g/dL (ref 6.0–8.5)

## 2015-04-07 LAB — VITAMIN D 25 HYDROXY (VIT D DEFICIENCY, FRACTURES): VIT D 25 HYDROXY: 43.6 ng/mL (ref 30.0–100.0)

## 2015-04-12 ENCOUNTER — Telehealth: Payer: Self-pay | Admitting: Family Medicine

## 2015-04-12 NOTE — Telephone Encounter (Signed)
Lab work was routing to Dr. Wynonia Lawman and patient aware.

## 2015-04-13 ENCOUNTER — Ambulatory Visit (INDEPENDENT_AMBULATORY_CARE_PROVIDER_SITE_OTHER): Payer: Medicare Other

## 2015-04-13 ENCOUNTER — Encounter: Payer: Self-pay | Admitting: Family Medicine

## 2015-04-13 ENCOUNTER — Ambulatory Visit (INDEPENDENT_AMBULATORY_CARE_PROVIDER_SITE_OTHER): Payer: Medicare Other | Admitting: Family Medicine

## 2015-04-13 ENCOUNTER — Encounter: Payer: Self-pay | Admitting: Cardiology

## 2015-04-13 VITALS — BP 108/56 | HR 80 | Temp 97.3°F | Ht 68.0 in | Wt 168.0 lb

## 2015-04-13 DIAGNOSIS — M5442 Lumbago with sciatica, left side: Secondary | ICD-10-CM

## 2015-04-13 DIAGNOSIS — J301 Allergic rhinitis due to pollen: Secondary | ICD-10-CM

## 2015-04-13 DIAGNOSIS — C61 Malignant neoplasm of prostate: Secondary | ICD-10-CM

## 2015-04-13 DIAGNOSIS — I2510 Atherosclerotic heart disease of native coronary artery without angina pectoris: Secondary | ICD-10-CM | POA: Diagnosis not present

## 2015-04-13 DIAGNOSIS — G20 Parkinson's disease: Secondary | ICD-10-CM | POA: Diagnosis not present

## 2015-04-13 DIAGNOSIS — E559 Vitamin D deficiency, unspecified: Secondary | ICD-10-CM | POA: Diagnosis not present

## 2015-04-13 DIAGNOSIS — E785 Hyperlipidemia, unspecified: Secondary | ICD-10-CM | POA: Diagnosis not present

## 2015-04-13 DIAGNOSIS — I10 Essential (primary) hypertension: Secondary | ICD-10-CM | POA: Diagnosis not present

## 2015-04-13 MED ORDER — FLUTICASONE PROPIONATE 50 MCG/ACT NA SUSP: NASAL | Status: DC

## 2015-04-13 NOTE — Progress Notes (Signed)
Patient ID: Keith Hill, male   DOB: 07-Oct-1931, 81 y.o.   MRN: [id number]   Keith Hill, Keith Hill  Date of visit:  04/13/2015 DOB:  [birth year]    Age:  81 yrs. Medical record number:  [id number]     Account number:  [id number] Primary Care Provider: Redge Gainer ____________________________ CURRENT DIAGNOSES  1. CAD Native without angina  2. Essential (primary) hypertension  3. Hyperlipidemia  4. Parkinson's disease ____________________________ ALLERGIES  Aspirin, Nausea ____________________________ MEDICATIONS  1. terazosin 2 mg Capsule, QHS  2. Stool Softener 100 mg Capsule, 1 p.o. daily  3. Hardin Negus' Colon Health 1.5 billion cell Capsule, 1 p.o. daily  4. Diovan HCT 320-25 mg tablet, 1/4 tab qd  5. Folgard RX 2.2-25-1 mg tablet, 1 p.o. q.d.  6. atorvastatin 40 mg tablet, 1/2 tab 4 x week 1/4 tab 3 x week  7. Benadryl 25 mg capsule, 1/2 tab daily  8. Flonase 50 mcg/actuation spray,suspension, qd  9. krill oil 300-90-24-50 mg capsule, 1 p.o. daily  10. amlodipine 5 mg tablet, 1/2 tab daily  11. Azilect 1 mg tablet, 1 p.o. daily  12. carbidopa 25 mg-levodopa 100 mg tablet, TID  13. Vitamin D3 5,000 unit tablet, 4 x week ____________________________ CHIEF COMPLAINTS  Followup of Atherosclerotic heart disease of native coronary artery without angina pectoris ____________________________ HISTORY OF PRESENT ILLNESS Patient seen for cardiac followup. He has been doing well since he was previously here. He is exercising on a regular basis and his lipids were reviewed today and show good control. He denies angina and has no PND, orthopnea, syncope, palpitations, or claudication. His nature patient has some low back pain and some early stage prostate cancer but is being treated for observation. He really has had a good year with no cardiovascular symptoms. Does have some mild chronic back pain. ____________________________ PAST HISTORY  Past Medical Illnesses:  hypertension, hyperlipidemia, history  of pancreatitis, GERD, BPH, osteoarthritis, Parkinson's disease, allergic rhiniits, lumbar disc disease;  Cardiovascular Illnesses:  CAD;  Surgical Procedures:  knee surgery, left, shoulder surgery-left 2009, hemmoroidectomy;  NYHA Classification:  I;  Canadian Angina Classification:  Class 0: Asymptomatic;  Cardiology Procedures-Invasive:  cardiac cath (left) July 2002;  Cardiology Procedures-Noninvasive:  treadmill cardiolite July 2012, event monitor July 2012;  Cardiac Cath Results:  30% stenosis Left main, 60% stenosis ostial Diag 1, 60% stenosis mid LAD, 60% stenosis CFX, 50% stenosis PDA;  Peripheral Vascular Procedures:  abdominal U/S February 2007;  LVEF of 67% documented via nuclear study on 03/02/2011,   ____________________________ CARDIO-PULMONARY TEST DATES EKG Date:  04/13/2015;   Cardiac Cath Date:  03/18/2001;  Holter/Event Monitor Date: 03/03/2011;  Nuclear Study Date:  03/02/2011;  Chest Xray Date: 08/28/2013;   ____________________________ FAMILY HISTORY Brother -- Brother dead, Heart Attack Brother -- Brother dead, Alcoholism Father -- Father dead, Renal failure syndrome Mother -- Mother dead, Death of unknown cause Sister -- Sister dead, Death of unknown cause Sister -- Sister dead, Death of unknown cause ____________________________ SOCIAL HISTORY Alcohol Use:  no alcohol use;  Smoking:  never smoked;  Diet:  fat modified diet;  Lifestyle:  married;  Exercise:  exercises regularly;  Occupation:  retired and [employer];  Residence:  lives with wife;   ____________________________ REVIEW OF SYSTEMS General:  denies recent weight change, fatique or change in exercise tolerance. Respiratory: denies dyspnea, cough, wheezing or hemoptysis. Cardiovascular:  please review HPI Abdominal: denies dyspepsia, GI bleeding, constipation, or diarrhea Genitourinary-Male: nocturia  Musculoskeletal:  chronic low back pain  Neurological:  tremor  ____________________________ PHYSICAL  EXAMINATION VITAL SIGNS  Blood Pressure:  124/70 Sitting, Right arm, regular cuff  , 118/68 Standing, Right arm and regular cuff   Pulse:  88/min. Weight:  167.00 lbs. Height:  69"BMI: 24  Constitutional:  pleasant white male in no acute distress Skin:  warm and dry to touch, no apparent skin lesions, or masses noted. Head:  normocephalic, normal hair pattern, no masses or tenderness Neck:  supple, no masses, thyromegaly, JVD. Carotid pulses are full and equal bilaterally without bruits. Chest:  normal symmetry, clear to auscultation. Cardiac:  regular rhythm, normal S1 and S2, No S3 or S4, no murmurs, gallops or rubs detected. Peripheral Pulses:  the femoral,dorsalis pedis, and posterior tibial pulses are full and equal bilaterally with no bruits auscultated. Extremities & Back:  no deformities, clubbing, cyanosis, erythema or edema observed. Normal muscle strength and tone. Neurological:  no gross motor or sensory deficits noted, affect appropriate, oriented x3. ____________________________ MOST RECENT LIPID PANEL 04/06/15  CHOL TOTL 128 mg/dl, LDL 63 NM, HDL 53 mg/dl, TRIGLYCER 60 mg/dl, ALT 29 u/l, ALK PHOS 76 u/l, CHOL/HDL 2.4 (Calc) and AST 42 u/l ____________________________ IMPRESSIONS/PLAN  1. Coronary artery three-vessel treated medically with no angina 2. Hyperlipidemia under excellent control 3. Hypertension controlled  Recommendations:  Reviewed his cardiovascular risks were discussed his other medications and interactions. Currently doing quite well and recommended followup again and one year. Call if problems. EKG is normal.  ____________________________ TODAYS ORDERS  1. 12 Lead EKG: Today  2. Return Visit: 1 year                       ____________________________ Cardiology Physician:  Kerry Hough MD [facility]

## 2015-04-13 NOTE — Patient Instructions (Addendum)
Medicare Annual Wellness Visit  [facility] and the medical providers at [facility] strive to bring you the best medical care.  In doing so we not only want to address your current medical conditions and concerns but also to detect new conditions early and prevent illness, disease and health-related problems.    Medicare offers a yearly Wellness Visit which allows our clinical staff to assess your need for preventative services including immunizations, lifestyle education, counseling to decrease risk of preventable diseases and screening for fall risk and other medical concerns.    This visit is provided free of charge (no copay) for all Medicare recipients. The clinical pharmacists at [facility] have begun to conduct these Wellness Visits which will also include a thorough review of all your medications.    As you primary medical provider recommend that you make an appointment for your Annual Wellness Visit if you have not done so already this year.  You may set up this appointment before you leave today or you may call back ([phone]) and schedule an appointment.  Please make sure when you call that you mention that you are scheduling your Annual Wellness Visit with the clinical pharmacist so that the appointment may be made for the proper length of time.     Continue current medications. Continue good therapeutic lifestyle changes which include good diet and exercise. Fall precautions discussed with patient. If an FOBT was given today- please return it to our front desk. If you are over 79 years old - you may need Prevnar 19 or the adult Pneumonia vaccine.  **Flu shots will be available soon--- please call and schedule a FLU-CLINIC appointment**  After your visit with [place] today you will receive a survey in the mail or online from [employer] regarding your care with [place]. Please take a moment to fill this out. Your feedback is  very important to [place] as you can help [place] better understand your patient needs as well as improve your experience and satisfaction. WE CARE ABOUT YOU!!!   **Please join [place] SEPT.22, 2016 from 5:00 to 7:00pm for our OPEN HOUSE! Come out and meet our NEW providers** Follow-up with cardiology as planned Follow-up with urology as planned Follow-up with neurology as planned At the next visit we will make sure we get a repeat CBC BMP as well as a lipid liver panel. Continue to exercise regularly

## 2015-04-13 NOTE — Progress Notes (Signed)
Subjective:    Patient ID: Keith Hill, male    DOB: 07-Apr-1932, 81 y.o.   MRN: [id number]  HPI Pt here for follow up and management of chronic medical problems which includes hypertension and hyperlipidemia. He is taking medications regularly. The patient just saw Dr. Tollie Eth this morning who is his cardiologist and who gave him a good report with a time to return in one year. He did an EKG on him. That note was reviewed. The cardiologist had her most recent lab work with him. He has appointment scheduled with the urologist next week for follow-up of his prostate cancer. As noted he continues to exercise regularly and do will rule well and his Parkinson's disease.      Patient Active Problem List   Diagnosis Date Noted  . Back pain 11/18/2013  . Vitamin D deficiency 08/28/2013  . Hiatal hernia with gastroesophageal reflux 08/28/2013  . CAD (coronary artery disease) 04/08/2013  . Essential and other specified forms of tremor 03/31/2013  . Parkinson's 11/07/2012  . Hemorrhoids 02/17/2011  . Hyperlipidemia   . Prostate cancer   . Hypertension   . Allergic rhinitis   . Colon polyps, history of    Outpatient Encounter Prescriptions as of 04/13/2015  Medication Sig  . amLODipine (NORVASC) 10 MG tablet TAKE (1/2) TABLET DAILY.  Marland Kitchen atorvastatin (LIPITOR) 40 MG tablet TAKE ONE TABLET AT BEDTIME  . carbidopa-levodopa (SINEMET IR) 25-100 MG per tablet Take 1 tablet by mouth 3 (three) times daily.  . Cholecalciferol (VITAMIN D3) 5000 UNITS CAPS Take 1 capsule by mouth 4 (four) times a week.   . diphenhydrAMINE (BENADRYL) 25 MG tablet Take 25 mg by mouth at bedtime as needed for itching (1/2 tablet).  Marland Kitchen docusate sodium (COLACE) 100 MG capsule Take 100 mg by mouth 2 (two) times daily. daily  . fluticasone (FLONASE) 50 MCG/ACT nasal spray INHALE 1 SPRAYS INTO EACH NOSTRIL ONCE DAILY  . FOLGARD RX 2.2-25-1 MG TABS TAKE 1 TABLET DAILY  . Krill Oil 300 MG CAPS Take by mouth daily.  .  Probiotic Product ([place]) Take by mouth 1 dose over 24 hours.    . rasagiline (AZILECT) 1 MG TABS tablet Take 1 tablet (1 mg total) by mouth daily.  Marland Kitchen terazosin (HYTRIN) 2 MG capsule TAKE (1) CAPSULE DAILY AT BEDTIME.  . valsartan-hydrochlorothiazide (DIOVAN-HCT) 320-25 MG per tablet TAKE 1 TABLET ONCE A DAY   No facility-administered encounter medications on file as of 04/13/2015.      Review of Systems  Constitutional: Negative.   HENT: Positive for congestion and sinus pressure.   Eyes: Negative.   Respiratory: Negative.   Cardiovascular: Negative.   Gastrointestinal: Negative.   Endocrine: Negative.   Genitourinary: Negative.   Musculoskeletal: Negative.   Skin: Negative.   Allergic/Immunologic: Negative.   Neurological: Negative.   Hematological: Negative.   Psychiatric/Behavioral: Negative.        Objective:   Physical Exam  Constitutional: He is oriented to person, place, and time. He appears well-developed and well-nourished. No distress.  HENT:  Head: Normocephalic and atraumatic.  Right Ear: External ear normal.  Left Ear: External ear normal.  Nose: Nose normal.  Mouth/Throat: Oropharynx is clear and moist. No oropharyngeal exudate.  Minimal nasal congestion  Eyes: Conjunctivae and EOM are normal. Pupils are equal, round, and reactive to light. Right eye exhibits no discharge. Left eye exhibits no discharge. No scleral icterus.  Neck: Normal range of motion. Neck supple. No thyromegaly  present.  Cardiovascular: Normal rate, regular rhythm, normal heart sounds and intact distal pulses.  Exam reveals no gallop and no friction rub.   No murmur heard. At 72/m  Pulmonary/Chest: Effort normal and breath sounds normal. No respiratory distress. He has no wheezes. He has no rales. He exhibits no tenderness.  Abdominal: Soft. Bowel sounds are normal. He exhibits no mass. There is no tenderness. There is no rebound and no guarding.  Genitourinary:  The  patient is to see his urologist next week and will take a copy of the PSA test with him to that visit  Musculoskeletal: Normal range of motion. He exhibits no edema or tenderness.  There was slight discomfort at the waistline in the low back with raising the left leg. Other movements route were good including abduction of either hip.  Lymphadenopathy:    He has no cervical adenopathy.  Neurological: He is alert and oriented to person, place, and time. He has normal reflexes. No cranial nerve deficit.  Minimal tremor apparent today  Skin: Skin is warm and dry. No rash noted. No erythema. No pallor.  Psychiatric: He has a normal mood and affect. His behavior is normal. Judgment and thought content normal.  Nursing note and vitals reviewed.  BP 108/56 mmHg  Pulse 80  Temp(Src) 97.3 F (36.3 C) (Oral)  Ht 5\' 8"  (1.727 m)  Wt 168 lb (76.204 kg)  BMI 25.55 kg/m2  [facility] reading (PRIMARY) by  Dr.Moore-LS spine-- degenerative changes especially L5-S1        Assessment & Plan:  1. Hypertension -The blood pressure is under good control he should continue with current treatment  2. Vitamin D deficiency -Continue with current treatment  3. Prostate cancer -The PSA is slightly up compared to the previous check and he plans to see the urologist next week  4. Hyperlipemia -All cholesterol numbers were good with current treatment  5. Parkinson's -He is doing extremely well with this problem and continues to follow-up with the neurologist. He exercises regularly.  6. Allergic rhinitis due to pollen -He has used Flonase in the past but is not using it currently and he will restart this with 1 spray each nostril at bedtime in addition to taking Benadryl 25 mg at bedtime - fluticasone (FLONASE) 50 MCG/ACT nasal spray; INHALE 1 SPRAYS INTO EACH NOSTRIL ONCE DAILY  Dispense: 16 g; Refill: 2  7. Left-sided low back pain with left-sided sciatica -He can take Tylenol as needed for pain - DG  Lumbar Spine 2-3 Views; Future  Patient Instructions                       Medicare Annual Wellness Visit  [facility] and the medical providers at [facility] strive to bring you the best medical care.  In doing so we not only want to address your current medical conditions and concerns but also to detect new conditions early and prevent illness, disease and health-related problems.    Medicare offers a yearly Wellness Visit which allows our clinical staff to assess your need for preventative services including immunizations, lifestyle education, counseling to decrease risk of preventable diseases and screening for fall risk and other medical concerns.    This visit is provided free of charge (no copay) for all Medicare recipients. The clinical pharmacists at [facility] have begun to conduct these Wellness Visits which will also include a thorough review of all your medications.    As you  primary medical provider recommend that you make an appointment for your Annual Wellness Visit if you have not done so already this year.  You may set up this appointment before you leave today or you may call back ([phone]) and schedule an appointment.  Please make sure when you call that you mention that you are scheduling your Annual Wellness Visit with the clinical pharmacist so that the appointment may be made for the proper length of time.     Continue current medications. Continue good therapeutic lifestyle changes which include good diet and exercise. Fall precautions discussed with patient. If an FOBT was given today- please return it to our front desk. If you are over 57 years old - you may need Prevnar 1 or the adult Pneumonia vaccine.  **Flu shots will be available soon--- please call and schedule a FLU-CLINIC appointment**  After your visit with [place] today you will receive a survey in the mail or online from [employer] regarding your care with [place].  Please take a moment to fill this out. Your feedback is very important to [place] as you can help [place] better understand your patient needs as well as improve your experience and satisfaction. WE CARE ABOUT YOU!!!   **Please join [place] SEPT.22, 2016 from 5:00 to 7:00pm for our OPEN HOUSE! Come out and meet our NEW providers** Follow-up with cardiology as planned Follow-up with urology as planned Follow-up with neurology as planned At the next visit we will make sure we get a repeat CBC BMP as well as a lipid liver panel. Continue to exercise regularly   Arrie Senate MD

## 2015-04-15 ENCOUNTER — Ambulatory Visit: Payer: Medicare Other | Admitting: Family Medicine

## 2015-04-15 ENCOUNTER — Other Ambulatory Visit: Payer: Medicare Other

## 2015-04-15 DIAGNOSIS — Z1212 Encounter for screening for malignant neoplasm of rectum: Secondary | ICD-10-CM | POA: Diagnosis not present

## 2015-04-15 DIAGNOSIS — C61 Malignant neoplasm of prostate: Secondary | ICD-10-CM | POA: Diagnosis not present

## 2015-04-15 NOTE — Progress Notes (Signed)
Lab only 

## 2015-04-17 LAB — FECAL OCCULT BLOOD, IMMUNOCHEMICAL: FECAL OCCULT BLD: NEGATIVE

## 2015-04-27 ENCOUNTER — Other Ambulatory Visit: Payer: Self-pay | Admitting: Physician Assistant

## 2015-04-27 ENCOUNTER — Encounter: Payer: Self-pay | Admitting: Physician Assistant

## 2015-04-27 ENCOUNTER — Ambulatory Visit (INDEPENDENT_AMBULATORY_CARE_PROVIDER_SITE_OTHER): Payer: Medicare Other | Admitting: Physician Assistant

## 2015-04-27 VITALS — BP 137/70 | HR 68 | Temp 97.5°F | Ht 68.0 in | Wt 167.0 lb

## 2015-04-27 DIAGNOSIS — B078 Other viral warts: Secondary | ICD-10-CM | POA: Diagnosis not present

## 2015-04-27 DIAGNOSIS — D485 Neoplasm of uncertain behavior of skin: Secondary | ICD-10-CM

## 2015-04-27 NOTE — Progress Notes (Signed)
Patient ID: Keith Hill, male   DOB: 1932/06/28, 81 y.o.   MRN: [id number]  81 y/o male presents for new appearing lesion on tip of nose x 2 weeks. Asymptomatic regarding pain, itch, bleeding. No h/o skin CA.   Hyperkeratotic papule (,.66mm) on tip of nose biopsied to r/o dysplasia. Monsels applied post biopsy. Await pathology and treat accordingly.   1. Neoplasm of uncertain behavior of skin Biopsied via shave biopsy to r/o dysplasia. Apply mupirocin or vaseline until healed. Will treat according to biopsy results.     Comfort Iversen A. Benjamin Stain PA-C

## 2015-04-30 LAB — PATHOLOGY

## 2015-05-17 ENCOUNTER — Other Ambulatory Visit: Payer: Self-pay | Admitting: Family Medicine

## 2015-06-01 ENCOUNTER — Ambulatory Visit (INDEPENDENT_AMBULATORY_CARE_PROVIDER_SITE_OTHER): Payer: Medicare Other

## 2015-06-01 DIAGNOSIS — Z23 Encounter for immunization: Secondary | ICD-10-CM | POA: Diagnosis not present

## 2015-06-17 ENCOUNTER — Ambulatory Visit (INDEPENDENT_AMBULATORY_CARE_PROVIDER_SITE_OTHER): Payer: Medicare Other | Admitting: Family Medicine

## 2015-06-17 ENCOUNTER — Encounter: Payer: Self-pay | Admitting: Family Medicine

## 2015-06-17 VITALS — BP 102/48 | HR 71 | Temp 97.7°F | Ht 68.0 in | Wt 168.8 lb

## 2015-06-17 DIAGNOSIS — J301 Allergic rhinitis due to pollen: Secondary | ICD-10-CM | POA: Diagnosis not present

## 2015-06-17 DIAGNOSIS — M545 Low back pain, unspecified: Secondary | ICD-10-CM

## 2015-06-17 MED ORDER — MOMETASONE FUROATE 50 MCG/ACT NA SUSP: 2.0000 | Freq: Every day | NASAL | Status: DC

## 2015-06-17 NOTE — Patient Instructions (Addendum)
Great to meet you!  We will work on the MRI, if we cannot get it approved we will ask you to see Dr. Fabian November for his opinion  Try nasonex instead of flonase, start with twice daily for 1 week then go down to once daily Start taking plain zyrtec (over the counter) 1 pill daily  Stop sinex, it has the same medicine as afrin, so your nose may have increased running for 3 days after you stop

## 2015-06-17 NOTE — Progress Notes (Signed)
   HPI  Patient presents today referred rhinitis and low back pain.  Low back pain Patient describes bilateral low back pain that stretches from midline and radiates bilaterally on both sides, is worse with wart flexion Has been there present for about 2-3 years but much worse over last 2-3 weeks. He has a history of prostate cancer which is being treated with observation.  Rhinitis He has had increased runny nose for the last 2-3 weeks. Flonase doesn't seem to help. He is using sinex daily Has not tried any oral antihistamines He states that it's mostly irritating at night but otherwise is okay  PMH: Smoking status noted ROS: Per HPI  Objective: BP 102/48 mmHg  Pulse 71  Temp(Src) 97.7 F (36.5 C) (Oral)  Ht $R'5\' 8"'Jo$  (1.727 m)  Wt 168 lb 12.8 oz (76.567 kg)  BMI 25.67 kg/m2 Gen: NAD, alert, cooperative with exam HEENT: NCAT, nares clear bilaterally, oropharynx clear CV: RRR, good S1/S2, no murmur Resp: CTABL, no wheezes, non-labored Ext: No edema, warm Neuro: Alert and oriented, strength 5/5 and sensation intact in bilateral lower extremities, 2+ patellar tendon reflexes, normal gait Skeletal: No bony tenderness or paraspinal tenderness of the lumbar spine  Assessment and plan:  # Lumbar back pain Red flags with continued pain after several months, age greater than 63, and history of prostate cancer No sciatica Tylenol helping with pain but is not relieving it. We'll pursue MRI with history of prostate cancer to rule out metastasis, previous x-ray 2 months ago with signs of arthritis  # Allergic rhinitis Change from Flonase to Nasonex Abigail Zyrtec Consider Astelin Stop sinex   Orders Placed This Encounter  Procedures  . MR Lumbar Spine Wo Contrast    Standing Status: Future     Number of Occurrences:      Standing Expiration Date: 08/16/2016    Order Specific Question:  Reason for Exam (SYMPTOM  OR DIAGNOSIS REQUIRED)    Answer:  Low back pain with prostate  cancer, r/o met    Order Specific Question:  Preferred imaging location?    Answer:  [facility]    Order Specific Question:  Does the patient have a pacemaker or implanted devices?    Answer:  No    Order Specific Question:  What is the patient's sedation requirement?    Answer:  No Sedation    Meds ordered this encounter  Medications  . mometasone (NASONEX) 50 MCG/ACT nasal spray    Sig: Place 2 sprays into the nose daily.    Dispense:  17 g    Refill:  [place], MD [facility] 06/17/2015, 11:15 AM

## 2015-06-21 ENCOUNTER — Telehealth: Payer: Self-pay | Admitting: Family Medicine

## 2015-06-21 ENCOUNTER — Telehealth: Payer: Self-pay

## 2015-06-21 NOTE — Telephone Encounter (Signed)
A hip MRI is not as easily indicated as a low back MRI for him.   I would recommend OV or seeing his [place], MD [facility] 06/21/2015, 5:15 PM

## 2015-06-21 NOTE — Telephone Encounter (Signed)
Patient calling that hip is hurting really bad and needs an MRI hip also.  He is scheduled for lumbar 07/08/15

## 2015-06-22 DIAGNOSIS — M47816 Spondylosis without myelopathy or radiculopathy, lumbar region: Secondary | ICD-10-CM | POA: Diagnosis not present

## 2015-06-22 DIAGNOSIS — M65341 Trigger finger, right ring finger: Secondary | ICD-10-CM | POA: Diagnosis not present

## 2015-06-22 NOTE — Telephone Encounter (Signed)
Stp and he has an appt with ortho today so will talk to them and then let us know if he needs anything else.

## 2015-06-28 ENCOUNTER — Other Ambulatory Visit: Payer: Self-pay | Admitting: Family Medicine

## 2015-07-08 ENCOUNTER — Ambulatory Visit (HOSPITAL_COMMUNITY)
Admission: RE | Admit: 2015-07-08 | Discharge: 2015-07-08 | Disposition: A | Discharge status: Home / Self Care | Payer: Medicare Other | Source: Ambulatory Visit | Attending: Family Medicine | Admitting: Family Medicine

## 2015-07-08 ENCOUNTER — Other Ambulatory Visit: Payer: Self-pay | Admitting: Family Medicine

## 2015-07-08 DIAGNOSIS — M5126 Other intervertebral disc displacement, lumbar region: Secondary | ICD-10-CM

## 2015-07-08 DIAGNOSIS — M545 Low back pain, unspecified: Secondary | ICD-10-CM

## 2015-07-08 DIAGNOSIS — Z8546 Personal history of malignant neoplasm of prostate: Secondary | ICD-10-CM | POA: Insufficient documentation

## 2015-07-08 DIAGNOSIS — M4807 Spinal stenosis, lumbosacral region: Secondary | ICD-10-CM | POA: Diagnosis not present

## 2015-07-08 DIAGNOSIS — M4806 Spinal stenosis, lumbar region: Secondary | ICD-10-CM | POA: Diagnosis not present

## 2015-07-08 DIAGNOSIS — M5136 Other intervertebral disc degeneration, lumbar region: Secondary | ICD-10-CM

## 2015-07-19 ENCOUNTER — Other Ambulatory Visit: Payer: Self-pay | Admitting: Family Medicine

## 2015-07-20 DIAGNOSIS — M1288 Other specific arthropathies, not elsewhere classified, other specified site: Secondary | ICD-10-CM | POA: Diagnosis not present

## 2015-07-20 DIAGNOSIS — M5136 Other intervertebral disc degeneration, lumbar region: Secondary | ICD-10-CM | POA: Diagnosis not present

## 2015-07-31 DIAGNOSIS — M1288 Other specific arthropathies, not elsewhere classified, other specified site: Secondary | ICD-10-CM | POA: Diagnosis not present

## 2015-07-31 DIAGNOSIS — M5136 Other intervertebral disc degeneration, lumbar region: Secondary | ICD-10-CM | POA: Diagnosis not present

## 2015-07-31 DIAGNOSIS — M47816 Spondylosis without myelopathy or radiculopathy, lumbar region: Secondary | ICD-10-CM | POA: Diagnosis not present

## 2015-08-04 DIAGNOSIS — M47816 Spondylosis without myelopathy or radiculopathy, lumbar region: Secondary | ICD-10-CM | POA: Diagnosis not present

## 2015-08-09 ENCOUNTER — Other Ambulatory Visit: Payer: Self-pay | Admitting: Family Medicine

## 2015-08-10 ENCOUNTER — Other Ambulatory Visit (INDEPENDENT_AMBULATORY_CARE_PROVIDER_SITE_OTHER): Payer: Medicare Other

## 2015-08-10 DIAGNOSIS — R7989 Other specified abnormal findings of blood chemistry: Secondary | ICD-10-CM | POA: Diagnosis not present

## 2015-08-10 DIAGNOSIS — C61 Malignant neoplasm of prostate: Secondary | ICD-10-CM

## 2015-08-10 DIAGNOSIS — G20 Parkinson's disease: Secondary | ICD-10-CM

## 2015-08-10 DIAGNOSIS — E559 Vitamin D deficiency, unspecified: Secondary | ICD-10-CM | POA: Diagnosis not present

## 2015-08-10 DIAGNOSIS — D649 Anemia, unspecified: Secondary | ICD-10-CM | POA: Diagnosis not present

## 2015-08-10 DIAGNOSIS — R718 Other abnormality of red blood cells: Secondary | ICD-10-CM | POA: Diagnosis not present

## 2015-08-10 DIAGNOSIS — I10 Essential (primary) hypertension: Secondary | ICD-10-CM

## 2015-08-10 DIAGNOSIS — E785 Hyperlipidemia, unspecified: Secondary | ICD-10-CM

## 2015-08-10 NOTE — Addendum Note (Signed)
Addended by: Earlene Plater on: 08/10/2015 08:18 AM   Modules accepted: Orders

## 2015-08-10 NOTE — Progress Notes (Signed)
Lab only 

## 2015-08-11 ENCOUNTER — Other Ambulatory Visit: Payer: Self-pay | Admitting: *Deleted

## 2015-08-11 DIAGNOSIS — I10 Essential (primary) hypertension: Secondary | ICD-10-CM

## 2015-08-11 DIAGNOSIS — E559 Vitamin D deficiency, unspecified: Secondary | ICD-10-CM

## 2015-08-11 DIAGNOSIS — G20 Parkinson's disease: Secondary | ICD-10-CM | POA: Diagnosis not present

## 2015-08-11 DIAGNOSIS — C61 Malignant neoplasm of prostate: Secondary | ICD-10-CM | POA: Diagnosis not present

## 2015-08-11 DIAGNOSIS — R718 Other abnormality of red blood cells: Secondary | ICD-10-CM | POA: Diagnosis not present

## 2015-08-11 DIAGNOSIS — E785 Hyperlipidemia, unspecified: Secondary | ICD-10-CM

## 2015-08-11 DIAGNOSIS — R7989 Other specified abnormal findings of blood chemistry: Secondary | ICD-10-CM | POA: Diagnosis not present

## 2015-08-11 LAB — PSA, TOTAL AND FREE
PSA FREE PCT: 16.5 %
PSA FREE: 0.71 ng/mL
Prostate Specific Ag, Serum: 4.3 ng/mL — ABNORMAL HIGH (ref 0.0–4.0)

## 2015-08-11 LAB — VITAMIN B12: Vitamin B-12: 1081 pg/mL — ABNORMAL HIGH (ref 211–946)

## 2015-08-11 NOTE — Addendum Note (Signed)
Addended by: Zannie Cove on: 08/11/2015 04:38 PM   Modules accepted: Orders

## 2015-08-12 LAB — CBC WITH DIFFERENTIAL/PLATELET
Basophils Absolute: 0 10*3/uL (ref 0.0–0.2)
Basos: 0 %
EOS (ABSOLUTE): 0.2 10*3/uL (ref 0.0–0.4)
EOS: 2 %
HEMATOCRIT: 39.9 % (ref 37.5–51.0)
HEMOGLOBIN: 13.6 g/dL (ref 12.6–17.7)
IMMATURE GRANULOCYTES: 0 %
Immature Grans (Abs): 0 10*3/uL (ref 0.0–0.1)
LYMPHS ABS: 1.9 10*3/uL (ref 0.7–3.1)
Lymphs: 21 %
MCH: 33.4 pg — ABNORMAL HIGH (ref 26.6–33.0)
MCHC: 34.1 g/dL (ref 31.5–35.7)
MCV: 98 fL — ABNORMAL HIGH (ref 79–97)
MONOCYTES: 10 %
Monocytes Absolute: 0.9 10*3/uL (ref 0.1–0.9)
Neutrophils Absolute: 5.8 10*3/uL (ref 1.4–7.0)
Neutrophils: 67 %
Platelets: 272 10*3/uL (ref 150–379)
RBC: 4.07 x10E6/uL — AB (ref 4.14–5.80)
RDW: 13.6 % (ref 12.3–15.4)
WBC: 8.8 10*3/uL (ref 3.4–10.8)

## 2015-08-13 LAB — BMP8+EGFR
BUN / CREAT RATIO: 21 (ref 10–22)
BUN: 23 mg/dL (ref 8–27)
CHLORIDE: 100 mmol/L (ref 96–106)
CO2: 24 mmol/L (ref 18–29)
Calcium: 9.2 mg/dL (ref 8.6–10.2)
Creatinine, Ser: 1.1 mg/dL (ref 0.76–1.27)
GFR, EST AFRICAN AMERICAN: 72 mL/min/{1.73_m2} (ref >59)
GFR, EST NON AFRICAN AMERICAN: 63 mL/min/{1.73_m2} (ref >59)
Glucose: 93 mg/dL (ref 65–99)
POTASSIUM: 4 mmol/L (ref 3.5–5.2)
SODIUM: 140 mmol/L (ref 134–144)

## 2015-08-13 LAB — HEPATIC FUNCTION PANEL
ALBUMIN: 4.4 g/dL (ref 3.5–4.7)
ALK PHOS: 87 IU/L (ref 39–117)
ALT: 46 IU/L — ABNORMAL HIGH (ref 0–44)
AST: 23 IU/L (ref 0–40)
BILIRUBIN, DIRECT: 0.18 mg/dL (ref 0.00–0.40)
Bilirubin Total: 0.6 mg/dL (ref 0.0–1.2)
TOTAL PROTEIN: 7 g/dL (ref 6.0–8.5)

## 2015-08-13 LAB — LIPID PANEL
CHOL/HDL RATIO: 1.9 ratio (ref 0.0–5.0)
Cholesterol, Total: 146 mg/dL (ref 100–199)
HDL: 78 mg/dL (ref >39)
LDL Calculated: 59 mg/dL (ref 0–99)
Triglycerides: 44 mg/dL (ref 0–149)
VLDL Cholesterol Cal: 9 mg/dL (ref 5–40)

## 2015-08-13 LAB — VITAMIN D 25 HYDROXY (VIT D DEFICIENCY, FRACTURES): VIT D 25 HYDROXY: 40.6 ng/mL (ref 30.0–100.0)

## 2015-08-13 LAB — SPECIMEN STATUS REPORT

## 2015-08-17 ENCOUNTER — Ambulatory Visit (INDEPENDENT_AMBULATORY_CARE_PROVIDER_SITE_OTHER): Payer: Medicare Other | Admitting: Family Medicine

## 2015-08-17 ENCOUNTER — Encounter: Payer: Self-pay | Admitting: Family Medicine

## 2015-08-17 ENCOUNTER — Ambulatory Visit (INDEPENDENT_AMBULATORY_CARE_PROVIDER_SITE_OTHER): Payer: Medicare Other

## 2015-08-17 VITALS — BP 125/64 | HR 79 | Temp 98.3°F | Ht 68.0 in | Wt 169.0 lb

## 2015-08-17 DIAGNOSIS — I2583 Coronary atherosclerosis due to lipid rich plaque: Secondary | ICD-10-CM

## 2015-08-17 DIAGNOSIS — I2510 Atherosclerotic heart disease of native coronary artery without angina pectoris: Secondary | ICD-10-CM | POA: Diagnosis not present

## 2015-08-17 DIAGNOSIS — C61 Malignant neoplasm of prostate: Secondary | ICD-10-CM

## 2015-08-17 DIAGNOSIS — G20 Parkinson's disease: Secondary | ICD-10-CM

## 2015-08-17 DIAGNOSIS — E559 Vitamin D deficiency, unspecified: Secondary | ICD-10-CM | POA: Diagnosis not present

## 2015-08-17 DIAGNOSIS — E785 Hyperlipidemia, unspecified: Secondary | ICD-10-CM

## 2015-08-17 DIAGNOSIS — I10 Essential (primary) hypertension: Secondary | ICD-10-CM

## 2015-08-17 NOTE — Progress Notes (Signed)
Subjective:    Patient ID: Keith Hill, male    DOB: [birth year], 81 y.o.   MRN: [id number]  HPI Pt here for follow up and management of chronic medical problems which includes hypertension. He is taking medications regularly. The patient complains of some back pain today. He is followed regularly by his cardiologist Dr. Wynonia Lawman. He is also being followed for his Parkinson's disease by Dr. Jannifer Franklin. The patient recently had lumbar spine films and this basically showed degenerative changes in the lumbar spine and he will be given a copy of this report. He sees Dr. Jeffie Pollock regularly because of his history of prostate cancer. He also is followed regularly by Dr. Wynonia Lawman, the cardiologist for his heart. He is due to get a chest x-ray today. His lab work that has been drawn, is not back yet. The patient denies chest pain shortness of breath tremor swallowing heartburn and indigestion nausea vomiting diarrhea or blood in the stool. He is passing his water without problems and plans to see Dr. Jeffie Pollock saline. He sees the cardiologist once yearly and August. He also follows up regularly with the neurologist for his Parkinson's as mentioned above. He exercises regularly at the rehabilitation center [place]-door. He plans to see the orthopedist again for more injections in his back where he has a bulging disc.      Patient Active Problem List   Diagnosis Date Noted  . Back pain 11/18/2013  . Vitamin D deficiency 08/28/2013  . Hiatal hernia with gastroesophageal reflux 08/28/2013  . CAD (coronary artery disease) 04/08/2013  . Essential and other specified forms of tremor 03/31/2013  . Parkinson's 11/07/2012  . Hemorrhoids 02/17/2011  . Hyperlipidemia with target LDL less than 70   . Prostate cancer ([place])   . Hypertension   . Allergic rhinitis   . Colon polyps, history of    Outpatient Encounter Prescriptions as of 08/17/2015  Medication Sig  . amLODipine (NORVASC) 10 MG tablet TAKE (1/2) TABLET DAILY.  Marland Kitchen  atorvastatin (LIPITOR) 40 MG tablet TAKE ONE TABLET AT BEDTIME  . carbidopa-levodopa (SINEMET IR) 25-100 MG per tablet Take 1 tablet by mouth 3 (three) times daily.  . Cholecalciferol (VITAMIN D3) 5000 UNITS CAPS Take 1 capsule by mouth 4 (four) times a week.   . docusate sodium (COLACE) 100 MG capsule Take 100 mg by mouth 2 (two) times daily. daily  . FOLGARD RX 2.2-25-1 MG TABS TAKE 1 TABLET DAILY  . Krill Oil 300 MG CAPS Take by mouth daily.  . Probiotic Product ([place]) Take by mouth 1 dose over 24 hours.    . rasagiline (AZILECT) 1 MG TABS tablet Take 1 tablet (1 mg total) by mouth daily.  Marland Kitchen terazosin (HYTRIN) 2 MG capsule TAKE (1) CAPSULE DAILY AT BEDTIME.  . valsartan-hydrochlorothiazide (DIOVAN-HCT) 320-25 MG per tablet TAKE 1 TABLET ONCE A DAY  . [DISCONTINUED] diphenhydrAMINE (BENADRYL) 25 MG tablet Take 25 mg by mouth at bedtime as needed for itching (1/2 tablet).  . [DISCONTINUED] mometasone (NASONEX) 50 MCG/ACT nasal spray Place 2 sprays into the nose daily.   No facility-administered encounter medications on file as of 08/17/2015.      Review of Systems  Constitutional: Negative.   HENT: Negative.   Eyes: Negative.   Respiratory: Negative.   Cardiovascular: Negative.   Gastrointestinal: Negative.   Endocrine: Negative.   Genitourinary: Negative.   Musculoskeletal: Positive for back pain.  Skin: Negative.   Allergic/Immunologic: Negative.   Neurological: Negative.   Hematological:  Negative.   Psychiatric/Behavioral: Negative.        Objective:   Physical Exam  Constitutional: He is oriented to person, place, and time. He appears well-developed and well-nourished. No distress.  HENT:  Head: Normocephalic and atraumatic.  Right Ear: External ear normal.  Nose: Nose normal.  Mouth/Throat: Oropharynx is clear and moist. No oropharyngeal exudate.  Slight cerumen left ear canal  Eyes: Conjunctivae and EOM are normal. Pupils are equal, round, and  reactive to light. Right eye exhibits no discharge. Left eye exhibits no discharge. No scleral icterus.  Neck: Normal range of motion. Neck supple. No thyromegaly present.  No carotid bruits  Cardiovascular: Normal rate, regular rhythm, normal heart sounds and intact distal pulses.   No murmur heard. The heart has a regular rate and rhythm at 72/m  Pulmonary/Chest: Effort normal and breath sounds normal. No respiratory distress. He has no wheezes. He has no rales. He exhibits no tenderness.  Clear anteriorly and posteriorly  Abdominal: Soft. Bowel sounds are normal. He exhibits no mass. There is no tenderness. There is no rebound and no guarding.  Nontender without masses or bruits  Genitourinary:  This patient has an upcoming appointment with his urologist soon.  Musculoskeletal: Normal range of motion. He exhibits no edema.  Lymphadenopathy:    He has no cervical adenopathy.  Neurological: He is alert and oriented to person, place, and time. He has normal reflexes. No cranial nerve deficit.  Skin: Skin is warm and dry. No rash noted.  Psychiatric: He has a normal mood and affect. His behavior is normal. Judgment and thought content normal.  Nursing note and vitals reviewed.  BP 125/64 mmHg  Pulse 79  Temp(Src) 98.3 F (36.8 C) (Oral)  Ht 5\' 8"  (1.727 m)  Wt 169 lb (76.658 kg)  BMI 25.70 kg/m2        Assessment & Plan:  1. Hypertension -Blood pressure is good today and he will continue with his current treatment. - DG Chest 2 View; Future  2. Vitamin D deficiency -Continue with current treatment pending results of lab work  3. Prostate cancer [facility]) -Continue follow-up with his urologist Dr. Jeffie Pollock  4. Hyperlipemia -Continue current treatment pending results of lab work and follow-up radiology - DG Chest 2 View; Future  5. Parkinson's -Continue follow-up with neurology and Dr. Jannifer Franklin  6. Hyperlipidemia with target LDL less than 70 -Continue aggressive management with  diet exercise and medication  7. Coronary artery disease due to lipid rich plaque -Continue follow-up with cardiology - DG Chest 2 View; Future   Patient Instructions                       Medicare Annual Wellness Visit  [place] [facility] and the medical providers at [facility] strive to bring you the best medical care.  In doing so we not only want to address your current medical conditions and concerns but also to detect new conditions early and prevent illness, disease and health-related problems.    Medicare offers a yearly Wellness Visit which allows our clinical staff to assess your need for preventative services including immunizations, lifestyle education, counseling to decrease risk of preventable diseases and screening for fall risk and other medical concerns.    This visit is provided free of charge (no copay) for all Medicare recipients. The clinical pharmacists at [facility] have begun to conduct these Wellness Visits which will also include a thorough review of all your  medications.    As you primary medical provider recommend that you make an appointment for your Annual Wellness Visit if you have not done so already this year.  You may set up this appointment before you leave today or you may call back [id number] and schedule an appointment.  Please make sure when you call that you mention that you are scheduling your Annual Wellness Visit with the clinical pharmacist so that the appointment may be made for the proper length of time.     Continue current medications. Continue good therapeutic lifestyle changes which include good diet and exercise. Fall precautions discussed with patient. If an FOBT was given today- please return it to our front desk. If you are over 57 years old - you may need Prevnar 52 or the adult Pneumonia vaccine.  **Flu shots are available--- please call and schedule a FLU-CLINIC appointment**  After your visit  with [place] today you will receive a survey in the mail or online from [employer] regarding your care with [place]. Please take a moment to fill this out. Your feedback is very important to [place] as you can help [place] better understand your patient needs as well as improve your experience and satisfaction. WE CARE ABOUT YOU!!!   Follow-up with orthopedist, neurologist, and cardiology as planned Stay active physically always been careful not to fall Continue with current medicines and stay well hydrated this winter   Arrie Senate MD

## 2015-08-17 NOTE — Patient Instructions (Addendum)
Medicare Annual Wellness Visit  [facility] and the medical providers at [facility] strive to bring you the best medical care.  In doing so we not only want to address your current medical conditions and concerns but also to detect new conditions early and prevent illness, disease and health-related problems.    Medicare offers a yearly Wellness Visit which allows our clinical staff to assess your need for preventative services including immunizations, lifestyle education, counseling to decrease risk of preventable diseases and screening for fall risk and other medical concerns.    This visit is provided free of charge (no copay) for all Medicare recipients. The clinical pharmacists at [facility] have begun to conduct these Wellness Visits which will also include a thorough review of all your medications.    As you primary medical provider recommend that you make an appointment for your Annual Wellness Visit if you have not done so already this year.  You may set up this appointment before you leave today or you may call back [phone] and schedule an appointment.  Please make sure when you call that you mention that you are scheduling your Annual Wellness Visit with the clinical pharmacist so that the appointment may be made for the proper length of time.     Continue current medications. Continue good therapeutic lifestyle changes which include good diet and exercise. Fall precautions discussed with patient. If an FOBT was given today- please return it to our front desk. If you are over 50 years old - you may need Prevnar 46 or the adult Pneumonia vaccine.  **Flu shots are available--- please call and schedule a FLU-CLINIC appointment**  After your visit with [place] today you will receive a survey in the mail or online from [employer] regarding your care with [place]. Please take a moment to fill this out. Your feedback is very  important to [place] as you can help [place] better understand your patient needs as well as improve your experience and satisfaction. WE CARE ABOUT YOU!!!   Follow-up with orthopedist, neurologist, and cardiology as planned Stay active physically always been careful not to fall Continue with current medicines and stay well hydrated this winter

## 2015-08-18 ENCOUNTER — Other Ambulatory Visit: Payer: Self-pay | Admitting: *Deleted

## 2015-08-18 ENCOUNTER — Telehealth: Payer: Self-pay | Admitting: Family Medicine

## 2015-08-18 NOTE — Progress Notes (Signed)
Patient aware of results. PSA sent to Dr. Jeffie Pollock.

## 2015-08-18 NOTE — Telephone Encounter (Signed)
New medications from OTC added to list in chart.

## 2015-09-04 ENCOUNTER — Other Ambulatory Visit: Payer: Self-pay | Admitting: Family Medicine

## 2015-09-07 DIAGNOSIS — M47816 Spondylosis without myelopathy or radiculopathy, lumbar region: Secondary | ICD-10-CM | POA: Diagnosis not present

## 2015-09-07 DIAGNOSIS — M5136 Other intervertebral disc degeneration, lumbar region: Secondary | ICD-10-CM | POA: Diagnosis not present

## 2015-09-07 DIAGNOSIS — M1288 Other specific arthropathies, not elsewhere classified, other specified site: Secondary | ICD-10-CM | POA: Diagnosis not present

## 2015-09-12 ENCOUNTER — Emergency Department (HOSPITAL_COMMUNITY): Payer: Medicare Other

## 2015-09-12 ENCOUNTER — Encounter (HOSPITAL_COMMUNITY): Payer: Self-pay | Admitting: Emergency Medicine

## 2015-09-12 ENCOUNTER — Emergency Department (HOSPITAL_COMMUNITY)
Admission: EM | Admit: 2015-09-12 | Discharge: 2015-09-12 | Disposition: A | Discharge status: Home / Self Care | Payer: Medicare Other | Attending: Emergency Medicine | Admitting: Emergency Medicine

## 2015-09-12 DIAGNOSIS — Z79899 Other long term (current) drug therapy: Secondary | ICD-10-CM | POA: Diagnosis not present

## 2015-09-12 DIAGNOSIS — Z86010 Personal history of colonic polyps: Secondary | ICD-10-CM | POA: Diagnosis not present

## 2015-09-12 DIAGNOSIS — G20 Parkinson's disease: Secondary | ICD-10-CM | POA: Insufficient documentation

## 2015-09-12 DIAGNOSIS — Z8719 Personal history of other diseases of the digestive system: Secondary | ICD-10-CM | POA: Diagnosis not present

## 2015-09-12 DIAGNOSIS — I10 Essential (primary) hypertension: Secondary | ICD-10-CM | POA: Diagnosis not present

## 2015-09-12 DIAGNOSIS — N400 Enlarged prostate without lower urinary tract symptoms: Secondary | ICD-10-CM | POA: Diagnosis not present

## 2015-09-12 DIAGNOSIS — M542 Cervicalgia: Secondary | ICD-10-CM | POA: Diagnosis not present

## 2015-09-12 DIAGNOSIS — Z8546 Personal history of malignant neoplasm of prostate: Secondary | ICD-10-CM | POA: Diagnosis not present

## 2015-09-12 DIAGNOSIS — E785 Hyperlipidemia, unspecified: Secondary | ICD-10-CM | POA: Insufficient documentation

## 2015-09-12 MED ORDER — CYCLOBENZAPRINE HCL 5 MG PO TABS: 5.0000 mg | ORAL_TABLET | Freq: Three times a day (TID) | ORAL | Status: DC | PRN

## 2015-09-12 MED ORDER — TRAMADOL HCL 50 MG PO TABS
50.0000 mg | ORAL_TABLET | Freq: Once | ORAL | Status: AC
  Administered 2015-09-12: 50 mg via ORAL
  Filled 2015-09-12: qty 1

## 2015-09-12 MED ORDER — TRAMADOL HCL 50 MG PO TABS: 50.0000 mg | ORAL_TABLET | Freq: Four times a day (QID) | ORAL | Status: DC | PRN

## 2015-09-12 NOTE — ED Notes (Addendum)
Pt reports waking up with left neck pain three days ago, pt stated his neck pain got better over the next two days, but woke up this morning with limited ROM and pain in head.  Pt denies any weakness unilaterally, blurred vision, slurred speech.  Pt alert and oriented.

## 2015-09-12 NOTE — Discharge Instructions (Signed)
X-ray shows some degeneration of your cervical disks which is not unusual your age. No fracture. Prescription for pain medicine and muscle relaxer. Hot pack. Follow-up your Dr.

## 2015-09-12 NOTE — ED Provider Notes (Signed)
CSN: [id number]     Arrival date & time 09/12/15  [id number] History  By signing my name below, I, Jolayne Panther, attest that this documentation has been prepared under the direction and in the presence of Nat Christen, MD. Electronically Signed: Jolayne Panther, Scribe. 09/12/2015. 9:10 AM.   Chief Complaint  Patient presents with  . Neck Pain   The history is provided by the patient. No language interpreter was used.    HPI Comments: Keith Hill is a 82 y.o. male who presents to the Emergency Department complaining of mild left, lateral, inferior neck pain which is worse with turning left onset this morning when he woke up. He reports that the pain initially began four days ago after sleeping on it in an uncomfortable position. His pain then resolved until it he began experiencing mild neck pain again yesterday which was then worse when he awoke this morning.    Past Medical History  Diagnosis Date  . Pancreatitis 1976  . Lumbar disc disease   . Hyperlipidemia   . Hyperplasia of prostate   . Cardiac dysrhythmia, unspecified   . Allergic rhinitis, cause unspecified   . Colon polyps   . Hemorrhoids, external   . Hiatal hernia   . GERD (gastroesophageal reflux disease)   . Hypertension   . Essential and other specified forms of tremor   . Paralysis agitans ([place]) 11/07/2012  . Prostate cancer ([place])   . Parkinson disease ([place])     dx 4-5 months ago  . Peyronie's disease    Past Surgical History  Procedure Laterality Date  . Knee surgery    . Shoulder surgery Left   . Hemorrhoid surgery    . Prostate biopsy  04/03/2013  . Prostate biopsy  11/01/2011   Family History  Problem Relation Age of Onset  . Stroke Father   . Kidney failure Father   . Alcoholism Brother   . Heart attack Brother   . Cancer Neg Hx    Social History  Substance Use Topics  . Smoking status: Never Smoker   . Smokeless tobacco: Never Used  . Alcohol Use: No    Review of Systems  A complete 10  system review of systems was obtained and all systems are negative except as noted in the HPI and PMH.   Allergies  Aspirin; Aspirin free childs; Calicum glycerophosphate; Niaspan; Prednisone; and Zocor  Home Medications   Prior to Admission medications   Medication Sig Start Date End Date Taking? Authorizing Provider  amLODipine (NORVASC) 10 MG tablet TAKE (1/2) TABLET DAILY. 08/09/15  Yes Chipper Herb, MD  atorvastatin (LIPITOR) 40 MG tablet TAKE ONE TABLET AT BEDTIME 07/19/15  Yes Chipper Herb, MD  carbidopa-levodopa (SINEMET IR) 25-100 MG per tablet Take 1 tablet by mouth 3 (three) times daily. 09/15/14  Yes Kathrynn Ducking, MD  Cetirizine HCl (ZYRTEC PO) Take by mouth.   Yes Historical Provider, MD  Cholecalciferol (VITAMIN D3) 5000 UNITS CAPS Take 1 capsule by mouth 4 (four) times a week.    Yes Historical Provider, MD  docusate sodium (COLACE) 100 MG capsule Take 100 mg by mouth 2 (two) times daily. daily   Yes Historical Provider, MD  FOLGARD RX 2.2-25-1 MG TABS TAKE 1 TABLET DAILY 09/06/15  Yes Chipper Herb, MD  Krill Oil 300 MG CAPS Take by mouth daily.   Yes Historical Provider, MD  oxymetazoline (AFRIN) 0.05 % nasal spray Place 1 spray into both nostrils 2 (  two) times daily.   Yes Historical Provider, MD  Probiotic Product ([place]) Take by mouth 1 dose over 24 hours.     Yes Historical Provider, MD  rasagiline (AZILECT) 1 MG TABS tablet Take 1 tablet (1 mg total) by mouth daily. 09/15/14  Yes Kathrynn Ducking, MD  terazosin (HYTRIN) 2 MG capsule TAKE (1) CAPSULE DAILY AT BEDTIME. 06/28/15  Yes Chipper Herb, MD  valsartan-hydrochlorothiazide (DIOVAN-HCT) 320-25 MG per tablet TAKE 1 TABLET ONCE A DAY 10/20/14  Yes Chipper Herb, MD  cyclobenzaprine (FLEXERIL) 5 MG tablet Take 1 tablet (5 mg total) by mouth 3 (three) times daily as needed for muscle spasms. 09/12/15   Nat Christen, MD  traMADol (ULTRAM) 50 MG tablet Take 1 tablet (50 mg total) by mouth every 6 (six)  hours as needed. 09/12/15   Nat Christen, MD   BP 125/85 mmHg  Pulse 85  Temp(Src) 98.2 F (36.8 C) (Oral)  Resp 18  Ht 5\' 8"  (1.727 m)  Wt 168 lb (76.204 kg)  BMI 25.55 kg/m2  SpO2 97% Physical Exam  Constitutional: He is oriented to person, place, and time. He appears well-developed and well-nourished.  HENT:  Head: Normocephalic and atraumatic.  Eyes: Conjunctivae and EOM are normal. Pupils are equal, round, and reactive to light.  Neck:  Left, lateral, inferior neck pain worse with ROM laterally to the left of neck  Musculoskeletal: Normal range of motion.  Neurological: He is alert and oriented to person, place, and time.  Skin: Skin is warm and dry.  Psychiatric: He has a normal mood and affect. His behavior is normal.  Nursing note and vitals reviewed.   ED Course  Procedures  DIAGNOSTIC STUDIES:    Oxygen Saturation is 95% on RA, adequate by my interpretation.   COORDINATION OF CARE:  8:53 AM Will order x ray of pt's neck. Discussed treatment plan with pt at bedside and pt agreed to plan.   Imaging Review Dg Cervical Spine Complete  09/12/2015  CLINICAL DATA:  Left lateral neck pain, which is progressively worsening. EXAM: CERVICAL SPINE - COMPLETE 4+ VIEW COMPARISON:  CT of the cervical spine 08/30/2010 FINDINGS: There is no evidence of cervical spine fracture or prevertebral soft tissue swelling. Alignment is normal. There are multilevel osteoarthritic changes, which have progressed since 2012, worse in the mid and lower cervical spine, with disc space narrowing, subchondral sclerosis, remodeling of vertebral bodies and osteophyte formation. Similar posterior facet arthropathy changes are seen. There is subsequent mild encroachment of the bilateral bony neural foramina inferior to C3. IMPRESSION: No evidence of acute fracture. Multilevel degenerative disc disease and posterior facet arthropathy, progressed from 2012, with mild narrowing of the bilateral neural foramina  inferior to C3. Electronically Signed   By: Fidela Salisbury M.D.   On: 09/12/2015 10:06   I have personally reviewed and evaluated these images as part of my medical decision-making.   MDM   Final diagnoses:  Neck pain   History and physical consistent with muscular pain. Plain films show no acute fracture, but multilevel degenerative disc disease.  Discharge medications Ultram and Flexeril 5 mg  I personally performed the services described in this documentation, which was scribed in my presence. The recorded information has been reviewed and is accurate.      Nat Christen, MD 09/12/15 1028

## 2015-09-14 ENCOUNTER — Ambulatory Visit (INDEPENDENT_AMBULATORY_CARE_PROVIDER_SITE_OTHER): Payer: Medicare Other | Admitting: Family Medicine

## 2015-09-14 ENCOUNTER — Encounter: Payer: Self-pay | Admitting: Family Medicine

## 2015-09-14 VITALS — BP 123/65 | HR 105 | Temp 98.3°F | Ht 68.0 in | Wt 171.8 lb

## 2015-09-14 DIAGNOSIS — M542 Cervicalgia: Secondary | ICD-10-CM

## 2015-09-14 MED ORDER — PREDNISONE 20 MG PO TABS: 40.0000 mg | ORAL_TABLET | Freq: Every day | ORAL | Status: DC

## 2015-09-14 NOTE — Progress Notes (Signed)
   HPI  Patient presents today here for ER follow-up after being seen for neck pain.  Patient explains that last Thursday he developed left-sided neck pain. He states that this started around 3 AM after he was sleeping differently on his pillow. The following day it seemed to get worse and then Saturday it was worse still. He then developed right-sided neck pain as well. He was seen in the emergency room on Sunday and had neck x-rays that showed advanced arthritis but no acute findings. He was given tramadol and Flexeril for pain.  He states that the tramadol was not really been helping, neither is the Flexeril. However his right-sided neck pain does seem to be improving. His left-sided neck pain is stable to possibly a little bit worse, he also has pain with swallowing, although he is not having any hard time swallowing, or any movements of his neck.  He denies fever, chills, sweats. He has been getting slightly nauseous with the tramadol.  Few weeks ago he was on prednisone for sciatic nerve pain which completely resolved his course.  PMH: Smoking status noted ROS: Per HPI  Objective: BP 123/65 mmHg  Pulse 105  Temp(Src) 98.3 F (36.8 C) (Oral)  Ht 5\' 8"  (1.727 m)  Wt 171 lb 12.8 oz (77.928 kg)  BMI 26.13 kg/m2 Gen: NAD, alert, cooperative with exam HEENT: NCAT, no conjunctival injection, left-sided neck pain with turning his head to the right with pressure against his chin in the area of the SCM Ext: No edema, warm Neuro: Alert and oriented, strength 5/5 and sensation intact in bilateral upper extremities, EOMI, tongue protrusion is midline, sensation intact in all branches of the trigeminal nerve  Musculoskeletal: Midline tenderness to palpation that is very mild, left-sided paraspinal muscle tenderness which is much more impressive. Limited range of motion in all directions of the neck  Assessment and plan:  # Neck pain Appears to be musculoskeletal neck pain, however  given the very low threshold for seeking emergent medical care. Develops fever or illness. He's had bad reactions to opiates in the past and would not wire to have these. Trial of prednisone, continue Flexeril, maximize Ultram to 100 mg cautiously Follow-up as needed, I have recommended that he call his orthopedic surgeon who has him scheduled for epidural injections of the lumbar spine neck early next week    Meds ordered this encounter  Medications  . predniSONE (DELTASONE) 20 MG tablet    Sig: Take 2 tablets (40 mg total) by mouth daily with breakfast.    Dispense:  14 tablet    Refill:  0    Laroy Apple, MD [facility] 09/14/2015, 1:13 PM

## 2015-09-14 NOTE — Patient Instructions (Signed)
Great to see you!  Try the prednisone Continue flexeril as needed but be careful as this can make you sleepy.   2 pills of tramadol is also ok to try.   If you have any arm weakness, confusion, fever, chills, sweats, or worsening of your pain please go to the emergency room.

## 2015-09-22 DIAGNOSIS — M5136 Other intervertebral disc degeneration, lumbar region: Secondary | ICD-10-CM | POA: Diagnosis not present

## 2015-09-23 ENCOUNTER — Ambulatory Visit (INDEPENDENT_AMBULATORY_CARE_PROVIDER_SITE_OTHER): Payer: Medicare Other | Admitting: Neurology

## 2015-09-23 ENCOUNTER — Encounter: Payer: Self-pay | Admitting: Neurology

## 2015-09-23 VITALS — BP 116/59 | HR 86 | Ht 68.5 in | Wt 165.0 lb

## 2015-09-23 DIAGNOSIS — G20 Parkinson's disease: Secondary | ICD-10-CM

## 2015-09-23 MED ORDER — CARBIDOPA-LEVODOPA 25-100 MG PO TABS: 1.0000 | ORAL_TABLET | Freq: Three times a day (TID) | ORAL | Status: DC

## 2015-09-23 MED ORDER — RASAGILINE MESYLATE 1 MG PO TABS: 1.0000 mg | ORAL_TABLET | Freq: Every day | ORAL | Status: DC

## 2015-09-23 NOTE — Progress Notes (Signed)
Reason for visit: Parkinson's disease  Keith Hill is an 82 y.o. male  History of present illness:  Keith Hill is an 82 year old right-handed white male with a history of Parkinson's disease. He is remaining active, playing golf regularly. He has had very minimal progression in his underlying abilities with the Parkinson's since last seen 6 months ago. He is on Azilect and low-dose Sinemet taking the 25/100 mg tablets 3 times daily. He is tolerating the medication well. He is sleeping well at night. He does have some low back pain issues, he recently got an epidural steroid injection in the back which was helpful. The has had one fall since last seen, he fell inside the house. He did not sustain significant injury. He reports tremors that mainly affect the right arm, made worse in the morning if he did not eat a lot for his dinner the day before. Once he eats, the tremor seems to improve. He returns for an evaluation. He denies any difficulty with vivid dreams at night, no problems with swallowing, no problems with memory.  Past Medical History  Diagnosis Date  . Pancreatitis 1976  . Lumbar disc disease   . Hyperlipidemia   . Hyperplasia of prostate   . Cardiac dysrhythmia, unspecified   . Allergic rhinitis, cause unspecified   . Colon polyps   . Hemorrhoids, external   . Hiatal hernia   . GERD (gastroesophageal reflux disease)   . Hypertension   . Essential and other specified forms of tremor   . Paralysis agitans ([place]) 11/07/2012  . Prostate cancer ([place])   . Parkinson disease ([place])     dx 4-5 months ago  . Peyronie's disease     Past Surgical History  Procedure Laterality Date  . Knee surgery    . Shoulder surgery Left   . Hemorrhoid surgery    . Prostate biopsy  04/03/2013  . Prostate biopsy  11/01/2011    Family History  Problem Relation Age of Onset  . Stroke Father   . Kidney failure Father   . Alcoholism Brother   . Heart attack Brother   . Cancer Neg Hx      Social history:  reports that he has never smoked. He has never used smokeless tobacco. He reports that he does not drink alcohol or use illicit drugs.    Allergies  Allergen Reactions  . Aspirin     Upsets stomach  . Aspirin Free Childs [Acetaminophen]   . Calicum Glycerophosphate   . Niaspan [Niacin Er]   . Prednisone   . Zocor [Simvastatin]     Medications:  Prior to Admission medications   Medication Sig Start Date End Date Taking? Authorizing Provider  amLODipine (NORVASC) 10 MG tablet TAKE (1/2) TABLET DAILY. 08/09/15  Yes Chipper Herb, MD  atorvastatin (LIPITOR) 40 MG tablet TAKE ONE TABLET AT BEDTIME Patient taking differently: Take 20 mg 4 days a week and take 10 mg 4 days a week. 07/19/15  Yes Chipper Herb, MD  carbidopa-levodopa (SINEMET IR) 25-100 MG per tablet Take 1 tablet by mouth 3 (three) times daily. 09/15/14  Yes Kathrynn Ducking, MD  Cholecalciferol (VITAMIN D3) 5000 UNITS CAPS Take 1 capsule by mouth 4 (four) times a week. Reported on 09/14/2015   Yes Historical Provider, MD  docusate sodium (COLACE) 100 MG capsule Take 100 mg by mouth 2 (two) times daily. daily   Yes Historical Provider, MD  FOLGARD RX 2.2-25-1 MG TABS TAKE 1 TABLET  DAILY 09/06/15  Yes Chipper Herb, MD  Krill Oil 300 MG CAPS Take by mouth daily. Reported on 09/14/2015   Yes Historical Provider, MD  Probiotic Product ([place]) Take by mouth 1 dose over 24 hours.     Yes Historical Provider, MD  rasagiline (AZILECT) 1 MG TABS tablet Take 1 tablet (1 mg total) by mouth daily. 09/15/14  Yes Kathrynn Ducking, MD  terazosin (HYTRIN) 2 MG capsule TAKE (1) CAPSULE DAILY AT BEDTIME. 06/28/15  Yes Chipper Herb, MD  traMADol (ULTRAM) 50 MG tablet Take 1 tablet (50 mg total) by mouth every 6 (six) hours as needed. 09/12/15  Yes Nat Christen, MD  valsartan-hydrochlorothiazide (DIOVAN-HCT) 320-25 MG per tablet TAKE 1 TABLET ONCE A DAY 10/20/14  Yes Chipper Herb, MD    ROS:  Out of a  complete 14 system review of symptoms, the patient complains only of the following symptoms, and all other reviewed systems are negative.  Tremor  Blood pressure 116/59, pulse 86, height 5' 8.5" (1.74 m), weight 165 lb (74.844 kg).  Physical Exam  General: The patient is alert and cooperative at the time of the examination.  Skin: No significant peripheral edema is noted.   Neurologic Exam  Mental status: The patient is alert and oriented x 3 at the time of the examination. The patient has apparent normal recent and remote memory, with an apparently normal attention span and concentration ability.   Cranial nerves: Facial symmetry is present. Speech is normal, no aphasia or dysarthria is noted. Extraocular movements are full, with exception of some restriction of superior gaze. Visual fields are full. Mild masking of the face is seen.  Motor: The patient has good strength in all 4 extremities.  Sensory examination: Soft touch sensation is symmetric on the face, arms, and legs.  Coordination: The patient has good finger-nose-finger and heel-to-shin bilaterally.  Gait and station: The patient has a normal gait. The patient is able to stand from a seated position with arms crossed. Once up, the patient has good stride, prominent tremor with the right upper extremity with walking, decreased arm swing bilaterally, stooped posture. Tandem gait is normal. Romberg is negative. No drift is seen.  Reflexes: Deep tendon reflexes are symmetric.   Assessment/Plan:  1. Parkinson's disease  The patient is doing quite well at this time. We will continue the Sinemet as is with the current dose, perceptions for Sinemet and for the Azilect were called in. He will follow-up in 6 months, sooner if needed. He has been progressing very slowly, he has remained active.  Jill Alexanders MD 09/23/2015 10:53 AM  [facility] [address] [place], [place]  [address]  Phone [phone] Fax [phone]

## 2015-09-23 NOTE — Patient Instructions (Signed)
Parkinson Disease Parkinson disease is a disorder of the central nervous system, which includes the brain and spinal cord. A person with this disease slowly loses the ability to completely control body movements. Within the brain, there is a group of nerve cells (basal ganglia) that help control movement. The basal ganglia are damaged and do not work properly in a person with Parkinson disease. In addition, the basal ganglia produce and use a brain chemical called dopamine. The dopamine chemical sends messages to other parts of the body to control and coordinate body movements. Dopamine levels are low in a person with Parkinson disease. If the dopamine levels are low, then the body does not receive the correct messages it needs to move normally.  CAUSES  The exact reason why the basal ganglia get damaged is not known. Some medical researchers have thought that infection, genes, environment, and certain medicines may contribute to the cause.  SYMPTOMS   An early symptom of Parkinson disease is often an uncontrolled shaking (tremor) of the hands. The tremor will often disappear when the affected hand is consciously used.  As the disease progresses, walking, talking, getting out of a chair, and new movements become more difficult.  Muscles get stiff and movements become slower.  Balance and coordination become harder.  Depression, trouble swallowing, urinary problems, constipation, and sleep problems can occur.  Later in the disease, memory and thought processes may deteriorate. DIAGNOSIS  There are no specific tests to diagnose Parkinson disease. You may be referred to a neurologist for evaluation. Your caregiver will ask about your medical history, symptoms, and perform a physical exam. Blood tests and imaging tests of your brain may be performed to rule out other diseases. The imaging tests may include an MRI or a CT scan. TREATMENT  The goal of treatment is to relieve symptoms. Medicines may be  prescribed once the symptoms become troublesome. Medicine will not stop the progression of the disease, but medicine can make movement and balance better and help control tremors. Speech and occupational therapy may also be prescribed. Sometimes, surgical treatment of the brain can be done in young people. HOME CARE INSTRUCTIONS  Get regular exercise and rest periods during the day to help prevent exhaustion and depression.  If getting dressed becomes difficult, replace buttons and zippers with Velcro and elastic on your clothing.  Take all medicine as directed by your caregiver.  Install grab bars or railings in your home to prevent falls.  Go to speech or occupational therapy as directed.  Keep all follow-up visits as directed by your caregiver. SEEK MEDICAL CARE IF:  Your symptoms are not controlled with your medicine.  You fall.  You have trouble swallowing or choke on your food. MAKE SURE YOU:  Understand these instructions.  Will watch your condition.  Will get help right away if you are not doing well or get worse.   This information is not intended to replace advice given to you by your health care provider. Make sure you discuss any questions you have with your health care provider.   Document Released: 08/04/2000 Document Revised: 12/02/2012 Document Reviewed: 09/06/2011 Elsevier Interactive Patient Education 2016 [employer].  

## 2015-09-30 ENCOUNTER — Telehealth: Payer: Self-pay | Admitting: Family Medicine

## 2015-09-30 ENCOUNTER — Other Ambulatory Visit (INDEPENDENT_AMBULATORY_CARE_PROVIDER_SITE_OTHER): Payer: Medicare Other

## 2015-09-30 DIAGNOSIS — Z8546 Personal history of malignant neoplasm of prostate: Secondary | ICD-10-CM

## 2015-09-30 DIAGNOSIS — G20 Parkinson's disease: Secondary | ICD-10-CM

## 2015-09-30 NOTE — Telephone Encounter (Signed)
Please refer patient to Dr. Carles Collet because of his Parkinson's disease

## 2015-10-01 LAB — PSA, TOTAL AND FREE
PROSTATE SPECIFIC AG, SERUM: 4.7 ng/mL — AB (ref 0.0–4.0)
PSA FREE PCT: 15.7 %
PSA FREE: 0.74 ng/mL

## 2015-10-05 ENCOUNTER — Encounter: Payer: Self-pay | Admitting: Neurology

## 2015-10-05 ENCOUNTER — Ambulatory Visit (INDEPENDENT_AMBULATORY_CARE_PROVIDER_SITE_OTHER): Payer: Medicare Other | Admitting: Neurology

## 2015-10-05 VITALS — BP 142/60 | HR 80 | Ht 68.5 in | Wt 166.0 lb

## 2015-10-05 DIAGNOSIS — G20A1 Parkinson's disease without dyskinesia, without mention of fluctuations: Secondary | ICD-10-CM

## 2015-10-05 DIAGNOSIS — G20 Parkinson's disease: Secondary | ICD-10-CM

## 2015-10-05 MED ORDER — CARBIDOPA-LEVODOPA ER 50-200 MG PO TBCR: 1.0000 | EXTENDED_RELEASE_TABLET | Freq: Every day | ORAL | Status: DC

## 2015-10-05 NOTE — Patient Instructions (Signed)
1. Start Carbidopa Levodopa 50/200 one tablet at night. Prescription sent to your pharmacy.

## 2015-10-05 NOTE — Progress Notes (Signed)
Keith Hill was seen today in the movement disorders clinic for neurologic consultation at the request of Redge Gainer, MD.    The patient presents today for neurologic consultation regarding his Parkinson's disease.  I have reviewed prior records made available to me.  The patient has been seeing Dr. Jannifer Franklin since 2014.  He also had a second opinion at [facility], although I do not have that and that information is not available through care everywhere.  The patient reports that his first symptom was tremor in the right hand and he was placed on Topamax and propranolol for the tremor and told he had essential tremor.  Pt states that on his second visit with Dr. Jannifer Franklin, he was started on carbidopa/levodopa 25/100 one half tablet 3 times per day (10/2012).  He thought that it was helpful.  Azilect was subsequently started in August, 2014 and he was told to try Benadryl for the tremor as well.  Pt states that he was told that azilect was neuroprotective.   He went to [facility] at the end of 2014 for a second opinion and he saw Kyra Searles, at which point his carbidopa/levodopa 25/100 was increased to a full tablet 3 times per day, which is the dosage that he remains on, although he often takes 1.5 tablets first thing in the AM now as he has most trouble in the AM after being "off" all night.  He remains on Azilect, 1 mg daily as well.   Specific Symptoms:  Tremor: Yes.   (right hand mostly, but some in left hand; none in legs) Voice: no change per pt Sleep: sleeps well with 1/2 of unisom at night  Vivid Dreams:  No.  Acting out dreams:  Yes.   (wife states that about one time every 2 weeks pt will hit her in sleep) Wet Pillows: No. Postural symptoms:  Yes.   but minimally so and has issues in the dark  Falls?  Yes.  , only one time recently and didn't get hurt Bradykinesia symptoms: no bradykinesia noted Loss of smell:  Yes.   (states that he hasn't smelled since an infection  since 82 years old) Loss of taste:  No. Urinary Incontinence:  No. Difficulty Swallowing:  No. Handwriting, micrographia: No. but was never legible (r hand dominant) Trouble with ADL's:  No.  Trouble buttoning clothing: No. Depression:  No. Memory changes:  No. Hallucinations:  No.  visual distortions: No. N/V:  No. Lightheaded:  No.  Syncope: No. Diplopia:  No. Dyskinesia:  No.  Neuroimaging has previously been performed.    PREVIOUS MEDICATIONS: Sinemet and azilect  ALLERGIES:   Allergies  Allergen Reactions  . Aspirin     Upsets stomach  . Calicum Glycerophosphate   . Niaspan [Niacin Er]   . Zocor [Simvastatin]     CURRENT MEDICATIONS:  Outpatient Encounter Prescriptions as of 10/05/2015  Medication Sig  . amLODipine (NORVASC) 10 MG tablet TAKE (1/2) TABLET DAILY.  Marland Kitchen atorvastatin (LIPITOR) 40 MG tablet TAKE ONE TABLET AT BEDTIME (Patient taking differently: Take 20 mg 4 days a week and take 10 mg 4 days a week.)  . carbidopa-levodopa (SINEMET IR) 25-100 MG tablet Take 1 tablet by mouth 3 (three) times daily.  . Cholecalciferol (VITAMIN D3) 5000 UNITS CAPS Take 1 capsule by mouth 4 (four) times a week. Reported on 09/14/2015  . docusate sodium (COLACE) 100 MG capsule Take 100 mg by mouth 2 (two) times daily. daily  .  FOLGARD RX 2.2-25-1 MG TABS TAKE 1 TABLET DAILY  . Krill Oil 300 MG CAPS Take by mouth daily. Reported on 09/14/2015  . Probiotic Product ([facility] COLON HEALTH PO) Take by mouth 1 dose over 24 hours.    . rasagiline (AZILECT) 1 MG TABS tablet Take 1 tablet (1 mg total) by mouth daily.  Marland Kitchen terazosin (HYTRIN) 2 MG capsule TAKE (1) CAPSULE DAILY AT BEDTIME.  . valsartan-hydrochlorothiazide (DIOVAN-HCT) 320-25 MG per tablet TAKE 1 TABLET ONCE A DAY  . [DISCONTINUED] traMADol (ULTRAM) 50 MG tablet Take 1 tablet (50 mg total) by mouth every 6 (six) hours as needed.   No facility-administered encounter medications on file as of 10/05/2015.    PAST MEDICAL  HISTORY:   Past Medical History  Diagnosis Date  . Pancreatitis 1976  . Lumbar disc disease   . Hyperlipidemia   . Hyperplasia of prostate   . Cardiac dysrhythmia, unspecified   . Allergic rhinitis, cause unspecified   . Colon polyps   . Hemorrhoids, external   . Hiatal hernia   . GERD (gastroesophageal reflux disease)   . Hypertension   . Essential and other specified forms of tremor   . Paralysis agitans ([place]) 11/07/2012  . Prostate cancer ([place])   . Parkinson disease ([place])     dx 4-5 months ago  . Peyronie's disease     PAST SURGICAL HISTORY:   Past Surgical History  Procedure Laterality Date  . Knee surgery Left   . Shoulder surgery Left   . Hemorrhoid surgery    . Prostate biopsy  04/03/2013  . Prostate biopsy  11/01/2011    SOCIAL HISTORY:   Social History   Social History  . Marital Status: Married    Spouse Name: [name]  . Number of Children: 1  . Years of Education: College   Occupational History  . retired     Diplomatic Services operational officer  .     Social History Main Topics  . Smoking status: Never Smoker   . Smokeless tobacco: Never Used  . Alcohol Use: No  . Drug Use: No  . Sexual Activity: Yes   Other Topics Concern  . Not on file   Social History Narrative   Patient lives at home with spouse.   Caffeine Use: 2-3 sodas daily   Patient is right handed.    FAMILY HISTORY:   Family Status  Relation Status Death Age  . Father Deceased 65    kidney failure  . Brother Deceased 23's  . Brother Deceased     73"s  . Mother Deceased 53    Etiology unknown  . Sister Deceased   . Sister Deceased 64  . Son Alive     ROS:  A complete 10 system review of systems was obtained and was unremarkable apart from what is mentioned above.  PHYSICAL EXAMINATION:    VITALS:   Filed Vitals:   10/05/15 0912  BP: 142/60  Pulse: 80  Height: 5' 8.5" (1.74 m)  Weight: 166 lb (75.297 kg)    GEN:  The patient appears stated age and is in NAD. HEENT:  Normocephalic,  atraumatic.  The mucous membranes are moist. The superficial temporal arteries are without ropiness or tenderness. CV:  RRR Lungs:  CTAB Neck/HEME:  There are no carotid bruits bilaterally.  Neurological examination:  Orientation: The patient is alert and oriented x3. Fund of knowledge is appropriate.  Recent and remote memory are intact.  Attention and concentration are normal.  Able to name objects and repeat phrases. Cranial nerves: There is good facial symmetry. Pupils are equal round and reactive to light bilaterally. Fundoscopic exam reveals clear margins bilaterally. Extraocular muscles are intact. The visual fields are full to confrontational testing. The speech is fluent and clear. Soft palate rises symmetrically and there is no tongue deviation. Hearing is intact to conversational tone. Sensation: Sensation is intact to light and pinprick throughout (facial, trunk, extremities). Vibration is intact at the bilateral big toe. There is no extinction with double simultaneous stimulation. There is no sensory dermatomal level identified. Motor: Strength is 5/5 in the bilateral upper and lower extremities.   Shoulder shrug is equal and symmetric.  There is no pronator drift. Deep tendon reflexes: Deep tendon reflexes are 2/4 at the bilateral biceps, triceps, brachioradialis, patella and achilles. Plantar responses are downgoing bilaterally.  Movement examination: Tone: There is normal tone in the bilateral upper extremities.  The tone in the lower extremities is normal.  Abnormal movements: There is rare tremor in the RUE that increases with ambulation and with distraction procedures.   Coordination:  There is rare decremation with RAM's, seen only with alternation of supination/pronation of forearm on the right.  All other RAMs are normal Gait and Station: The patient has no difficulty arising out of a deep-seated chair without the use of the hands. The patient's stride length is normal with  good arm swing.  The patient initially has a positive pull test, but after repeating it a second time, it was negative.  ASSESSMENT/PLAN:  1.  Idiopathic Parkinson's disease, diagnosed in 2014.  -He currently looks well treated.  His biggest complaint is difficulty in the morning after being off of medication on the right.  I would recommend adding carbidopa/levodopa 50/200 at bedtime.  He has nocturia times 3 at night, so it may facilitate him walking in the middle of the night as well.  He also has some cramping during the night, so this could help that.  He could continue the carbidopa/levodopa 25/100, 1-1-1/2 tablets in the morning, followed by one other tablet at noon and 6 PM.  -He would continue the Azilect, 1 mg daily.  We talked about the Azilect.  I told him that I do not believe it is neuroprotective, but it can help tremor.  -We talked about the importance of safe, cardiovascular exercise.  I would recommend that he try to increase his biking.  He really exercises a lot, but perhaps the intensity is not enough.  He and I talked about this today. 2.  Much greater than 50% of this visit was spent in counseling with the patient and the family.  Total face to face time:  65 min 3.  He will continue his neurologic care with Dr. Jannifer Franklin as this was just a second opinion

## 2015-10-07 DIAGNOSIS — N402 Nodular prostate without lower urinary tract symptoms: Secondary | ICD-10-CM | POA: Diagnosis not present

## 2015-10-07 DIAGNOSIS — Z0000 Encounter for general adult medical examination without abnormal findings: Secondary | ICD-10-CM | POA: Diagnosis not present

## 2015-10-07 DIAGNOSIS — R972 Elevated prostate specific antigen [PSA]: Secondary | ICD-10-CM | POA: Diagnosis not present

## 2015-10-07 DIAGNOSIS — C61 Malignant neoplasm of prostate: Secondary | ICD-10-CM | POA: Diagnosis not present

## 2015-10-11 ENCOUNTER — Other Ambulatory Visit: Payer: Self-pay | Admitting: Family Medicine

## 2015-11-04 ENCOUNTER — Other Ambulatory Visit: Payer: Self-pay | Admitting: Cardiology

## 2015-11-04 ENCOUNTER — Ambulatory Visit
Admission: RE | Admit: 2015-11-04 | Discharge: 2015-11-04 | Disposition: A | Discharge status: Home / Self Care | Payer: Medicare Other | Source: Ambulatory Visit | Attending: Cardiology | Admitting: Cardiology

## 2015-11-04 DIAGNOSIS — E785 Hyperlipidemia, unspecified: Secondary | ICD-10-CM | POA: Diagnosis not present

## 2015-11-04 DIAGNOSIS — I10 Essential (primary) hypertension: Secondary | ICD-10-CM | POA: Diagnosis not present

## 2015-11-04 DIAGNOSIS — G20 Parkinson's disease: Secondary | ICD-10-CM | POA: Diagnosis not present

## 2015-11-04 DIAGNOSIS — R0602 Shortness of breath: Secondary | ICD-10-CM

## 2015-11-04 DIAGNOSIS — I2510 Atherosclerotic heart disease of native coronary artery without angina pectoris: Secondary | ICD-10-CM | POA: Diagnosis not present

## 2015-11-08 DIAGNOSIS — R079 Chest pain, unspecified: Secondary | ICD-10-CM | POA: Diagnosis not present

## 2015-11-18 DIAGNOSIS — I2510 Atherosclerotic heart disease of native coronary artery without angina pectoris: Secondary | ICD-10-CM | POA: Diagnosis not present

## 2015-11-18 DIAGNOSIS — I10 Essential (primary) hypertension: Secondary | ICD-10-CM | POA: Diagnosis not present

## 2015-11-18 DIAGNOSIS — G20 Parkinson's disease: Secondary | ICD-10-CM | POA: Diagnosis not present

## 2015-11-18 DIAGNOSIS — R0602 Shortness of breath: Secondary | ICD-10-CM | POA: Diagnosis not present

## 2015-11-18 DIAGNOSIS — E785 Hyperlipidemia, unspecified: Secondary | ICD-10-CM | POA: Diagnosis not present

## 2015-11-18 DIAGNOSIS — R079 Chest pain, unspecified: Secondary | ICD-10-CM | POA: Diagnosis not present

## 2015-11-22 ENCOUNTER — Other Ambulatory Visit: Payer: Self-pay | Admitting: Family Medicine

## 2015-11-22 DIAGNOSIS — R972 Elevated prostate specific antigen [PSA]: Secondary | ICD-10-CM | POA: Diagnosis not present

## 2015-11-22 DIAGNOSIS — C61 Malignant neoplasm of prostate: Secondary | ICD-10-CM | POA: Diagnosis not present

## 2015-12-20 ENCOUNTER — Other Ambulatory Visit: Payer: Medicare Other

## 2015-12-20 DIAGNOSIS — E785 Hyperlipidemia, unspecified: Secondary | ICD-10-CM | POA: Diagnosis not present

## 2015-12-20 DIAGNOSIS — E559 Vitamin D deficiency, unspecified: Secondary | ICD-10-CM

## 2015-12-20 DIAGNOSIS — I10 Essential (primary) hypertension: Secondary | ICD-10-CM

## 2015-12-20 DIAGNOSIS — C61 Malignant neoplasm of prostate: Secondary | ICD-10-CM | POA: Diagnosis not present

## 2015-12-20 DIAGNOSIS — I2510 Atherosclerotic heart disease of native coronary artery without angina pectoris: Secondary | ICD-10-CM

## 2015-12-20 DIAGNOSIS — I2583 Coronary atherosclerosis due to lipid rich plaque: Secondary | ICD-10-CM

## 2015-12-21 LAB — CBC WITH DIFFERENTIAL/PLATELET
BASOS: 1 %
Basophils Absolute: 0.1 10*3/uL (ref 0.0–0.2)
EOS (ABSOLUTE): 0.4 10*3/uL (ref 0.0–0.4)
EOS: 5 %
HEMATOCRIT: 39.2 % (ref 37.5–51.0)
HEMOGLOBIN: 13.1 g/dL (ref 12.6–17.7)
Immature Grans (Abs): 0 10*3/uL (ref 0.0–0.1)
Immature Granulocytes: 0 %
LYMPHS ABS: 1.6 10*3/uL (ref 0.7–3.1)
Lymphs: 21 %
MCH: 33.2 pg — AB (ref 26.6–33.0)
MCHC: 33.4 g/dL (ref 31.5–35.7)
MCV: 99 fL — AB (ref 79–97)
MONOCYTES: 10 %
MONOS ABS: 0.8 10*3/uL (ref 0.1–0.9)
NEUTROS ABS: 4.9 10*3/uL (ref 1.4–7.0)
Neutrophils: 63 %
Platelets: 268 10*3/uL (ref 150–379)
RBC: 3.95 x10E6/uL — AB (ref 4.14–5.80)
RDW: 13.1 % (ref 12.3–15.4)
WBC: 7.7 10*3/uL (ref 3.4–10.8)

## 2015-12-21 LAB — BMP8+EGFR
BUN/Creatinine Ratio: 20 (ref 10–24)
BUN: 22 mg/dL (ref 8–27)
CO2: 23 mmol/L (ref 18–29)
Calcium: 9.4 mg/dL (ref 8.6–10.2)
Chloride: 102 mmol/L (ref 96–106)
Creatinine, Ser: 1.11 mg/dL (ref 0.76–1.27)
GFR, EST AFRICAN AMERICAN: 71 mL/min/{1.73_m2} (ref >59)
GFR, EST NON AFRICAN AMERICAN: 62 mL/min/{1.73_m2} (ref >59)
Glucose: 94 mg/dL (ref 65–99)
POTASSIUM: 4.1 mmol/L (ref 3.5–5.2)
SODIUM: 143 mmol/L (ref 134–144)

## 2015-12-21 LAB — HEPATIC FUNCTION PANEL
ALBUMIN: 4.1 g/dL (ref 3.5–4.7)
ALK PHOS: 76 IU/L (ref 39–117)
ALT: 14 IU/L (ref 0–44)
AST: 26 IU/L (ref 0–40)
Bilirubin Total: 0.5 mg/dL (ref 0.0–1.2)
Bilirubin, Direct: 0.16 mg/dL (ref 0.00–0.40)
Total Protein: 6.7 g/dL (ref 6.0–8.5)

## 2015-12-21 LAB — NMR, LIPOPROFILE
Cholesterol: 136 mg/dL (ref 100–199)
HDL CHOLESTEROL BY NMR: 55 mg/dL (ref >39)
HDL PARTICLE NUMBER: 33.5 umol/L (ref >=30.5)
LDL PARTICLE NUMBER: 717 nmol/L (ref <1000)
LDL Size: 21 nm (ref >20.5)
LDL-C: 66 mg/dL (ref 0–99)
LP-IR SCORE: 33 (ref <=45)
Small LDL Particle Number: 234 nmol/L (ref <=527)
Triglycerides by NMR: 74 mg/dL (ref 0–149)

## 2015-12-21 LAB — VITAMIN D 25 HYDROXY (VIT D DEFICIENCY, FRACTURES): VIT D 25 HYDROXY: 43.4 ng/mL (ref 30.0–100.0)

## 2015-12-27 ENCOUNTER — Encounter: Payer: Self-pay | Admitting: Family Medicine

## 2015-12-27 ENCOUNTER — Encounter (INDEPENDENT_AMBULATORY_CARE_PROVIDER_SITE_OTHER): Payer: Self-pay

## 2015-12-27 ENCOUNTER — Ambulatory Visit (INDEPENDENT_AMBULATORY_CARE_PROVIDER_SITE_OTHER): Payer: Medicare Other | Admitting: Family Medicine

## 2015-12-27 VITALS — BP 118/61 | HR 77 | Temp 97.6°F | Ht 68.5 in | Wt 167.0 lb

## 2015-12-27 DIAGNOSIS — E785 Hyperlipidemia, unspecified: Secondary | ICD-10-CM

## 2015-12-27 DIAGNOSIS — I2510 Atherosclerotic heart disease of native coronary artery without angina pectoris: Secondary | ICD-10-CM | POA: Diagnosis not present

## 2015-12-27 DIAGNOSIS — G20 Parkinson's disease: Secondary | ICD-10-CM | POA: Diagnosis not present

## 2015-12-27 DIAGNOSIS — E559 Vitamin D deficiency, unspecified: Secondary | ICD-10-CM | POA: Diagnosis not present

## 2015-12-27 DIAGNOSIS — I10 Essential (primary) hypertension: Secondary | ICD-10-CM | POA: Diagnosis not present

## 2015-12-27 DIAGNOSIS — C61 Malignant neoplasm of prostate: Secondary | ICD-10-CM

## 2015-12-27 NOTE — Progress Notes (Signed)
Subjective:    Patient ID: Keith Hill, male    DOB: Nov 05, 1931, 82 y.o.   MRN: [id number]  HPI Pt here for follow up and management of chronic medical problems which includes hypertension and hyperlipidemia. He is taking medications regularly.The patient continues to have ongoing low back pain. He recently had an MRI and an injection. He continues to see the cardiologist on a regular basis. He sees Dr. Jannifer Franklin for his Parkinson's and Dr. Jeffie Pollock for his history of prostate cancer.The patient has recently had injections by Dr. Nelva Bush. He plans to go back for further injections. He sees the neurologist every 6 months. He recently had a stress test from the cardiologist and everything was good according to the patient. He is also followed by his urologist because of prostate cancer and mentions that the PSA is only going up slightly. He has a follow-up appointment with him. He denies any chest pain. He did have some shortness of breath at nighttime which he now attributes to allergies which seems to be getting better. He is taking Claritin over-the-counter for this. He denies any trouble swallowing with heartburn indigestion nausea vomiting diarrhea or blood in the stool. He is passing his water without problems and as mentioned earlier will be seeing the urologist again for prostate cancer follow-up.    Patient Active Problem List   Diagnosis Date Noted  . Back pain 11/18/2013  . Vitamin D deficiency 08/28/2013  . Hiatal hernia with gastroesophageal reflux 08/28/2013  . CAD (coronary artery disease) 04/08/2013  . Essential and other specified forms of tremor 03/31/2013  . Parkinson's 11/07/2012  . Hemorrhoids 02/17/2011  . Hyperlipidemia with target LDL less than 70   . Prostate cancer ([place])   . Hypertension   . Allergic rhinitis   . Colon polyps, history of    Outpatient Encounter Prescriptions as of 12/27/2015  Medication Sig  . amLODipine (NORVASC) 10 MG tablet TAKE (1/2) TABLET DAILY.  Marland Kitchen  atorvastatin (LIPITOR) 40 MG tablet TAKE ONE TABLET AT BEDTIME (Patient taking differently: Take 20 mg 4 days a week and take 10 mg 4 days a week.)  . carbidopa-levodopa (SINEMET IR) 25-100 MG tablet Take 1 tablet by mouth 3 (three) times daily.  . Cholecalciferol (VITAMIN D3) 5000 UNITS CAPS Take 1 capsule by mouth 4 (four) times a week. Reported on 09/14/2015  . docusate sodium (COLACE) 100 MG capsule Take 100 mg by mouth 2 (two) times daily. daily  . FOLGARD RX 2.2-25-1 MG TABS TAKE 1 TABLET DAILY  . Krill Oil 300 MG CAPS Take by mouth daily. Reported on 09/14/2015  . Probiotic Product (PHILLIPS COLON HEALTH PO) Take by mouth 1 dose over 24 hours.    . rasagiline (AZILECT) 1 MG TABS tablet Take 1 tablet (1 mg total) by mouth daily.  Marland Kitchen terazosin (HYTRIN) 2 MG capsule TAKE (1) CAPSULE DAILY AT BEDTIME.  . valsartan-hydrochlorothiazide (DIOVAN-HCT) 320-25 MG per tablet TAKE 1 TABLET ONCE A DAY  . [DISCONTINUED] carbidopa-levodopa (SINEMET CR) 50-200 MG tablet Take 1 tablet by mouth at bedtime.   No facility-administered encounter medications on file as of 12/27/2015.      Review of Systems  Constitutional: Negative.   HENT: Negative.   Eyes: Negative.   Respiratory: Negative.   Cardiovascular: Negative.   Gastrointestinal: Negative.   Endocrine: Negative.   Genitourinary: Negative.   Musculoskeletal: Positive for back pain (on-going low back).  Skin: Negative.   Allergic/Immunologic: Negative.   Neurological: Negative.   Hematological: Negative.  Psychiatric/Behavioral: Negative.        Objective:   Physical Exam  Constitutional: He is oriented to person, place, and time. He appears well-developed and well-nourished. No distress.  HENT:  Head: Normocephalic and atraumatic.  Right Ear: External ear normal.  Left Ear: External ear normal.  Nose: Nose normal.  Mouth/Throat: Oropharynx is clear and moist. No oropharyngeal exudate.  Eyes: Conjunctivae and EOM are normal. Pupils are  equal, round, and reactive to light. Right eye exhibits no discharge. Left eye exhibits no discharge. No scleral icterus.  Neck: Normal range of motion. Neck supple. No thyromegaly present.  Cardiovascular: Normal rate, regular rhythm, normal heart sounds and intact distal pulses.   No murmur heard. The heart had a regular rate and rhythm at 72/m  Pulmonary/Chest: Effort normal and breath sounds normal. No respiratory distress. He has no wheezes. He has no rales. He exhibits no tenderness.  Clear anteriorly and posteriorly  Abdominal: Soft. Bowel sounds are normal. He exhibits no mass. There is no tenderness. There is no rebound and no guarding.  No liver or spleen enlargement and no abdominal masses or tenderness  Genitourinary:  The patient sees the urologist regularly.  Musculoskeletal: Normal range of motion. He exhibits no edema or tenderness.  Lymphadenopathy:    He has no cervical adenopathy.  Neurological: He is alert and oriented to person, place, and time. He has normal reflexes. No cranial nerve deficit.  Minimal tremor noticed today  Skin: Skin is warm and dry. No rash noted.  Psychiatric: He has a normal mood and affect. His behavior is normal. Judgment and thought content normal.  The patient was alert and calm and responding appropriately to all questions.  Nursing note and vitals reviewed.   BP 118/61 mmHg  Pulse 77  Temp(Src) 97.6 F (36.4 C) (Oral)  Ht 5' 8.5" (1.74 m)  Wt 167 lb (75.751 kg)  BMI 25.02 kg/m2       Assessment & Plan:  1. Hypertension -The blood pressure is good today and he will continue with current treatment  2. Vitamin D deficiency -The vitamin D level was good on recent lab work and he will continue with current treatment  3. Hyperlipemia -All cholesterol numbers were excellent and he will continue with current treatment  4. Prostate cancer [facility]) -The patient continues to follow-up regularly with his urologist, Dr. Jeffie Pollock  5.  Parkinson's -He will follow-up regularly with his neurologist Dr. Jannifer Franklin  6. Hyperlipidemia with target LDL less than 70 -He will continue with aggressive therapeutic lifestyle changes and regular follow-ups with his cardiologist.  No orders of the defined types were placed in this encounter.   Patient Instructions                       Medicare Annual Wellness Visit  [facility] and the medical providers at [facility] strive to bring you the best medical care.  In doing so we not only want to address your current medical conditions and concerns but also to detect new conditions early and prevent illness, disease and health-related problems.    Medicare offers a yearly Wellness Visit which allows our clinical staff to assess your need for preventative services including immunizations, lifestyle education, counseling to decrease risk of preventable diseases and screening for fall risk and other medical concerns.    This visit is provided free of charge (no copay) for all Medicare recipients. The clinical pharmacists at [facility] have  begun to conduct these Wellness Visits which will also include a thorough review of all your medications.    As you primary medical provider recommend that you make an appointment for your Annual Wellness Visit if you have not done so already this year.  You may set up this appointment before you leave today or you may call back [phone] and schedule an appointment.  Please make sure when you call that you mention that you are scheduling your Annual Wellness Visit with the clinical pharmacist so that the appointment may be made for the proper length of time.     Continue current medications. Continue good therapeutic lifestyle changes which include good diet and exercise. Fall precautions discussed with patient. If an FOBT was given today- please return it to our front desk. If you are over 10 years old - you may  need Prevnar 61 or the adult Pneumonia vaccine.  **Flu shots are available--- please call and schedule a FLU-CLINIC appointment**  After your visit with [place] today you will receive a survey in the mail or online from [employer] regarding your care with [place]. Please take a moment to fill this out. Your feedback is very important to [place] as you can help [place] better understand your patient needs as well as improve your experience and satisfaction. WE CARE ABOUT YOU!!!   Continue to follow-up with cardiology, neurology, and urology. And orthopedist as planned Continue to avoid as much as possible the regular use of diphenhydramine as regular use of this has been associated with dementia as people get older Stay active physically and drink plenty of fluids and stay well hydrated   Arrie Senate MD

## 2015-12-27 NOTE — Patient Instructions (Addendum)
Medicare Annual Wellness Visit  [place] and the medical providers at [facility] strive to bring you the best medical care.  In doing so we not only want to address your current medical conditions and concerns but also to detect new conditions early and prevent illness, disease and health-related problems.    Medicare offers a yearly Wellness Visit which allows our clinical staff to assess your need for preventative services including immunizations, lifestyle education, counseling to decrease risk of preventable diseases and screening for fall risk and other medical concerns.    This visit is provided free of charge (no copay) for all Medicare recipients. The clinical pharmacists at [facility] have begun to conduct these Wellness Visits which will also include a thorough review of all your medications.    As you primary medical provider recommend that you make an appointment for your Annual Wellness Visit if you have not done so already this year.  You may set up this appointment before you leave today or you may call back [phone] and schedule an appointment.  Please make sure when you call that you mention that you are scheduling your Annual Wellness Visit with the clinical pharmacist so that the appointment may be made for the proper length of time.     Continue current medications. Continue good therapeutic lifestyle changes which include good diet and exercise. Fall precautions discussed with patient. If an FOBT was given today- please return it to our front desk. If you are over 25 years old - you may need Prevnar 38 or the adult Pneumonia vaccine.  **Flu shots are available--- please call and schedule a FLU-CLINIC appointment**  After your visit with [place] today you will receive a survey in the mail or online from [employer] regarding your care with [place]. Please take a moment to fill this out. Your feedback is very  important to [place] as you can help [place] better understand your patient needs as well as improve your experience and satisfaction. WE CARE ABOUT YOU!!!   Continue to follow-up with cardiology, neurology, and urology. And orthopedist as planned Continue to avoid as much as possible the regular use of diphenhydramine as regular use of this has been associated with dementia as people get older Stay active physically and drink plenty of fluids and stay well hydrated

## 2016-01-04 DIAGNOSIS — M5136 Other intervertebral disc degeneration, lumbar region: Secondary | ICD-10-CM | POA: Diagnosis not present

## 2016-01-19 ENCOUNTER — Ambulatory Visit (INDEPENDENT_AMBULATORY_CARE_PROVIDER_SITE_OTHER): Payer: Medicare Other | Admitting: Physician Assistant

## 2016-01-19 ENCOUNTER — Encounter: Payer: Self-pay | Admitting: Physician Assistant

## 2016-01-19 VITALS — BP 108/60 | HR 88 | Temp 98.4°F | Ht 68.5 in | Wt 164.2 lb

## 2016-01-19 DIAGNOSIS — J029 Acute pharyngitis, unspecified: Secondary | ICD-10-CM

## 2016-01-19 DIAGNOSIS — I2510 Atherosclerotic heart disease of native coronary artery without angina pectoris: Secondary | ICD-10-CM | POA: Diagnosis not present

## 2016-01-19 MED ORDER — AMOXICILLIN 875 MG PO TABS: 875.0000 mg | ORAL_TABLET | Freq: Two times a day (BID) | ORAL | Status: DC

## 2016-01-19 NOTE — Progress Notes (Signed)
Subjective:     Patient ID: Keith Hill, male   DOB: [birth year], 82 y.o.   MRN: [id number]  HPI Pt noted scratchy throat last evening This am noted pain and swelling to the R side of the throat He has been taking Claritin daily for the last month but no other meds for sx  Review of Systems  Constitutional: Negative.   HENT: Positive for congestion, postnasal drip and sore throat. Negative for ear discharge, ear pain, nosebleeds, rhinorrhea and sinus pressure.   Respiratory: Negative.   Cardiovascular: Negative.        Objective:   Physical Exam  Constitutional: He appears well-developed and well-nourished.  HENT:  Right Ear: External ear normal.  Left Ear: External ear normal.  Mouth/Throat: Oropharynx is clear and moist. No oropharyngeal exudate.  Edema noted to the R tonsil area  Neck: Neck supple.  R ant cerv node  Cardiovascular: Normal rate, regular rhythm and normal heart sounds.   Pulmonary/Chest: Effort normal and breath sounds normal.  Lymphadenopathy:    He has cervical adenopathy.  Nursing note and vitals reviewed.      Assessment:     1. Acute pharyngitis, unspecified etiology        Plan:     Amox 875mg  bid x 10 days Claritin OTC Fluids Rest F/U prn

## 2016-01-19 NOTE — Patient Instructions (Signed)

## 2016-01-27 ENCOUNTER — Ambulatory Visit (INDEPENDENT_AMBULATORY_CARE_PROVIDER_SITE_OTHER): Payer: Medicare Other | Admitting: Physician Assistant

## 2016-01-27 ENCOUNTER — Encounter: Payer: Self-pay | Admitting: Physician Assistant

## 2016-01-27 VITALS — BP 116/61 | HR 83 | Temp 96.8°F | Ht 68.5 in | Wt 166.2 lb

## 2016-01-27 DIAGNOSIS — I2510 Atherosclerotic heart disease of native coronary artery without angina pectoris: Secondary | ICD-10-CM

## 2016-01-27 DIAGNOSIS — R591 Generalized enlarged lymph nodes: Secondary | ICD-10-CM

## 2016-01-27 DIAGNOSIS — R599 Enlarged lymph nodes, unspecified: Secondary | ICD-10-CM | POA: Diagnosis not present

## 2016-01-27 MED ORDER — AZITHROMYCIN 250 MG PO TABS: ORAL_TABLET | ORAL | Status: DC

## 2016-01-27 NOTE — Progress Notes (Signed)
Subjective:     Patient ID: Keith Hill, male   DOB: 02-24-1932, 82 y.o.   MRN: [id number]  HPI Pt still with some swelling to the R ant neck area S/T has improved He has taken ~ 8 days of Amox  Review of Systems  Constitutional: Negative.   HENT: Positive for congestion, postnasal drip and voice change. Negative for ear discharge, ear pain, rhinorrhea, sinus pressure and sore throat.   Respiratory: Negative.   Cardiovascular: Negative.        Objective:   Physical Exam  Constitutional: He appears well-developed and well-nourished.  HENT:  Right Ear: External ear normal.  Left Ear: External ear normal.  Mouth/Throat: Oropharyngeal exudate present.  Neck: Neck supple.  R ant cerv nodes  Lymphadenopathy:    He has cervical adenopathy.  Nursing note and vitals reviewed.      Assessment:     1. Lymphadenopathy of head and neck        Plan:     Change ATB to Zpack Continue all other meds If sx continue f/u

## 2016-01-27 NOTE — Patient Instructions (Signed)

## 2016-01-31 ENCOUNTER — Other Ambulatory Visit: Payer: Self-pay | Admitting: Family Medicine

## 2016-03-09 ENCOUNTER — Other Ambulatory Visit: Payer: Medicare Other

## 2016-03-09 DIAGNOSIS — C61 Malignant neoplasm of prostate: Secondary | ICD-10-CM | POA: Diagnosis not present

## 2016-03-10 LAB — PSA, TOTAL AND FREE
PSA, Free Pct: 14.4 %
PSA, Free: 0.75 ng/mL
Prostate Specific Ag, Serum: 5.2 ng/mL — ABNORMAL HIGH (ref 0.0–4.0)

## 2016-03-16 DIAGNOSIS — N402 Nodular prostate without lower urinary tract symptoms: Secondary | ICD-10-CM | POA: Diagnosis not present

## 2016-03-16 DIAGNOSIS — R972 Elevated prostate specific antigen [PSA]: Secondary | ICD-10-CM | POA: Diagnosis not present

## 2016-03-16 DIAGNOSIS — C61 Malignant neoplasm of prostate: Secondary | ICD-10-CM | POA: Diagnosis not present

## 2016-03-16 DIAGNOSIS — R351 Nocturia: Secondary | ICD-10-CM | POA: Diagnosis not present

## 2016-03-23 ENCOUNTER — Telehealth: Payer: Self-pay | Admitting: Family Medicine

## 2016-03-23 NOTE — Telephone Encounter (Signed)
Called patient to see what labs he needs. He is not due for labs till September closer to his appointment with Laurance Flatten.

## 2016-03-28 ENCOUNTER — Encounter: Payer: Self-pay | Admitting: Neurology

## 2016-03-28 ENCOUNTER — Ambulatory Visit (INDEPENDENT_AMBULATORY_CARE_PROVIDER_SITE_OTHER): Payer: Medicare Other | Admitting: Neurology

## 2016-03-28 VITALS — BP 126/65 | HR 77 | Ht 68.5 in | Wt 167.0 lb

## 2016-03-28 DIAGNOSIS — G20 Parkinson's disease: Secondary | ICD-10-CM | POA: Diagnosis not present

## 2016-03-28 DIAGNOSIS — I2510 Atherosclerotic heart disease of native coronary artery without angina pectoris: Secondary | ICD-10-CM | POA: Diagnosis not present

## 2016-03-28 MED ORDER — CARBIDOPA-LEVODOPA 25-100 MG PO TABS: ORAL_TABLET | ORAL | 3 refills | Status: DC

## 2016-03-28 NOTE — Progress Notes (Signed)
Reason for visit: Parkinson's disease  Keith Hill is an 82 y.o. male  History of present illness:  Keith Hill is a 82 year old right-handed white male with a history of Parkinson's disease primarily with right-sided features. The patient has been on low-dose Sinemet taking 25/100 mg tablets 3 times daily and he has been on Azilect. The patient was seen by Dr. Carles Collet for a second opinion. The patient has been doing relatively well, he does not believe there has been much change in his functional level over the last one year. The patient has not had any problems with falling, he denies issues with swallowing. The patient has tremors involving the right upper extremity. He does note some sensation of imbalance when he tries to get to the bathroom at night. He feels like the medications will start working within 15 or 20 minutes after he takes them in the morning, he sometimes will take 1-1/2 tablets in the morning dose. The patient plays golf and works out on a regular basis. He does report some low back pain at times. He has gotten some epidural steroid injections in the back without benefit. He returns for an evaluation.  Past Medical History:  Diagnosis Date  . Allergic rhinitis, cause unspecified   . Cardiac dysrhythmia, unspecified   . Colon polyps   . Essential and other specified forms of tremor   . GERD (gastroesophageal reflux disease)   . Hemorrhoids, external   . Hiatal hernia   . Hyperlipidemia   . Hyperplasia of prostate   . Hypertension   . Lumbar disc disease   . Pancreatitis 1976  . Paralysis agitans ([place]) 11/07/2012  . Peyronie's disease   . Prostate cancer [facility])     Past Surgical History:  Procedure Laterality Date  . HEMORRHOID SURGERY    . KNEE SURGERY Left   . PROSTATE BIOPSY  04/03/2013  . PROSTATE BIOPSY  11/01/2011  . SHOULDER SURGERY Left     Family History  Problem Relation Age of Onset  . Stroke Father   . Kidney failure Father   . Alcoholism Brother    . Heart attack Brother   . Cancer Neg Hx     Social history:  reports that he has never smoked. He has never used smokeless tobacco. He reports that he does not drink alcohol or use drugs.    Allergies  Allergen Reactions  . Aspirin     Upsets stomach  . Calicum Glycerophosphate   . Niaspan [Niacin Er]   . Zocor [Simvastatin]     Medications:  Prior to Admission medications   Medication Sig Start Date End Date Taking? Authorizing Provider  amLODipine (NORVASC) 10 MG tablet TAKE (1/2) TABLET DAILY. 01/31/16  Yes Chipper Herb, MD  atorvastatin (LIPITOR) 40 MG tablet TAKE ONE TABLET AT BEDTIME 01/31/16  Yes Chipper Herb, MD  carbidopa-levodopa (SINEMET IR) 25-100 MG tablet Take 1 tablet by mouth 3 (three) times daily. 09/23/15  Yes Kathrynn Ducking, MD  Cholecalciferol (VITAMIN D3) 5000 UNITS CAPS Take 1 capsule by mouth 4 (four) times a week. Reported on 09/14/2015   Yes Historical Provider, MD  docusate sodium (COLACE) 100 MG capsule Take 100 mg by mouth 2 (two) times daily. daily   Yes Historical Provider, MD  FOLGARD RX 2.2-25-1 MG TABS TAKE 1 TABLET DAILY 09/06/15  Yes Chipper Herb, MD  Krill Oil 300 MG CAPS Take by mouth daily. Reported on 09/14/2015   Yes Historical Provider, MD  Probiotic Product (PHILLIPS COLON HEALTH PO) Take by mouth 1 dose over 24 hours.     Yes Historical Provider, MD  rasagiline (AZILECT) 1 MG TABS tablet Take 1 tablet (1 mg total) by mouth daily. 09/23/15  Yes Kathrynn Ducking, MD  terazosin (HYTRIN) 2 MG capsule TAKE (1) CAPSULE DAILY AT BEDTIME. 10/12/15  Yes Chipper Herb, MD  valsartan-hydrochlorothiazide (DIOVAN-HCT) 320-25 MG tablet TAKE 1 TABLET ONCE A DAY Patient taking differently: TAKE 1/4 TABLET ONCE A DAY 01/31/16  Yes Chipper Herb, MD    ROS:  Out of a complete 14 system review of symptoms, the patient complains only of the following symptoms, and all other reviewed systems are negative.  Runny nose Back pain, muscle  cramps Tremors  Blood pressure 126/65, pulse 77, height 5' 8.5" (1.74 m), weight 167 lb (75.8 kg).  Physical Exam  General: The patient is alert and cooperative at the time of the examination.  Skin: No significant peripheral edema is noted.   Neurologic Exam  Mental status: The patient is alert and oriented x 3 at the time of the examination. The patient has apparent normal recent and remote memory, with an apparently normal attention span and concentration ability.   Cranial nerves: Facial symmetry is present. Speech is normal, no aphasia or dysarthria is noted. Extraocular movements are full. Visual fields are full. Mild masking of the face is seen.  Motor: The patient has good strength in all 4 extremities.  Sensory examination: Soft touch sensation is symmetric on the face, arms, and legs.  Coordination: The patient has good finger-nose-finger and heel-to-shin bilaterally.  Gait and station: The patient is able to arise from a seated position with arms crossed. Once up, he is able to ambulate independently, slightly stooped posture, tremor involving the right arm is seen. Tandem gait is minimally unsteady. Romberg is negative. No drift is seen.  Reflexes: Deep tendon reflexes are symmetric.   Assessment/Plan:  1. Parkinson's disease  The patient is doing well at this time. He can increase his Sinemet to 1.5 tablets in the morning, 1 at midday and one in the evening. A prescription for the Sinemet was written. The patient will follow-up through this office in 5 months.  Jill Alexanders MD 03/28/2016 11:29 AM  [facility] [address] [place], [place] [address]  Phone [phone] Fax [phone]

## 2016-04-03 ENCOUNTER — Other Ambulatory Visit: Payer: Self-pay | Admitting: Family Medicine

## 2016-04-04 DIAGNOSIS — H2513 Age-related nuclear cataract, bilateral: Secondary | ICD-10-CM | POA: Diagnosis not present

## 2016-04-04 DIAGNOSIS — H538 Other visual disturbances: Secondary | ICD-10-CM | POA: Diagnosis not present

## 2016-04-18 DIAGNOSIS — I2510 Atherosclerotic heart disease of native coronary artery without angina pectoris: Secondary | ICD-10-CM | POA: Diagnosis not present

## 2016-04-18 DIAGNOSIS — G20 Parkinson's disease: Secondary | ICD-10-CM | POA: Diagnosis not present

## 2016-04-18 DIAGNOSIS — E785 Hyperlipidemia, unspecified: Secondary | ICD-10-CM | POA: Diagnosis not present

## 2016-04-18 DIAGNOSIS — R079 Chest pain, unspecified: Secondary | ICD-10-CM | POA: Diagnosis not present

## 2016-04-18 DIAGNOSIS — R0602 Shortness of breath: Secondary | ICD-10-CM | POA: Diagnosis not present

## 2016-04-18 DIAGNOSIS — I10 Essential (primary) hypertension: Secondary | ICD-10-CM | POA: Diagnosis not present

## 2016-04-25 ENCOUNTER — Other Ambulatory Visit: Payer: Medicare Other

## 2016-04-25 DIAGNOSIS — E559 Vitamin D deficiency, unspecified: Secondary | ICD-10-CM

## 2016-04-25 DIAGNOSIS — I10 Essential (primary) hypertension: Secondary | ICD-10-CM

## 2016-04-25 DIAGNOSIS — E785 Hyperlipidemia, unspecified: Secondary | ICD-10-CM | POA: Diagnosis not present

## 2016-04-25 DIAGNOSIS — G20 Parkinson's disease: Secondary | ICD-10-CM

## 2016-04-26 LAB — VITAMIN D 25 HYDROXY (VIT D DEFICIENCY, FRACTURES): Vit D, 25-Hydroxy: 45.6 ng/mL (ref 30.0–100.0)

## 2016-04-26 LAB — BMP8+EGFR
BUN/Creatinine Ratio: 18 (ref 10–24)
BUN: 25 mg/dL (ref 8–27)
CALCIUM: 9.4 mg/dL (ref 8.6–10.2)
CHLORIDE: 101 mmol/L (ref 96–106)
CO2: 25 mmol/L (ref 18–29)
CREATININE: 1.39 mg/dL — AB (ref 0.76–1.27)
GFR calc Af Amer: 54 mL/min/{1.73_m2} — ABNORMAL LOW (ref >59)
GFR calc non Af Amer: 47 mL/min/{1.73_m2} — ABNORMAL LOW (ref >59)
GLUCOSE: 94 mg/dL (ref 65–99)
Potassium: 4 mmol/L (ref 3.5–5.2)
Sodium: 141 mmol/L (ref 134–144)

## 2016-04-26 LAB — NMR, LIPOPROFILE
CHOLESTEROL: 127 mg/dL (ref 100–199)
HDL Cholesterol by NMR: 56 mg/dL (ref >39)
HDL PARTICLE NUMBER: 34 umol/L (ref >=30.5)
LDL Particle Number: 609 nmol/L (ref <1000)
LDL SIZE: 20.9 nm (ref >20.5)
LDL-C: 58 mg/dL (ref 0–99)
LP-IR SCORE: 32 (ref <=45)
Small LDL Particle Number: 193 nmol/L (ref <=527)
TRIGLYCERIDES BY NMR: 63 mg/dL (ref 0–149)

## 2016-04-26 LAB — CBC WITH DIFFERENTIAL/PLATELET
Basophils Absolute: 0 10*3/uL (ref 0.0–0.2)
Basos: 0 %
EOS (ABSOLUTE): 0.5 10*3/uL — ABNORMAL HIGH (ref 0.0–0.4)
EOS: 6 %
HEMOGLOBIN: 12.4 g/dL — AB (ref 12.6–17.7)
Hematocrit: 37.8 % (ref 37.5–51.0)
IMMATURE GRANS (ABS): 0 10*3/uL (ref 0.0–0.1)
IMMATURE GRANULOCYTES: 0 %
LYMPHS ABS: 1.7 10*3/uL (ref 0.7–3.1)
LYMPHS: 18 %
MCH: 33.8 pg — ABNORMAL HIGH (ref 26.6–33.0)
MCHC: 32.8 g/dL (ref 31.5–35.7)
MCV: 103 fL — ABNORMAL HIGH (ref 79–97)
MONOCYTES: 8 %
Monocytes Absolute: 0.8 10*3/uL (ref 0.1–0.9)
Neutrophils Absolute: 6.3 10*3/uL (ref 1.4–7.0)
Neutrophils: 68 %
Platelets: 268 10*3/uL (ref 150–379)
RBC: 3.67 x10E6/uL — AB (ref 4.14–5.80)
RDW: 13.3 % (ref 12.3–15.4)
WBC: 9.3 10*3/uL (ref 3.4–10.8)

## 2016-04-26 LAB — HEPATIC FUNCTION PANEL
ALBUMIN: 4.2 g/dL (ref 3.5–4.7)
ALK PHOS: 80 IU/L (ref 39–117)
ALT: 24 IU/L (ref 0–44)
AST: 28 IU/L (ref 0–40)
BILIRUBIN, DIRECT: 0.14 mg/dL (ref 0.00–0.40)
Bilirubin Total: 0.5 mg/dL (ref 0.0–1.2)
TOTAL PROTEIN: 7.1 g/dL (ref 6.0–8.5)

## 2016-05-02 ENCOUNTER — Encounter: Payer: Self-pay | Admitting: Family Medicine

## 2016-05-02 ENCOUNTER — Ambulatory Visit (INDEPENDENT_AMBULATORY_CARE_PROVIDER_SITE_OTHER): Payer: Medicare Other | Admitting: Family Medicine

## 2016-05-02 ENCOUNTER — Telehealth: Payer: Self-pay | Admitting: Family Medicine

## 2016-05-02 VITALS — BP 91/48 | HR 75 | Temp 97.8°F | Ht 68.5 in | Wt 169.0 lb

## 2016-05-02 DIAGNOSIS — G20A1 Parkinson's disease without dyskinesia, without mention of fluctuations: Secondary | ICD-10-CM

## 2016-05-02 DIAGNOSIS — G20 Parkinson's disease: Secondary | ICD-10-CM

## 2016-05-02 DIAGNOSIS — R0602 Shortness of breath: Secondary | ICD-10-CM

## 2016-05-02 DIAGNOSIS — I10 Essential (primary) hypertension: Secondary | ICD-10-CM

## 2016-05-02 DIAGNOSIS — E559 Vitamin D deficiency, unspecified: Secondary | ICD-10-CM

## 2016-05-02 DIAGNOSIS — C61 Malignant neoplasm of prostate: Secondary | ICD-10-CM | POA: Diagnosis not present

## 2016-05-02 DIAGNOSIS — I2510 Atherosclerotic heart disease of native coronary artery without angina pectoris: Secondary | ICD-10-CM

## 2016-05-02 DIAGNOSIS — D649 Anemia, unspecified: Secondary | ICD-10-CM | POA: Diagnosis not present

## 2016-05-02 DIAGNOSIS — E785 Hyperlipidemia, unspecified: Secondary | ICD-10-CM | POA: Diagnosis not present

## 2016-05-02 DIAGNOSIS — R7989 Other specified abnormal findings of blood chemistry: Secondary | ICD-10-CM

## 2016-05-02 DIAGNOSIS — R748 Abnormal levels of other serum enzymes: Secondary | ICD-10-CM

## 2016-05-02 DIAGNOSIS — R710 Precipitous drop in hematocrit: Secondary | ICD-10-CM

## 2016-05-02 NOTE — Addendum Note (Signed)
Addended by: Zannie Cove on: 05/02/2016 11:55 AM   Modules accepted: Orders

## 2016-05-02 NOTE — Patient Instructions (Addendum)
Medicare Annual Wellness Visit  [facility] and the medical providers at [facility] strive to bring you the best medical care.  In doing so we not only want to address your current medical conditions and concerns but also to detect new conditions early and prevent illness, disease and health-related problems.    Medicare offers a yearly Wellness Visit which allows our clinical staff to assess your need for preventative services including immunizations, lifestyle education, counseling to decrease risk of preventable diseases and screening for fall risk and other medical concerns.    This visit is provided free of charge (no copay) for all Medicare recipients. The clinical pharmacists at [facility] have begun to conduct these Wellness Visits which will also include a thorough review of all your medications.    As you primary medical provider recommend that you make an appointment for your Annual Wellness Visit if you have not done so already this year.  You may set up this appointment before you leave today or you may call back [phone] and schedule an appointment.  Please make sure when you call that you mention that you are scheduling your Annual Wellness Visit with the clinical pharmacist so that the appointment may be made for the proper length of time.     Continue current medications. Continue good therapeutic lifestyle changes which include good diet and exercise. Fall precautions discussed with patient. If an FOBT was given today- please return it to our front desk. If you are over 66 years old - you may need Prevnar 43 or the adult Pneumonia vaccine.  **Flu shots are available--- please call and schedule a FLU-CLINIC appointment**  After your visit with [place] today you will receive a survey in the mail or online from [employer] regarding your care with [place]. Please take a moment to fill this out. Your feedback is very  important to [place] as you can help [place] better understand your patient needs as well as improve your experience and satisfaction. WE CARE ABOUT YOU!!!   Come back in 4 weeks and get repeat CBC and BMP Continue to follow-up with urology and cardiology and neurology Do not forget to get your flu shot in 4 weeks

## 2016-05-02 NOTE — Telephone Encounter (Signed)
The patient needs to stop the Afrin. This is more habit forming and has a rebound effect. It also could probably have some adverse effects on his heart rhythm. He should restart the Flonase and do 2 sprays each nostril once a day and use nasal saline during the day also.

## 2016-05-02 NOTE — Progress Notes (Signed)
Subjective:    Patient ID: Keith Hill, male    DOB: 1931/12/28, 82 y.o.   MRN: [id number]  HPI Pt here for follow up and management of chronic medical problems which includes hyperlipidemia. He is taking medications regularly.The patient has a history of Parkinson's disease and sees Dr. Jannifer Franklin and recently saw him. He is had lab work done and we will review this during the visit today. He is also followed by Dr. Wynonia Lawman his cardiologist. He has had injections in his back from the orthopedic surgeon. This patient has had prostate cancer. He is followed every 6 months by the urologist, Dr. Jeffie Pollock. All of his lab work was good especially his cholesterol numbers. His hemoglobin was down just a little bit compared to 4 months ago. We will plan to repeat his CBC as well as repeat his BMP because of the creatinine being elevated. Liver function tests were within normal limits. Blood sugar was good. His vitamin D level was good. He has had some shortness of breath with exertion and at nighttime. His cardiologist recently did a stress test and everything was good on that. He has had a lot of problems with allergy and congestion and he is currently taking Claritin and some type of nose spray which she will let us know the name of before he leaves the office. He denies any chest pain or shortness of breath. He denies any problems with his intestinal tract including nausea vomiting diarrhea blood in the stool or black tarry bowel movements or trouble swallowing. He is active physically and exercises regularly. He is had several injections in his back by the orthopedic surgeon and he waits until this gets worse before he goes back for additional injections. We will repeat his BMP and CBC in about 4 weeks and make sure that he returns the FOBT cards that we've given him.     Patient Active Problem List   Diagnosis Date Noted  . Back pain 11/18/2013  . Vitamin D deficiency 08/28/2013  . Hiatal hernia with  gastroesophageal reflux 08/28/2013  . CAD (coronary artery disease) 04/08/2013  . Essential and other specified forms of tremor 03/31/2013  . Parkinson's 11/07/2012  . Hemorrhoids 02/17/2011  . Hyperlipidemia with target LDL less than 70   . Prostate cancer ([place])   . Hypertension   . Allergic rhinitis   . Colon polyps, history of    Outpatient Encounter Prescriptions as of 05/02/2016  Medication Sig  . amLODipine (NORVASC) 10 MG tablet TAKE (1/2) TABLET DAILY.  Marland Kitchen atorvastatin (LIPITOR) 40 MG tablet TAKE ONE TABLET AT BEDTIME  . carbidopa-levodopa (SINEMET IR) 25-100 MG tablet 1.5 tablets in the morning and 1 tablet at noon and evening  . Cholecalciferol (VITAMIN D3) 5000 UNITS CAPS Take 1 capsule by mouth 4 (four) times a week. Reported on 09/14/2015  . docusate sodium (COLACE) 100 MG capsule Take 100 mg by mouth 2 (two) times daily. daily  . FOLGARD RX 2.2-25-1 MG TABS TAKE 1 TABLET DAILY  . Krill Oil 300 MG CAPS Take by mouth daily. Reported on 09/14/2015  . Probiotic Product (PHILLIPS COLON HEALTH PO) Take by mouth 1 dose over 24 hours.    . rasagiline (AZILECT) 1 MG TABS tablet Take 1 tablet (1 mg total) by mouth daily.  Marland Kitchen terazosin (HYTRIN) 2 MG capsule TAKE (1) CAPSULE DAILY AT BEDTIME.  . valsartan-hydrochlorothiazide (DIOVAN-HCT) 320-25 MG tablet TAKE 1 TABLET ONCE A DAY (Patient taking differently: TAKE 1/4 TABLET ONCE A DAY)  No facility-administered encounter medications on file as of 05/02/2016.       Review of Systems  Constitutional: Negative.   HENT: Positive for congestion (nasal).        Seasonal allergies  Eyes: Negative.   Respiratory: Negative.   Cardiovascular: Negative.   Gastrointestinal: Negative.   Endocrine: Negative.   Genitourinary: Negative.   Musculoskeletal: Positive for back pain.  Skin: Negative.   Allergic/Immunologic: Negative.   Neurological: Negative.   Hematological: Negative.   Psychiatric/Behavioral: Negative.        Objective:    Physical Exam  Constitutional: He is oriented to person, place, and time. He appears well-developed and well-nourished. No distress.  The patient is pleasant and alert today. No tremor was observed.  HENT:  Head: Normocephalic and atraumatic.  Right Ear: External ear normal.  Left Ear: External ear normal.  Mouth/Throat: Oropharynx is clear and moist. No oropharyngeal exudate.  Nasal congestion left greater than right  Eyes: Conjunctivae and EOM are normal. Pupils are equal, round, and reactive to light. Right eye exhibits no discharge. Left eye exhibits no discharge. No scleral icterus.  Neck: Normal range of motion. Neck supple. No thyromegaly present.  No nodes and no bruits  Cardiovascular: Normal rate, regular rhythm, normal heart sounds and intact distal pulses.   No murmur heard. Distal pulses were slightly diminished bilaterally The heart has a regular rate and rhythm at 72/m  Pulmonary/Chest: Effort normal and breath sounds normal. No respiratory distress. He has no wheezes. He has no rales. He exhibits no tenderness.  Clear with a dry cough  Abdominal: Soft. Bowel sounds are normal. He exhibits no mass. There is no tenderness. There is no rebound and no guarding.  No abdominal tenderness bruits or organ enlargement  Genitourinary:  Genitourinary Comments: Patient follows up with urologist regularly because of prostate cancer and PSA  Musculoskeletal: Normal range of motion. He exhibits no edema.  Lymphadenopathy:    He has no cervical adenopathy.  Neurological: He is alert and oriented to person, place, and time. He has normal reflexes. No cranial nerve deficit.  Patient has Parkinson's but to see him that you would would not know this.  Skin: Skin is warm and dry. No rash noted.  Psychiatric: He has a normal mood and affect. His behavior is normal. Judgment and thought content normal.  Nursing note and vitals reviewed.  BP (!) 91/48 (BP Location: Left Arm)   Pulse 75   Temp  97.8 F (36.6 C) (Oral)   Ht 5' 8.5" (1.74 m)   Wt 169 lb (76.7 kg)   BMI 25.32 kg/m         Assessment & Plan:  1. Hypertension -The blood pressure is well controlled and he will continue with current medications.  2. Vitamin D deficiency -Continue with current treatment pending results of lab work  3. Hyperlipemia -Continue with aggressive therapeutic lifestyle changes and current treatment pending results of lab work  4. Prostate cancer [facility]) -Follow-up with urology  5. Parkinson's -Continue to follow-up with neurology  6. Shortness of breath -Stay well hydrated and do not use any fans in the home if possible. If the shortness of breath episodes continue when he gets his blood work in 4 weeks we will arrange for him to see a pulmonologist for further evaluation  7. Elevated serum creatinine -Avoid NSAIDs which she is already doing and recheck BMP in [redacted] weeks along with the CBC.  Patient Instructions  Medicare Annual Wellness Visit  [facility] and the medical providers at [facility] strive to bring you the best medical care.  In doing so we not only want to address your current medical conditions and concerns but also to detect new conditions early and prevent illness, disease and health-related problems.    Medicare offers a yearly Wellness Visit which allows our clinical staff to assess your need for preventative services including immunizations, lifestyle education, counseling to decrease risk of preventable diseases and screening for fall risk and other medical concerns.    This visit is provided free of charge (no copay) for all Medicare recipients. The clinical pharmacists at [facility] have begun to conduct these Wellness Visits which will also include a thorough review of all your medications.    As you primary medical provider recommend that you make an appointment for your Annual Wellness Visit  if you have not done so already this year.  You may set up this appointment before you leave today or you may call back [phone]) and schedule an appointment.  Please make sure when you call that you mention that you are scheduling your Annual Wellness Visit with the clinical pharmacist so that the appointment may be made for the proper length of time.     Continue current medications. Continue good therapeutic lifestyle changes which include good diet and exercise. Fall precautions discussed with patient. If an FOBT was given today- please return it to our front desk. If you are over 10 years old - you may need Prevnar 43 or the adult Pneumonia vaccine.  **Flu shots are available--- please call and schedule a FLU-CLINIC appointment**  After your visit with [place] today you will receive a survey in the mail or online from [employer] regarding your care with [place]. Please take a moment to fill this out. Your feedback is very important to [place] as you can help [place] better understand your patient needs as well as improve your experience and satisfaction. WE CARE ABOUT YOU!!!   Come back in 4 weeks and get repeat CBC and BMP Continue to follow-up with urology and cardiology and neurology Do not forget to get your flu shot in 4 weeks    Arrie Senate MD

## 2016-05-05 ENCOUNTER — Telehealth: Payer: Self-pay | Admitting: Neurology

## 2016-05-05 ENCOUNTER — Other Ambulatory Visit: Payer: Medicare Other

## 2016-05-05 DIAGNOSIS — Z1211 Encounter for screening for malignant neoplasm of colon: Secondary | ICD-10-CM

## 2016-05-05 NOTE — Telephone Encounter (Signed)
I called patient. He has a friend that is on Sinemet and Mirapex combination, we are treating him with Azilect and Sinemet. Certainly Mirapex can be used in Parkinson's disease, we discussed this issue.

## 2016-05-09 LAB — FECAL OCCULT BLOOD, IMMUNOCHEMICAL: FECAL OCCULT BLD: NEGATIVE

## 2016-05-18 ENCOUNTER — Ambulatory Visit (INDEPENDENT_AMBULATORY_CARE_PROVIDER_SITE_OTHER): Payer: Medicare Other

## 2016-05-18 DIAGNOSIS — Z23 Encounter for immunization: Secondary | ICD-10-CM | POA: Diagnosis not present

## 2016-05-29 ENCOUNTER — Encounter: Payer: Self-pay | Admitting: Family Medicine

## 2016-05-29 ENCOUNTER — Ambulatory Visit (INDEPENDENT_AMBULATORY_CARE_PROVIDER_SITE_OTHER): Payer: Medicare Other | Admitting: Family Medicine

## 2016-05-29 VITALS — BP 89/55 | HR 91 | Temp 97.5°F | Ht 68.5 in | Wt 168.0 lb

## 2016-05-29 DIAGNOSIS — I4891 Unspecified atrial fibrillation: Secondary | ICD-10-CM

## 2016-05-29 DIAGNOSIS — R0981 Nasal congestion: Secondary | ICD-10-CM | POA: Diagnosis not present

## 2016-05-29 DIAGNOSIS — I499 Cardiac arrhythmia, unspecified: Secondary | ICD-10-CM

## 2016-05-29 DIAGNOSIS — G20 Parkinson's disease: Secondary | ICD-10-CM | POA: Diagnosis not present

## 2016-05-29 DIAGNOSIS — M5442 Lumbago with sciatica, left side: Secondary | ICD-10-CM | POA: Diagnosis not present

## 2016-05-29 DIAGNOSIS — J029 Acute pharyngitis, unspecified: Secondary | ICD-10-CM

## 2016-05-29 MED ORDER — AMOXICILLIN 500 MG PO CAPS: 500.0000 mg | ORAL_CAPSULE | Freq: Three times a day (TID) | ORAL | 0 refills | Status: DC

## 2016-05-29 MED ORDER — APIXABAN 5 MG PO TABS: 5.0000 mg | ORAL_TABLET | Freq: Two times a day (BID) | ORAL | 3 refills | Status: DC

## 2016-05-29 NOTE — Patient Instructions (Signed)
Take antibiotic as directed Drink plenty of fluids and stay well hydrated Take Tylenol as needed for aches pains and fever Take Mucinex maximum strength plain, blue and white in color, and take one twice daily with a large glass of water twice daily for cough and congestion Use nasal saline frequently through the day

## 2016-05-29 NOTE — Progress Notes (Signed)
Subjective:    Patient ID: Keith Hill, male    DOB: 1932/03/17, 82 y.o.   MRN: [id number]  HPI Patient here today for congestion and sore throat that started last night.The patient has not had a fever. Most of this congestion has been in his head. He also has had a sore throat and did not sleep well. The patient normally sees Dr. Wynonia Lawman for his cardiac problems. He had a recent stress test and the patient says this was good. The patient has had a slight cough and the congestion appears to be more clear. The patient denies any shortness of breath or chest pain. He has not had any problems with his intestinal track. He does mention that his heart has been irregular at times.    Patient Active Problem List   Diagnosis Date Noted  . Back pain 11/18/2013  . Vitamin D deficiency 08/28/2013  . Hiatal hernia with gastroesophageal reflux 08/28/2013  . CAD (coronary artery disease) 04/08/2013  . Essential and other specified forms of tremor 03/31/2013  . Parkinson's 11/07/2012  . Hemorrhoids 02/17/2011  . Hyperlipidemia with target LDL less than 70   . Prostate cancer ([place])   . Hypertension   . Allergic rhinitis   . Colon polyps, history of    Outpatient Encounter Prescriptions as of 05/29/2016  Medication Sig  . amLODipine (NORVASC) 10 MG tablet TAKE (1/2) TABLET DAILY.  Marland Kitchen atorvastatin (LIPITOR) 40 MG tablet TAKE ONE TABLET AT BEDTIME  . carbidopa-levodopa (SINEMET IR) 25-100 MG tablet 1.5 tablets in the morning and 1 tablet at noon and evening  . Cholecalciferol (VITAMIN D3) 5000 UNITS CAPS Take 1 capsule by mouth 4 (four) times a week. Reported on 09/14/2015  . docusate sodium (COLACE) 100 MG capsule Take 100 mg by mouth 2 (two) times daily. daily  . FOLGARD RX 2.2-25-1 MG TABS TAKE 1 TABLET DAILY  . Krill Oil 300 MG CAPS Take by mouth daily. Reported on 09/14/2015  . oxymetazoline (AFRIN) 0.05 % nasal spray Place 1 spray into both nostrils 2 (two) times daily.  . Probiotic Product  ([place]) Take by mouth 1 dose over 24 hours.    . rasagiline (AZILECT) 1 MG TABS tablet Take 1 tablet (1 mg total) by mouth daily.  Marland Kitchen terazosin (HYTRIN) 2 MG capsule TAKE (1) CAPSULE DAILY AT BEDTIME.  . valsartan-hydrochlorothiazide (DIOVAN-HCT) 320-25 MG tablet TAKE 1 TABLET ONCE A DAY (Patient taking differently: TAKE 1/4 TABLET ONCE A DAY)   No facility-administered encounter medications on file as of 05/29/2016.       Review of Systems  Constitutional: Negative.   HENT: Positive for congestion, postnasal drip and sore throat.   Eyes: Negative.   Respiratory: Negative.   Cardiovascular: Negative.   Gastrointestinal: Negative.   Endocrine: Negative.   Genitourinary: Negative.  Penile pain: .vs.  Musculoskeletal: Negative.   Skin: Negative.   Allergic/Immunologic: Negative.   Neurological: Negative.   Hematological: Negative.   Psychiatric/Behavioral: Negative.        Objective:   Physical Exam  Constitutional: He is oriented to person, place, and time. He appears well-developed and well-nourished. No distress.  HENT:  Head: Normocephalic and atraumatic.  Right Ear: External ear normal.  Left Ear: External ear normal.  Mouth/Throat: Oropharynx is clear and moist. No oropharyngeal exudate.  Nasal congestion left greater than right. No sinus tenderness to palpation. Throat is red posteriorly. No anterior cervical adenopathy.  Eyes: Conjunctivae and EOM are normal. Pupils are equal,  round, and reactive to light. Right eye exhibits no discharge. Left eye exhibits no discharge. No scleral icterus.  Neck: Normal range of motion. Neck supple. No thyromegaly present.  Cardiovascular: Normal rate and normal heart sounds.   No murmur heard. The heart is irregular irregular at 96/m  Pulmonary/Chest: Effort normal and breath sounds normal. No respiratory distress. He has no wheezes. He has no rales.  Slight and minimal congestion with coughing  Abdominal: Soft. Bowel  sounds are normal. He exhibits no mass. There is no tenderness. There is no rebound and no guarding.  Musculoskeletal: Normal range of motion. He exhibits no edema.  Lymphadenopathy:    He has no cervical adenopathy.  Neurological: He is alert and oriented to person, place, and time.  Skin: Skin is warm and dry. No rash noted.  Psychiatric: He has a normal mood and affect. His behavior is normal. Judgment and thought content normal.  Nursing note and vitals reviewed.    BP (!) 89/55 (BP Location: Left Arm)   Pulse 91   Temp 97.5 F (36.4 C) (Oral)   Ht 5' 8.5" (1.74 m)   Wt 168 lb (76.2 kg)   BMI 25.17 kg/m    EKG:  Atrial fibrillation with a rate of 84/m      Assessment & Plan:  1. Irregular heart beat -This revealed atrial fibrillation. With a ventricular rate at 84/m. - EKG 12-Lead  2. Sore throat -Amoxicillin 503 times a day 10 days  3. Head congestion -Nasal saline  4. Parkinson's -And 10 you current treatment and follow-up with neurologist as planned  5. Left-sided low back pain with left-sided sciatica, unspecified chronicity -Follow-up with neurosurgery as planned  6. Atrial fibrillation, unspecified type ([place]) -We will discuss this new finding with Dr. Wynonia Lawman and follow his recommendations.  Take amoxicillin 500 3 times daily  Patient Instructions  Take antibiotic as directed Drink plenty of fluids and stay well hydrated Take Tylenol as needed for aches pains and fever Take Mucinex maximum strength plain, blue and white in color, and take one twice daily with a large glass of water twice daily for cough and congestion Use nasal saline frequently through the day  Arrie Senate MD

## 2016-05-29 NOTE — Addendum Note (Signed)
Addended by: Zannie Cove on: 05/29/2016 04:52 PM   Modules accepted: Orders

## 2016-06-05 ENCOUNTER — Telehealth: Payer: Self-pay | Admitting: Family Medicine

## 2016-06-05 NOTE — Telephone Encounter (Signed)
That is okay.

## 2016-06-05 NOTE — Telephone Encounter (Signed)
Pt aware of Dr. Livia Snellen agreement with orthopedics recommendation on blood thinner

## 2016-06-12 DIAGNOSIS — R0609 Other forms of dyspnea: Secondary | ICD-10-CM | POA: Diagnosis not present

## 2016-06-13 ENCOUNTER — Other Ambulatory Visit (INDEPENDENT_AMBULATORY_CARE_PROVIDER_SITE_OTHER): Payer: Medicare Other

## 2016-06-13 DIAGNOSIS — C61 Malignant neoplasm of prostate: Secondary | ICD-10-CM | POA: Diagnosis not present

## 2016-06-13 DIAGNOSIS — Z0000 Encounter for general adult medical examination without abnormal findings: Secondary | ICD-10-CM

## 2016-06-14 LAB — BMP8+EGFR
BUN/Creatinine Ratio: 13 (ref 10–24)
BUN: 17 mg/dL (ref 8–27)
CO2: 24 mmol/L (ref 18–29)
Calcium: 9.3 mg/dL (ref 8.6–10.2)
Chloride: 99 mmol/L (ref 96–106)
Creatinine, Ser: 1.3 mg/dL — ABNORMAL HIGH (ref 0.76–1.27)
GFR calc Af Amer: 59 mL/min/{1.73_m2} — ABNORMAL LOW (ref >59)
GFR, EST NON AFRICAN AMERICAN: 51 mL/min/{1.73_m2} — AB (ref >59)
GLUCOSE: 108 mg/dL — AB (ref 65–99)
POTASSIUM: 3.6 mmol/L (ref 3.5–5.2)
SODIUM: 141 mmol/L (ref 134–144)

## 2016-06-14 LAB — CBC WITH DIFFERENTIAL/PLATELET
BASOS: 1 %
Basophils Absolute: 0.1 10*3/uL (ref 0.0–0.2)
EOS (ABSOLUTE): 0.3 10*3/uL (ref 0.0–0.4)
Eos: 3 %
Hematocrit: 38.1 % (ref 37.5–51.0)
Hemoglobin: 12.8 g/dL (ref 12.6–17.7)
IMMATURE GRANULOCYTES: 0 %
Immature Grans (Abs): 0 10*3/uL (ref 0.0–0.1)
Lymphocytes Absolute: 1.9 10*3/uL (ref 0.7–3.1)
Lymphs: 20 %
MCH: 33.8 pg — ABNORMAL HIGH (ref 26.6–33.0)
MCHC: 33.6 g/dL (ref 31.5–35.7)
MCV: 101 fL — AB (ref 79–97)
Monocytes Absolute: 0.8 10*3/uL (ref 0.1–0.9)
Monocytes: 8 %
NEUTROS PCT: 68 %
Neutrophils Absolute: 6.3 10*3/uL (ref 1.4–7.0)
PLATELETS: 269 10*3/uL (ref 150–379)
RBC: 3.79 x10E6/uL — ABNORMAL LOW (ref 4.14–5.80)
RDW: 13.3 % (ref 12.3–15.4)
WBC: 9.4 10*3/uL (ref 3.4–10.8)

## 2016-06-14 LAB — PSA, TOTAL AND FREE
PROSTATE SPECIFIC AG, SERUM: 4.1 ng/mL — AB (ref 0.0–4.0)
PSA FREE PCT: 15.9 %
PSA FREE: 0.65 ng/mL

## 2016-06-16 DIAGNOSIS — Z7901 Long term (current) use of anticoagulants: Secondary | ICD-10-CM | POA: Diagnosis not present

## 2016-06-16 DIAGNOSIS — E785 Hyperlipidemia, unspecified: Secondary | ICD-10-CM | POA: Diagnosis not present

## 2016-06-16 DIAGNOSIS — I481 Persistent atrial fibrillation: Secondary | ICD-10-CM | POA: Diagnosis not present

## 2016-06-16 DIAGNOSIS — R0602 Shortness of breath: Secondary | ICD-10-CM | POA: Diagnosis not present

## 2016-06-16 DIAGNOSIS — R0609 Other forms of dyspnea: Secondary | ICD-10-CM | POA: Diagnosis not present

## 2016-06-16 DIAGNOSIS — I2510 Atherosclerotic heart disease of native coronary artery without angina pectoris: Secondary | ICD-10-CM | POA: Diagnosis not present

## 2016-06-16 DIAGNOSIS — G20 Parkinson's disease: Secondary | ICD-10-CM | POA: Diagnosis not present

## 2016-06-16 DIAGNOSIS — I480 Paroxysmal atrial fibrillation: Secondary | ICD-10-CM | POA: Diagnosis not present

## 2016-06-16 DIAGNOSIS — I10 Essential (primary) hypertension: Secondary | ICD-10-CM | POA: Diagnosis not present

## 2016-06-19 ENCOUNTER — Ambulatory Visit (INDEPENDENT_AMBULATORY_CARE_PROVIDER_SITE_OTHER): Payer: Medicare Other | Admitting: Family Medicine

## 2016-06-19 ENCOUNTER — Encounter: Payer: Self-pay | Admitting: Family Medicine

## 2016-06-19 VITALS — BP 114/67 | HR 77 | Temp 97.7°F | Ht 68.5 in | Wt 166.4 lb

## 2016-06-19 DIAGNOSIS — J189 Pneumonia, unspecified organism: Secondary | ICD-10-CM

## 2016-06-19 DIAGNOSIS — I2510 Atherosclerotic heart disease of native coronary artery without angina pectoris: Secondary | ICD-10-CM | POA: Diagnosis not present

## 2016-06-19 DIAGNOSIS — I2583 Coronary atherosclerosis due to lipid rich plaque: Secondary | ICD-10-CM

## 2016-06-19 DIAGNOSIS — J181 Lobar pneumonia, unspecified organism: Secondary | ICD-10-CM

## 2016-06-19 MED ORDER — AZITHROMYCIN 250 MG PO TABS: ORAL_TABLET | ORAL | 0 refills | Status: DC

## 2016-06-19 NOTE — Patient Instructions (Signed)
Great to see you!  Increase the nasal spray to 2 sprays a day for only Monday through Friday  Start azithromycin today and be sure to finish all of the pills.    Community-Acquired Pneumonia, Adult Pneumonia is an infection of the lungs. There are different types of pneumonia. One type can develop while a person is in a hospital. A different type, called community-acquired pneumonia, develops in people who are not, or have not recently been, in the hospital or other health care facility.  CAUSES Pneumonia may be caused by bacteria, viruses, or funguses. Community-acquired pneumonia is often caused by Streptococcus pneumonia bacteria. These bacteria are often passed from one person to another by breathing in droplets from the cough or sneeze of an infected person. RISK FACTORS The condition is more likely to develop in:  People who havechronic diseases, such as chronic obstructive pulmonary disease (COPD), asthma, congestive heart failure, cystic fibrosis, diabetes, or kidney disease.  People who haveearly-stage or late-stage HIV.  People who havesickle cell disease.  People who havehad their spleen removed (splenectomy).  People who havepoor Human resources officer.  People who havemedical conditions that increase the risk of breathing in (aspirating) secretions their own mouth and nose.   People who havea weakened immune system (immunocompromised).  People who smoke.  People whotravel to areas where pneumonia-causing germs commonly exist.  People whoare around animal habitats or animals that have pneumonia-causing germs, including birds, bats, rabbits, cats, and farm animals. SYMPTOMS Symptoms of this condition include:  Adry cough.  A wet (productive) cough.  Fever.  Sweating.  Chest pain, especially when breathing deeply or coughing.  Rapid breathing or difficulty breathing.  Shortness of breath.  Shaking chills.  Fatigue.  Muscle aches. DIAGNOSIS Your  health care provider will take a medical history and perform a physical exam. You may also have other tests, including:  Imaging studies of your chest, including X-rays.  Tests to check your blood oxygen level and other blood gases.  Other tests on blood, mucus (sputum), fluid around your lungs (pleural fluid), and urine. If your pneumonia is severe, other tests may be done to identify the specific cause of your illness. TREATMENT The type of treatment that you receive depends on many factors, such as the cause of your pneumonia, the medicines you take, and other medical conditions that you have. For most adults, treatment and recovery from pneumonia may occur at home. In some cases, treatment must happen in a hospital. Treatment may include:  Antibiotic medicines, if the pneumonia was caused by bacteria.  Antiviral medicines, if the pneumonia was caused by a virus.  Medicines that are given by mouth or through an IV tube.  Oxygen.  Respiratory therapy. Although rare, treating severe pneumonia may include:  Mechanical ventilation. This is done if you are not breathing well on your own and you cannot maintain a safe blood oxygen level.  Thoracentesis. This procedureremoves fluid around one lung or both lungs to help you breathe better. HOME CARE INSTRUCTIONS  Take over-the-counter and prescription medicines only as told by your health care provider.  Only takecough medicine if you are losing sleep. Understand that cough medicine can prevent your body's natural ability to remove mucus from your lungs.  If you were prescribed an antibiotic medicine, take it as told by your health care provider. Do not stop taking the antibiotic even if you start to feel better.  Sleep in a semi-upright position at night. Try sleeping in a reclining chair, or place a  few pillows under your head.  Do not use tobacco products, including cigarettes, chewing tobacco, and e-cigarettes. If you need help  quitting, ask your health care provider.  Drink enough water to keep your urine clear or pale yellow. This will help to thin out mucus secretions in your lungs. PREVENTION There are ways that you can decrease your risk of developing community-acquired pneumonia. Consider getting a pneumococcal vaccine if:  You are older than 80 years of age.  You are older than 80 years of age and are undergoing cancer treatment, have chronic lung disease, or have other medical conditions that affect your immune system. Ask your health care provider if this applies to you. There are different types and schedules of pneumococcal vaccines. Ask your health care provider which vaccination option is best for you. You may also prevent community-acquired pneumonia if you take these actions:  Get an influenza vaccine every year. Ask your health care provider which type of influenza vaccine is best for you.  Go to the dentist on a regular basis.  Wash your hands often. Use hand sanitizer if soap and water are not available. SEEK MEDICAL CARE IF:  You have a fever.  You are losing sleep because you cannot control your cough with cough medicine. SEEK IMMEDIATE MEDICAL CARE IF:  You have worsening shortness of breath.  You have increased chest pain.  Your sickness becomes worse, especially if you are an older adult or have a weakened immune system.  You cough up blood.   This information is not intended to replace advice given to you by your health care provider. Make sure you discuss any questions you have with your health care provider.   Document Released: 08/07/2005 Document Revised: 04/28/2015 Document Reviewed: 12/02/2014 Elsevier Interactive Patient Education [employer].

## 2016-06-19 NOTE — Progress Notes (Signed)
   HPI  Patient presents today here with cough and congestion.  Patient states that he's had cough, nasal congestion, sore throat that got much more severe over the night. He states he feels like "a train hit him". He has normal appetite, denies dyspnea. He denies fever and chills.  He does have malaise.  He started Flonase about one month ago for postnasal drip related sore throat that did improve.  He still taking Flonase.  PMH: Smoking status noted ROS: Per HPI  Objective: BP 114/67   Pulse 77   Temp 97.7 F (36.5 C) (Oral)   Ht 5' 8.5" (1.74 m)   Wt 166 lb 6.4 oz (75.5 kg)   BMI 24.93 kg/m  Gen: NAD, alert, cooperative with exam HEENT: NCAT, EOMI, PERRL CV: RRR, good S1/S2, no murmur Resp: Nonlabored, right lower lung field with persistent soft crackles Ext: No edema, warm Neuro: Alert and oriented, No gross deficits  Assessment and plan:  # Knee acquired pneumonia Given the setting of extreme malaise and worsening cough with persistent added lung sounds are covered him with azithromycin for empiric treatment for pneumonia Increase Flonase to twice daily for 5 days Supportive care discussed RTC with any concerns    Meds ordered this encounter  Medications  . azithromycin (ZITHROMAX) 250 MG tablet    Sig: Take 2 tablets on day 1 and 1 tablet daily after that    Dispense:  6 tablet    Refill:  0    Laroy Apple, MD [facility] 06/19/2016, 8:12 AM

## 2016-06-21 DIAGNOSIS — M5136 Other intervertebral disc degeneration, lumbar region: Secondary | ICD-10-CM | POA: Diagnosis not present

## 2016-06-22 DIAGNOSIS — R351 Nocturia: Secondary | ICD-10-CM | POA: Diagnosis not present

## 2016-06-22 DIAGNOSIS — R972 Elevated prostate specific antigen [PSA]: Secondary | ICD-10-CM | POA: Diagnosis not present

## 2016-06-22 DIAGNOSIS — N402 Nodular prostate without lower urinary tract symptoms: Secondary | ICD-10-CM | POA: Diagnosis not present

## 2016-06-22 DIAGNOSIS — C61 Malignant neoplasm of prostate: Secondary | ICD-10-CM | POA: Diagnosis not present

## 2016-06-26 ENCOUNTER — Other Ambulatory Visit: Payer: Self-pay | Admitting: Family Medicine

## 2016-07-28 DIAGNOSIS — E785 Hyperlipidemia, unspecified: Secondary | ICD-10-CM | POA: Diagnosis not present

## 2016-07-28 DIAGNOSIS — I2510 Atherosclerotic heart disease of native coronary artery without angina pectoris: Secondary | ICD-10-CM | POA: Diagnosis not present

## 2016-07-28 DIAGNOSIS — G20 Parkinson's disease: Secondary | ICD-10-CM | POA: Diagnosis not present

## 2016-07-28 DIAGNOSIS — I481 Persistent atrial fibrillation: Secondary | ICD-10-CM | POA: Diagnosis not present

## 2016-07-28 DIAGNOSIS — I10 Essential (primary) hypertension: Secondary | ICD-10-CM | POA: Diagnosis not present

## 2016-07-28 DIAGNOSIS — R0602 Shortness of breath: Secondary | ICD-10-CM | POA: Diagnosis not present

## 2016-07-28 DIAGNOSIS — Z7901 Long term (current) use of anticoagulants: Secondary | ICD-10-CM | POA: Diagnosis not present

## 2016-08-17 ENCOUNTER — Other Ambulatory Visit: Payer: Self-pay | Admitting: Family Medicine

## 2016-08-24 DIAGNOSIS — I2510 Atherosclerotic heart disease of native coronary artery without angina pectoris: Secondary | ICD-10-CM | POA: Diagnosis not present

## 2016-08-24 DIAGNOSIS — Z7901 Long term (current) use of anticoagulants: Secondary | ICD-10-CM | POA: Diagnosis not present

## 2016-08-24 DIAGNOSIS — I10 Essential (primary) hypertension: Secondary | ICD-10-CM | POA: Diagnosis not present

## 2016-08-24 DIAGNOSIS — E785 Hyperlipidemia, unspecified: Secondary | ICD-10-CM | POA: Diagnosis not present

## 2016-08-24 DIAGNOSIS — R0602 Shortness of breath: Secondary | ICD-10-CM | POA: Diagnosis not present

## 2016-08-24 DIAGNOSIS — G20 Parkinson's disease: Secondary | ICD-10-CM | POA: Diagnosis not present

## 2016-08-24 DIAGNOSIS — I481 Persistent atrial fibrillation: Secondary | ICD-10-CM | POA: Diagnosis not present

## 2016-08-29 ENCOUNTER — Encounter: Payer: Self-pay | Admitting: Neurology

## 2016-08-29 ENCOUNTER — Ambulatory Visit (INDEPENDENT_AMBULATORY_CARE_PROVIDER_SITE_OTHER): Payer: Medicare Other | Admitting: Neurology

## 2016-08-29 VITALS — BP 135/80 | HR 64 | Ht 68.5 in | Wt 164.0 lb

## 2016-08-29 DIAGNOSIS — G20A1 Parkinson's disease without dyskinesia, without mention of fluctuations: Secondary | ICD-10-CM

## 2016-08-29 DIAGNOSIS — G20 Parkinson's disease: Secondary | ICD-10-CM | POA: Diagnosis not present

## 2016-08-29 MED ORDER — RASAGILINE MESYLATE 1 MG PO TABS: 1.0000 mg | ORAL_TABLET | Freq: Every day | ORAL | 3 refills | Status: DC

## 2016-08-29 NOTE — Progress Notes (Signed)
Reason for visit: Parkinson's disease  Keith Hill is an 83 y.o. male  History of present illness:  Keith Hill is an 83 year old right-handed white male with a history of Parkinson's disease with right-sided features. The patient has continued to have some low back pain which is his major disability at this point. The patient has undergone epidural steroid injections without benefit. The finds that if he stands too long or walks too long the back pain increases. He does use a lumbar support device. He is seeing a chiropractor for the back as well. The patient returns to the office today for an evaluation. He is on low-dose Sinemet and he takes Azilect. He is tolerating the medications well. He is remaining active.  Past Medical History:  Diagnosis Date  . Allergic rhinitis, cause unspecified   . Cardiac dysrhythmia, unspecified   . Colon polyps   . Essential and other specified forms of tremor   . GERD (gastroesophageal reflux disease)   . Hemorrhoids, external   . Hiatal hernia   . Hyperlipidemia   . Hyperplasia of prostate   . Hypertension   . Lumbar disc disease   . Pancreatitis 1976  . Paralysis agitans ([place]) 11/07/2012  . Peyronie's disease   . Prostate cancer [facility])     Past Surgical History:  Procedure Laterality Date  . HEMORRHOID SURGERY    . KNEE SURGERY Left   . PROSTATE BIOPSY  04/03/2013  . PROSTATE BIOPSY  11/01/2011  . SHOULDER SURGERY Left     Family History  Problem Relation Age of Onset  . Stroke Father   . Kidney failure Father   . Alcoholism Brother   . Heart attack Brother   . Cancer Neg Hx     Social history:  reports that he has never smoked. He has never used smokeless tobacco. He reports that he does not drink alcohol or use drugs.    Allergies  Allergen Reactions  . Aspirin     Upsets stomach  . Calicum Glycerophosphate   . Niaspan [Niacin Er]   . Zocor [Simvastatin]     Medications:  Prior to Admission medications   Medication  Sig Start Date End Date Taking? Authorizing Provider  amLODipine (NORVASC) 10 MG tablet TAKE (1/2) TABLET DAILY. 08/17/16  Yes Chipper Herb, MD  apixaban (ELIQUIS) 5 MG TABS tablet Take 1 tablet (5 mg total) by mouth 2 (two) times daily. 05/29/16  Yes Chipper Herb, MD  atorvastatin (LIPITOR) 40 MG tablet TAKE ONE TABLET AT BEDTIME 01/31/16  Yes Chipper Herb, MD  carbidopa-levodopa (SINEMET IR) 25-100 MG tablet 1.5 tablets in the morning and 1 tablet at noon and evening 03/28/16  Yes Kathrynn Ducking, MD  Cholecalciferol (VITAMIN D3) 5000 UNITS CAPS Take 1 capsule by mouth 4 (four) times a week. Reported on 09/14/2015   Yes Historical Provider, MD  docusate sodium (COLACE) 100 MG capsule Take 100 mg by mouth 2 (two) times daily. daily   Yes Historical Provider, MD  FOLGARD RX 2.2-25-1 MG TABS TAKE 1 TABLET DAILY 04/03/16  Yes Chipper Herb, MD  Krill Oil 300 MG CAPS Take by mouth daily. Reported on 09/14/2015   Yes Historical Provider, MD  metoprolol (LOPRESSOR) 50 MG tablet Take 50 mg by mouth 2 (two) times daily.   Yes Historical Provider, MD  oxymetazoline (AFRIN) 0.05 % nasal spray Place 1 spray into both nostrils 2 (two) times daily.   Yes Historical Provider, MD  Probiotic  Product (PHILLIPS COLON HEALTH PO) Take by mouth 1 dose over 24 hours.     Yes Historical Provider, MD  rasagiline (AZILECT) 1 MG TABS tablet Take 1 tablet (1 mg total) by mouth daily. 08/29/16  Yes Kathrynn Ducking, MD  terazosin (HYTRIN) 2 MG capsule TAKE (1) CAPSULE DAILY AT BEDTIME. 06/26/16  Yes Chipper Herb, MD  valsartan-hydrochlorothiazide (DIOVAN-HCT) 320-25 MG tablet TAKE 1 TABLET ONCE A DAY Patient taking differently: TAKE 1/4 TABLET ONCE A DAY 01/31/16  Yes Chipper Herb, MD    ROS:  Out of a complete 14 system review of symptoms, the patient complains only of the following symptoms, and all other reviewed systems are negative.  Back pain Tremors  Blood pressure 135/80, pulse 64, height 5' 8.5" (1.74 m),  weight 164 lb (74.4 kg).  Physical Exam  General: The patient is alert and cooperative at the time of the examination.  Skin: No significant peripheral edema is noted.   Neurologic Exam  Mental status: The patient is alert and oriented x 3 at the time of the examination. The patient has apparent normal recent and remote memory, with an apparently normal attention span and concentration ability.   Cranial nerves: Facial symmetry is present. Speech is normal, no aphasia or dysarthria is noted. Extraocular movements are full. Visual fields are full.  Motor: The patient has good strength in all 4 extremities.  Sensory examination: Soft touch sensation is symmetric on the face, arms, and legs.  Coordination: The patient has good finger-nose-finger and heel-to-shin bilaterally. A resting tremors noted with the right upper extremity.  Gait and station: The patient is able to arise from a seated position with arms crossed. Once up, he walks independently, he has a stooped posture, good turns and good stride is noted. Tandem gait is normal. Romberg is negative. No drift is seen.  Reflexes: Deep tendon reflexes are symmetric.   Assessment/Plan:  1. Parkinson's disease  2. Chronic low back pain  The patient is mainly bothered by his low back pain at this time. We will continue the Sinemet at the current dose and a prescription was given for Azilect. The patient was set up for a revisit in 5 months.  Jill Alexanders MD 08/29/2016 10:33 AM  [facility] [address] [place], [place] [address]  Phone [phone] Fax [phone]

## 2016-08-31 ENCOUNTER — Other Ambulatory Visit: Payer: Medicare Other

## 2016-08-31 DIAGNOSIS — G20A1 Parkinson's disease without dyskinesia, without mention of fluctuations: Secondary | ICD-10-CM

## 2016-08-31 DIAGNOSIS — E559 Vitamin D deficiency, unspecified: Secondary | ICD-10-CM

## 2016-08-31 DIAGNOSIS — C61 Malignant neoplasm of prostate: Secondary | ICD-10-CM

## 2016-08-31 DIAGNOSIS — E7800 Pure hypercholesterolemia, unspecified: Secondary | ICD-10-CM

## 2016-08-31 DIAGNOSIS — G20 Parkinson's disease: Secondary | ICD-10-CM | POA: Diagnosis not present

## 2016-08-31 DIAGNOSIS — I10 Essential (primary) hypertension: Secondary | ICD-10-CM

## 2016-08-31 DIAGNOSIS — I4891 Unspecified atrial fibrillation: Secondary | ICD-10-CM

## 2016-09-01 LAB — NMR, LIPOPROFILE
CHOLESTEROL: 122 mg/dL (ref 100–199)
HDL Cholesterol by NMR: 52 mg/dL (ref >39)
HDL PARTICLE NUMBER: 30.5 umol/L (ref >=30.5)
LDL Particle Number: 486 nmol/L (ref <1000)
LDL SIZE: 20.6 nm (ref >20.5)
LDL-C: 59 mg/dL (ref 0–99)
LP-IR Score: 36 (ref <=45)
SMALL LDL PARTICLE NUMBER: 150 nmol/L (ref <=527)
TRIGLYCERIDES BY NMR: 53 mg/dL (ref 0–149)

## 2016-09-01 LAB — BMP8+EGFR
BUN/Creatinine Ratio: 15 (ref 10–24)
BUN: 18 mg/dL (ref 8–27)
CALCIUM: 9 mg/dL (ref 8.6–10.2)
CHLORIDE: 101 mmol/L (ref 96–106)
CO2: 27 mmol/L (ref 18–29)
CREATININE: 1.18 mg/dL (ref 0.76–1.27)
GFR, EST AFRICAN AMERICAN: 66 mL/min/{1.73_m2} (ref >59)
GFR, EST NON AFRICAN AMERICAN: 57 mL/min/{1.73_m2} — AB (ref >59)
Glucose: 89 mg/dL (ref 65–99)
Potassium: 3.6 mmol/L (ref 3.5–5.2)
Sodium: 145 mmol/L — ABNORMAL HIGH (ref 134–144)

## 2016-09-01 LAB — CBC WITH DIFFERENTIAL/PLATELET
BASOS ABS: 0.1 10*3/uL (ref 0.0–0.2)
BASOS: 1 %
EOS (ABSOLUTE): 0.3 10*3/uL (ref 0.0–0.4)
Eos: 4 %
HEMOGLOBIN: 13.1 g/dL (ref 13.0–17.7)
Hematocrit: 41.2 % (ref 37.5–51.0)
IMMATURE GRANS (ABS): 0 10*3/uL (ref 0.0–0.1)
Immature Granulocytes: 0 %
LYMPHS: 22 %
Lymphocytes Absolute: 1.6 10*3/uL (ref 0.7–3.1)
MCH: 32.8 pg (ref 26.6–33.0)
MCHC: 31.8 g/dL (ref 31.5–35.7)
MCV: 103 fL — ABNORMAL HIGH (ref 79–97)
MONOCYTES: 10 %
Monocytes Absolute: 0.8 10*3/uL (ref 0.1–0.9)
NEUTROS ABS: 4.8 10*3/uL (ref 1.4–7.0)
Neutrophils: 63 %
Platelets: 206 10*3/uL (ref 150–379)
RBC: 4 x10E6/uL — ABNORMAL LOW (ref 4.14–5.80)
RDW: 14.3 % (ref 12.3–15.4)
WBC: 7.6 10*3/uL (ref 3.4–10.8)

## 2016-09-01 LAB — HEPATIC FUNCTION PANEL
ALK PHOS: 79 IU/L (ref 39–117)
ALT: 34 IU/L (ref 0–44)
AST: 26 IU/L (ref 0–40)
Albumin: 4 g/dL (ref 3.5–4.7)
BILIRUBIN, DIRECT: 0.2 mg/dL (ref 0.00–0.40)
Bilirubin Total: 0.6 mg/dL (ref 0.0–1.2)
TOTAL PROTEIN: 6.6 g/dL (ref 6.0–8.5)

## 2016-09-01 LAB — VITAMIN D 25 HYDROXY (VIT D DEFICIENCY, FRACTURES): VIT D 25 HYDROXY: 41.3 ng/mL (ref 30.0–100.0)

## 2016-09-04 ENCOUNTER — Other Ambulatory Visit: Payer: Self-pay | Admitting: Family Medicine

## 2016-09-05 ENCOUNTER — Ambulatory Visit (INDEPENDENT_AMBULATORY_CARE_PROVIDER_SITE_OTHER): Payer: Medicare Other | Admitting: Family Medicine

## 2016-09-05 ENCOUNTER — Encounter: Payer: Self-pay | Admitting: Family Medicine

## 2016-09-05 ENCOUNTER — Ambulatory Visit: Payer: Medicare Other | Admitting: Family Medicine

## 2016-09-05 VITALS — BP 103/55 | HR 90 | Temp 97.5°F | Ht 68.5 in | Wt 168.0 lb

## 2016-09-05 DIAGNOSIS — G8929 Other chronic pain: Secondary | ICD-10-CM

## 2016-09-05 DIAGNOSIS — I10 Essential (primary) hypertension: Secondary | ICD-10-CM | POA: Diagnosis not present

## 2016-09-05 DIAGNOSIS — M545 Low back pain: Secondary | ICD-10-CM

## 2016-09-05 DIAGNOSIS — G20 Parkinson's disease: Secondary | ICD-10-CM | POA: Diagnosis not present

## 2016-09-05 DIAGNOSIS — G20A1 Parkinson's disease without dyskinesia, without mention of fluctuations: Secondary | ICD-10-CM

## 2016-09-05 DIAGNOSIS — E7800 Pure hypercholesterolemia, unspecified: Secondary | ICD-10-CM

## 2016-09-05 DIAGNOSIS — C61 Malignant neoplasm of prostate: Secondary | ICD-10-CM | POA: Diagnosis not present

## 2016-09-05 DIAGNOSIS — E559 Vitamin D deficiency, unspecified: Secondary | ICD-10-CM | POA: Diagnosis not present

## 2016-09-05 DIAGNOSIS — I4891 Unspecified atrial fibrillation: Secondary | ICD-10-CM | POA: Diagnosis not present

## 2016-09-05 NOTE — Patient Instructions (Addendum)
Medicare Annual Wellness Visit  [place] and the medical providers at [facility] strive to bring you the best medical care.  In doing so we not only want to address your current medical conditions and concerns but also to detect new conditions early and prevent illness, disease and health-related problems.    Medicare offers a yearly Wellness Visit which allows our clinical staff to assess your need for preventative services including immunizations, lifestyle education, counseling to decrease risk of preventable diseases and screening for fall risk and other medical concerns.    This visit is provided free of charge (no copay) for all Medicare recipients. The clinical pharmacists at [facility] have begun to conduct these Wellness Visits which will also include a thorough review of all your medications.    As you primary medical provider recommend that you make an appointment for your Annual Wellness Visit if you have not done so already this year.  You may set up this appointment before you leave today or you may call back [id number] and schedule an appointment.  Please make sure when you call that you mention that you are scheduling your Annual Wellness Visit with the clinical pharmacist so that the appointment may be made for the proper length of time.     Continue current medications. Continue good therapeutic lifestyle changes which include good diet and exercise. Fall precautions discussed with patient. If an FOBT was given today- please return it to our front desk. If you are over 81 years old - you may need Prevnar 16 or the adult Pneumonia vaccine.  **Flu shots are available--- please call and schedule a FLU-CLINIC appointment**  After your visit with [place] today you will receive a survey in the mail or online from [employer] regarding your care with [place]. Please take a moment to fill this out. Your feedback is very  important to [place] as you can help [place] better understand your patient needs as well as improve your experience and satisfaction. WE CARE ABOUT YOU!!!   We will do a referral for you to Dr. branch who is in neurosurgery at [facility] to further evaluate your back and see if there are other options for helping relieve your pain Continue to practice good hand hygiene and respiratory hygiene for the rest of the flu season Drink plenty of fluids and stay well hydrated  Follow-up with cardiology and urology as planned Follow-up with neurology as planned

## 2016-09-05 NOTE — Progress Notes (Signed)
Subjective:    Patient ID: Keith Hill, male    DOB: [birth year], 83 y.o.   MRN: [id number]  HPI Pt here for follow up and management of chronic medical problems which includes hyperlipidemia and hypertension. He is taking medications regularly.The patient continues to have ongoing issues with his back. He is seen orthopedic specialist. One on for a good while. Back in November 2016 he did have an MRI of his back. This basically showed lumbar disc and facet degeneration resulting in lateral recess stenosis as above greatest at L5 and S1. No evidence of metastatic disease on that study. There is mild disc bulging at several levels with inflammation. His L5-S1 there was some mild facet hypertrophy resulting in moderate right greater than left lateral stenosis and mild right neural foraminal stenosis without significant spinal stenosis. The patient will be given a copy of this report. He has seen the neurologist recently about his Parkinson's disease and the neurologist is very pleased with the patient's progress with this. He also had community-acquired pneumonia back in the fall. The patient also continues to be followed regularly by his cardiologist, Dr. [place] Lawman. He had a rectal exam by the urologist in October. He is had lab work done and these results will be reviewed with him during the visit today. His CHOLESTEROL numbers were excellent. His blood sugar was good at 89 his creatinine, the most important kidney function test was within normal limits. Electrolytes including potassium are good except the serum sodium was minimally elevated. The hemoglobin was good at 13.1 and the platelet count and white blood cell count were within normal limits. All liver function tests were normal. The vitamin D level was good. The patient's main complaint now is basically his low back pain and he was given a copy of this report from the MRI that was done a year ago. He is requesting another opinion from someone at [facility] [place]. As mentioned he denies any chest pain or shortness of breath. He denies any trouble with his stomach including nausea vomiting diarrhea blood in the stool or black tarry bowel movements. He is passing his water well. He is recently seen the cardiologist and it was decided between he and the cardiologist that he would stay in atrial fibrillation and continue to take his blood thinner. He has no problems with this as he is not aware that he is in atrial fibrillation.     Patient Active Problem List   Diagnosis Date Noted  . Back pain 11/18/2013  . Vitamin D deficiency 08/28/2013  . Hiatal hernia with gastroesophageal reflux 08/28/2013  . CAD (coronary artery disease) 04/08/2013  . Essential and other specified forms of tremor 03/31/2013  . Parkinson's 11/07/2012  . Hemorrhoids 02/17/2011  . Hyperlipidemia with target LDL less than 70   . Prostate cancer ([place])   . Hypertension   . Allergic rhinitis   . Colon polyps, history of    Outpatient Encounter Prescriptions as of 09/05/2016  Medication Sig  . amLODipine (NORVASC) 10 MG tablet TAKE (1/2) TABLET DAILY.  Marland Kitchen atorvastatin (LIPITOR) 40 MG tablet TAKE ONE TABLET AT BEDTIME  . carbidopa-levodopa (SINEMET IR) 25-100 MG tablet 1.5 tablets in the morning and 1 tablet at noon and evening  . Cholecalciferol (VITAMIN D3) 5000 UNITS CAPS Take 1 capsule by mouth 4 (four) times a week. Reported on 09/14/2015  . docusate sodium (COLACE) 100 MG capsule Take 100 mg by mouth 2 (two) times daily.  daily  . ELIQUIS 5 MG TABS tablet TAKE  (1)  TABLET TWICE A DAY.  . fluticasone (FLONASE) 50 MCG/ACT nasal spray Place into both nostrils daily.  . FOLGARD RX 2.2-25-1 MG TABS TAKE 1 TABLET DAILY  . Krill Oil 300 MG CAPS Take by mouth daily. Reported on 09/14/2015  . metoprolol (LOPRESSOR) 50 MG tablet Take 50 mg by mouth 2 (two) times daily.  . Probiotic Product ([place]) Take by mouth 1 dose over  24 hours.    . rasagiline (AZILECT) 1 MG TABS tablet Take 1 tablet (1 mg total) by mouth daily.  Marland Kitchen terazosin (HYTRIN) 2 MG capsule TAKE (1) CAPSULE DAILY AT BEDTIME.  . valsartan-hydrochlorothiazide (DIOVAN-HCT) 320-25 MG tablet TAKE 1 TABLET ONCE A DAY (Patient taking differently: TAKE 1/4 TABLET ONCE A DAY)  . [DISCONTINUED] oxymetazoline (AFRIN) 0.05 % nasal spray Place 1 spray into both nostrils 2 (two) times daily.   No facility-administered encounter medications on file as of 09/05/2016.       Review of Systems  Constitutional: Negative.   HENT: Negative.   Eyes: Negative.   Respiratory: Negative.   Cardiovascular: Negative.   Gastrointestinal: Negative.   Endocrine: Negative.   Genitourinary: Negative.   Musculoskeletal: Positive for back pain.  Skin: Negative.   Allergic/Immunologic: Negative.   Neurological: Negative.   Hematological: Negative.   Psychiatric/Behavioral: Negative.        Objective:   Physical Exam  Constitutional: He is oriented to person, place, and time. He appears well-developed and well-nourished.  HENT:  Head: Normocephalic and atraumatic.  Right Ear: External ear normal.  Left Ear: External ear normal.  Nose: Nose normal.  Mouth/Throat: Oropharynx is clear and moist. No oropharyngeal exudate.  Eyes: Conjunctivae and EOM are normal. Pupils are equal, round, and reactive to light. Right eye exhibits no discharge. Left eye exhibits no discharge. No scleral icterus.  Neck: Normal range of motion. Neck supple. No tracheal deviation present. No thyromegaly present.  Cardiovascular: Normal rate, regular rhythm, normal heart sounds and intact distal pulses.  Exam reveals no gallop and no friction rub.   No murmur heard. Pulmonary/Chest: Effort normal and breath sounds normal. No respiratory distress. He has no wheezes. He has no rales. He exhibits no tenderness.  Abdominal: Soft. Bowel sounds are normal. He exhibits no mass. There is no tenderness.  There is no rebound and no guarding.  Genitourinary: Rectum normal, prostate normal and penis normal.  Musculoskeletal: Normal range of motion. He exhibits no edema or tenderness.  Lymphadenopathy:    He has no cervical adenopathy.  Neurological: He is alert and oriented to person, place, and time. He has normal reflexes. No cranial nerve deficit.  Skin: Skin is warm and dry. No rash noted. No erythema. No pallor.  Psychiatric: He has a normal mood and affect. His behavior is normal. Judgment and thought content normal.  Nursing note and vitals reviewed.   BP (!) 103/55 (BP Location: Left Arm)   Pulse 90   Temp 97.5 F (36.4 C) (Oral)   Ht 5' 8.5" (1.74 m)   Wt 168 lb (76.2 kg)   BMI 25.17 kg/m        Assessment & Plan:  1. Vitamin D deficiency -Continue current treatment as doing his vitamin D level was within normal limits  2. Hypertension -Blood pressure is good and he will continue with current treatment  3. Pure hypercholesterolemia -Cholesterol numbers were excellent and he will continue with current treatment  4.  Prostate cancer [facility]) -Follow-up with urology as planned  5. Chronic low back pain, unspecified back pain laterality, with sciatica presence unspecified - Ambulatory referral to Neurosurgery -Continue exercise regimen  6. Parkinson's disease -Follow-up with neurology as planned  7. Atrial fibrillation -Continue with blood thinner and follow-up with cardiology as planned this summer  Patient Instructions                       Medicare Annual Wellness Visit  [place] and the medical providers at [facility] strive to bring you the best medical care.  In doing so we not only want to address your current medical conditions and concerns but also to detect new conditions early and prevent illness, disease and health-related problems.    Medicare offers a yearly Wellness Visit which allows our clinical staff to assess your need for  preventative services including immunizations, lifestyle education, counseling to decrease risk of preventable diseases and screening for fall risk and other medical concerns.    This visit is provided free of charge (no copay) for all Medicare recipients. The clinical pharmacists at [facility] have begun to conduct these Wellness Visits which will also include a thorough review of all your medications.    As you primary medical provider recommend that you make an appointment for your Annual Wellness Visit if you have not done so already this year.  You may set up this appointment before you leave today or you may call back [id number] and schedule an appointment.  Please make sure when you call that you mention that you are scheduling your Annual Wellness Visit with the clinical pharmacist so that the appointment may be made for the proper length of time.     Continue current medications. Continue good therapeutic lifestyle changes which include good diet and exercise. Fall precautions discussed with patient. If an FOBT was given today- please return it to our front desk. If you are over 47 years old - you may need Prevnar 58 or the adult Pneumonia vaccine.  **Flu shots are available--- please call and schedule a FLU-CLINIC appointment**  After your visit with [place] today you will receive a survey in the mail or online from [employer] regarding your care with [place]. Please take a moment to fill this out. Your feedback is very important to [place] as you can help [place] better understand your patient needs as well as improve your experience and satisfaction. WE CARE ABOUT YOU!!!   We will do a referral for you to Dr. branch who is in neurosurgery at [facility] to further evaluate your back and see if there are other options for helping relieve your pain Continue to practice good hand hygiene and respiratory hygiene for the rest of the flu season Drink  plenty of fluids and stay well hydrated  Follow-up with cardiology and urology as planned Follow-up with neurology as planned  Arrie Senate MD

## 2016-10-02 ENCOUNTER — Other Ambulatory Visit: Payer: Self-pay | Admitting: Family Medicine

## 2016-10-03 DIAGNOSIS — Z886 Allergy status to analgesic agent status: Secondary | ICD-10-CM | POA: Diagnosis not present

## 2016-10-03 DIAGNOSIS — G8929 Other chronic pain: Secondary | ICD-10-CM | POA: Diagnosis not present

## 2016-10-03 DIAGNOSIS — I10 Essential (primary) hypertension: Secondary | ICD-10-CM | POA: Diagnosis not present

## 2016-10-03 DIAGNOSIS — Z888 Allergy status to other drugs, medicaments and biological substances status: Secondary | ICD-10-CM | POA: Diagnosis not present

## 2016-10-03 DIAGNOSIS — M545 Low back pain: Secondary | ICD-10-CM | POA: Diagnosis not present

## 2016-10-03 DIAGNOSIS — E785 Hyperlipidemia, unspecified: Secondary | ICD-10-CM | POA: Diagnosis not present

## 2016-10-03 DIAGNOSIS — Z79899 Other long term (current) drug therapy: Secondary | ICD-10-CM | POA: Diagnosis not present

## 2016-10-03 DIAGNOSIS — Z882 Allergy status to sulfonamides status: Secondary | ICD-10-CM | POA: Diagnosis not present

## 2016-10-23 ENCOUNTER — Other Ambulatory Visit: Payer: Self-pay | Admitting: Family Medicine

## 2016-10-30 ENCOUNTER — Other Ambulatory Visit: Payer: Self-pay | Admitting: Family Medicine

## 2016-11-15 DIAGNOSIS — M47816 Spondylosis without myelopathy or radiculopathy, lumbar region: Secondary | ICD-10-CM | POA: Diagnosis not present

## 2016-11-15 DIAGNOSIS — M4696 Unspecified inflammatory spondylopathy, lumbar region: Secondary | ICD-10-CM | POA: Diagnosis not present

## 2016-12-05 DIAGNOSIS — M47816 Spondylosis without myelopathy or radiculopathy, lumbar region: Secondary | ICD-10-CM | POA: Insufficient documentation

## 2016-12-13 ENCOUNTER — Ambulatory Visit (INDEPENDENT_AMBULATORY_CARE_PROVIDER_SITE_OTHER): Payer: Medicare Other | Admitting: *Deleted

## 2016-12-13 VITALS — BP 131/80 | HR 56 | Temp 97.3°F | Ht 68.25 in | Wt 165.0 lb

## 2016-12-13 DIAGNOSIS — Z0000 Encounter for general adult medical examination without abnormal findings: Secondary | ICD-10-CM

## 2016-12-13 NOTE — Patient Instructions (Signed)
  [name] , Thank you for taking time to come for your Medicare Wellness Visit. I appreciate your ongoing commitment to your health goals. Please review the following plan we discussed and let me know if I can assist you in the future.   These are the goals we discussed: Goals    . Have 3 meals a day    . Weight (lb) < 155 lb (70.3 kg)       This is a list of the screening recommended for you and due dates:  Health Maintenance  Topic Date Due  . Flu Shot  03/21/2017  . Tetanus Vaccine  05/24/2021  . Pneumonia vaccines  Completed   Bring [place] a copy of the advanced directives  Keep follow up with Dr Laurance Flatten and specialist

## 2016-12-13 NOTE — Progress Notes (Signed)
Subjective:   Keith Hill is a 83 y.o. male who presents for Medicare Annual/Subsequent preventive examination.   Patient here today for medicare annual wellness visit. He is a 83 year old who is accompanied today by his wife. They live here in [place] and they have one son who also lived locally. 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 He enjoys playing golf about once a week and he goes to therapy 3 times per week for exercise. He and his wife eat 2-3 semi heathy meals a day and they usually snack at bedtime. He is a decan in [employer] and they attend 1-2 times per week. Fall hazards were discussed today and he states his health is about the same as it was a year ago.        Objective:    Vitals: BP 131/80 (BP Location: Left Arm)   Pulse (!) 56   Temp 97.3 F (36.3 C) (Oral)   Ht 5' 8.25" (1.734 m)   Wt 165 lb (74.8 kg)   BMI 24.90 kg/m   Body mass index is 24.9 kg/m.  Tobacco History  Smoking Status  . Never Smoker  Smokeless Tobacco  . Never Used     Counseling given: Not Answered pt has never used any tobacco products  Past Medical History:  Diagnosis Date  . Allergic rhinitis, cause unspecified   . Cardiac dysrhythmia, unspecified   . Colon polyps   . Essential and other specified forms of tremor   . GERD (gastroesophageal reflux disease)   . Hemorrhoids, external   . Hiatal hernia   . Hyperlipidemia   . Hyperplasia of prostate   . Hypertension   . Lumbar disc disease   . Pancreatitis 1976  . Paralysis agitans ([place]) 11/07/2012  . Parkinson disease ([place])   . Peyronie's disease   . Prostate cancer [facility])    prostate   Past Surgical History:  Procedure Laterality Date  . HEMORRHOID SURGERY    . KNEE SURGERY Left   . PROSTATE BIOPSY  04/03/2013  . PROSTATE BIOPSY  11/01/2011  . SHOULDER SURGERY Left    Family History  Problem Relation Age of Onset  . Kidney failure  Father   . Alcoholism Brother   . Heart attack Brother   . Hyperlipidemia Son   . Cancer Neg Hx    History  Sexual Activity  . Sexual activity: Yes    Outpatient Encounter Prescriptions as of 12/13/2016  Medication Sig  . amLODipine (NORVASC) 10 MG tablet TAKE (1/2) TABLET DAILY.  Marland Kitchen atorvastatin (LIPITOR) 40 MG tablet TAKE ONE TABLET AT BEDTIME  . carbidopa-levodopa (SINEMET IR) 25-100 MG tablet 1.5 tablets in the morning and 1 tablet at noon and evening  . Cholecalciferol (VITAMIN D3) 5000 UNITS CAPS Take 1 capsule by mouth 4 (four) times a week. Reported on 09/14/2015  . docusate sodium (COLACE) 100 MG capsule Take 100 mg by mouth 2 (two) times daily. daily  . doxylamine, Sleep, (UNISOM) 25 MG tablet Take 12.5 mg by mouth at bedtime as needed.  Marland Kitchen ELIQUIS 5 MG TABS tablet TAKE (1) TABLET TWICE A DAY.  . fluticasone (FLONASE) 50 MCG/ACT nasal spray Place into both nostrils daily.  . FOLGARD RX 2.2-25-1 MG TABS TAKE 1 TABLET DAILY  . guaiFENesin (MUCINEX) 600 MG 12 hr tablet Take 600 mg by mouth 2 (two) times daily as needed.  Marland Kitchen  Krill Oil 300 MG CAPS Take by mouth daily. Reported on 09/14/2015  . Magnesium 250 MG TABS Take 1 tablet by mouth at bedtime as needed.  . metoprolol (LOPRESSOR) 50 MG tablet Take 50 mg by mouth daily.   . Probiotic Product ([place]) Take by mouth 1 dose over 24 hours.    . rasagiline (AZILECT) 1 MG TABS tablet Take 1 tablet (1 mg total) by mouth daily.  Marland Kitchen terazosin (HYTRIN) 2 MG capsule TAKE (1) CAPSULE DAILY AT BEDTIME.  . valsartan-hydrochlorothiazide (DIOVAN-HCT) 320-25 MG tablet TAKE 1 TABLET ONCE A DAY (Patient taking differently: take 2 tabs a week - he cuts them down)   No facility-administered encounter medications on file as of 12/13/2016.     Activities of Daily Living In your present state of health, do you have any difficulty performing the following activities: 12/13/2016  Hearing? N  Vision? N  Difficulty concentrating or making  decisions? N  Walking or climbing stairs? N  Dressing or bathing? N  Doing errands, shopping? N  Some recent data might be hidden    Patient Care Team: Chipper Herb, MD as PCP - General (Family Medicine) Kathrynn Ducking, MD as Consulting Physician (Neurology) Clarene Essex, MD as Consulting Physician (Gastroenterology) Irine Seal, MD as Consulting Physician (Urology) Jacolyn Reedy, MD as Consulting Physician (Cardiology) Suella Broad, MD as Consulting Physician (Physical Medicine and Rehabilitation) Langley Gauss, MD as Referring Physician (Pain Medicine) Michel Harrow, [place] as Referring Physician (Chiropractic Medicine)   Assessment:    Exercise Activities and Dietary recommendations    Goals    . Have 3 meals a day    . Weight (lb) < 155 lb (70.3 kg)      Fall Risk Fall Risk  12/13/2016 09/05/2016 06/19/2016 05/29/2016 05/02/2016  Falls in the past year? No No No No No  Number falls in past yr: - - - - -  Injury with Fall? - - - - -  Follow up - - - - -   Depression Screen PHQ 2/9 Scores 12/13/2016 09/05/2016 06/19/2016 05/29/2016  PHQ - 2 Score 0 0 0 0    Cognitive Function MMSE - Mini Mental State Exam 12/13/2016 11/19/2014  Orientation to time 5 5  Orientation to Place 5 5  Registration 3 3  Attention/ Calculation 5 5  Recall 3 3  Language- name 2 objects 2 2  Language- repeat 1 1  Language- follow 3 step command 3 3  Language- read & follow direction 1 1  Write a sentence 1 1  Copy design 1 1  Total score 30 30    score today is 30/30.    Immunization History  Administered Date(s) Administered  . Influenza Whole 04/21/2010  . Influenza, High Dose Seasonal PF 05/18/2016  . Influenza,inj,Quad PF,36+ Mos 05/29/2013, 06/01/2014, 06/01/2015  . Pneumococcal Conjugate-13 08/28/2013  . Pneumococcal Polysaccharide-23 07/21/2006  . Td 10/26/2008  . Tdap 05/25/2011  . Zoster 07/25/2006   Screening Tests Health Maintenance  Topic Date Due  .  INFLUENZA VACCINE  03/21/2017  . TETANUS/TDAP  05/24/2021  . PNA vac Low Risk Adult  Completed      Plan:     He is currently have papers drawn up for will and advanced directives = he will bring a copy by here once they are finished. He will keep follow up appt with Dr Laurance Flatten and other specialist   During the course of the visit the patient was educated and  counseled about the following appropriate screening and preventive services:   Vaccines to include Pneumoccal, Influenza, Hepatitis B, Td, Zostavax, HCV  Electrocardiogram  Cardiovascular Disease  Colorectal cancer screening  Diabetes screening  Prostate Cancer Screening  Glaucoma screening  Nutrition counseling   Smoking cessation counseling  Patient Instructions (the written plan) was given to the patient.    Vaughn Beaumier, Cameron Proud, LPN  0/71/2197  I have reviewed and agree with the above AWV documentation.   Mary-Margaret Hassell Done, FNP

## 2016-12-14 DIAGNOSIS — C61 Malignant neoplasm of prostate: Secondary | ICD-10-CM | POA: Diagnosis not present

## 2016-12-21 DIAGNOSIS — R351 Nocturia: Secondary | ICD-10-CM | POA: Diagnosis not present

## 2016-12-21 DIAGNOSIS — N403 Nodular prostate with lower urinary tract symptoms: Secondary | ICD-10-CM | POA: Diagnosis not present

## 2016-12-21 DIAGNOSIS — C61 Malignant neoplasm of prostate: Secondary | ICD-10-CM | POA: Diagnosis not present

## 2016-12-28 ENCOUNTER — Other Ambulatory Visit: Payer: Medicare Other

## 2016-12-28 DIAGNOSIS — G20 Parkinson's disease: Secondary | ICD-10-CM

## 2016-12-28 DIAGNOSIS — C61 Malignant neoplasm of prostate: Secondary | ICD-10-CM

## 2016-12-28 DIAGNOSIS — I4891 Unspecified atrial fibrillation: Secondary | ICD-10-CM | POA: Diagnosis not present

## 2016-12-28 DIAGNOSIS — E7800 Pure hypercholesterolemia, unspecified: Secondary | ICD-10-CM

## 2016-12-28 DIAGNOSIS — M47816 Spondylosis without myelopathy or radiculopathy, lumbar region: Secondary | ICD-10-CM | POA: Diagnosis not present

## 2016-12-28 DIAGNOSIS — I10 Essential (primary) hypertension: Secondary | ICD-10-CM | POA: Diagnosis not present

## 2016-12-28 DIAGNOSIS — M545 Low back pain: Secondary | ICD-10-CM | POA: Diagnosis not present

## 2016-12-28 DIAGNOSIS — E559 Vitamin D deficiency, unspecified: Secondary | ICD-10-CM

## 2016-12-28 DIAGNOSIS — M4696 Unspecified inflammatory spondylopathy, lumbar region: Secondary | ICD-10-CM | POA: Diagnosis not present

## 2016-12-29 LAB — HEPATIC FUNCTION PANEL
ALBUMIN: 4.3 g/dL (ref 3.5–4.7)
ALK PHOS: 81 IU/L (ref 39–117)
ALT: 37 IU/L (ref 0–44)
AST: 32 IU/L (ref 0–40)
BILIRUBIN, DIRECT: 0.21 mg/dL (ref 0.00–0.40)
Bilirubin Total: 0.6 mg/dL (ref 0.0–1.2)
TOTAL PROTEIN: 7 g/dL (ref 6.0–8.5)

## 2016-12-29 LAB — CBC WITH DIFFERENTIAL/PLATELET
Basophils Absolute: 0.1 10*3/uL (ref 0.0–0.2)
Basos: 1 %
EOS (ABSOLUTE): 0.2 10*3/uL (ref 0.0–0.4)
EOS: 3 %
HEMOGLOBIN: 13.7 g/dL (ref 13.0–17.7)
Hematocrit: 39.5 % (ref 37.5–51.0)
IMMATURE GRANS (ABS): 0 10*3/uL (ref 0.0–0.1)
Immature Granulocytes: 0 %
LYMPHS ABS: 1.7 10*3/uL (ref 0.7–3.1)
Lymphs: 26 %
MCH: 34.3 pg — AB (ref 26.6–33.0)
MCHC: 34.7 g/dL (ref 31.5–35.7)
MCV: 99 fL — ABNORMAL HIGH (ref 79–97)
MONOCYTES: 8 %
Monocytes Absolute: 0.6 10*3/uL (ref 0.1–0.9)
NEUTROS ABS: 4.1 10*3/uL (ref 1.4–7.0)
Neutrophils: 62 %
Platelets: 206 10*3/uL (ref 150–379)
RBC: 3.99 x10E6/uL — ABNORMAL LOW (ref 4.14–5.80)
RDW: 14.5 % (ref 12.3–15.4)
WBC: 6.6 10*3/uL (ref 3.4–10.8)

## 2016-12-29 LAB — BMP8+EGFR
BUN / CREAT RATIO: 19 (ref 10–24)
BUN: 27 mg/dL (ref 8–27)
CO2: 24 mmol/L (ref 18–29)
CREATININE: 1.42 mg/dL — AB (ref 0.76–1.27)
Calcium: 9.2 mg/dL (ref 8.6–10.2)
Chloride: 103 mmol/L (ref 96–106)
GFR calc Af Amer: 52 mL/min/{1.73_m2} — ABNORMAL LOW (ref >59)
GFR calc non Af Amer: 45 mL/min/{1.73_m2} — ABNORMAL LOW (ref >59)
GLUCOSE: 94 mg/dL (ref 65–99)
Potassium: 4 mmol/L (ref 3.5–5.2)
SODIUM: 145 mmol/L — AB (ref 134–144)

## 2016-12-29 LAB — NMR, LIPOPROFILE
Cholesterol: 119 mg/dL (ref 100–199)
HDL Cholesterol by NMR: 53 mg/dL (ref >39)
HDL PARTICLE NUMBER: 32.6 umol/L (ref >=30.5)
LDL Particle Number: 627 nmol/L (ref <1000)
LDL SIZE: 20.7 nm (ref >20.5)
LDL-C: 56 mg/dL (ref 0–99)
LP-IR Score: 31 (ref <=45)
SMALL LDL PARTICLE NUMBER: 291 nmol/L (ref <=527)
TRIGLYCERIDES BY NMR: 48 mg/dL (ref 0–149)

## 2016-12-29 LAB — VITAMIN D 25 HYDROXY (VIT D DEFICIENCY, FRACTURES): VIT D 25 HYDROXY: 49.4 ng/mL (ref 30.0–100.0)

## 2017-01-01 ENCOUNTER — Other Ambulatory Visit: Payer: Self-pay | Admitting: Family Medicine

## 2017-01-04 ENCOUNTER — Ambulatory Visit (INDEPENDENT_AMBULATORY_CARE_PROVIDER_SITE_OTHER): Payer: Medicare Other | Admitting: Family Medicine

## 2017-01-04 ENCOUNTER — Encounter: Payer: Self-pay | Admitting: Family Medicine

## 2017-01-04 VITALS — BP 94/52 | HR 66 | Temp 97.1°F | Ht 68.25 in | Wt 163.0 lb

## 2017-01-04 DIAGNOSIS — G20 Parkinson's disease: Secondary | ICD-10-CM

## 2017-01-04 DIAGNOSIS — I4819 Other persistent atrial fibrillation: Secondary | ICD-10-CM

## 2017-01-04 DIAGNOSIS — E559 Vitamin D deficiency, unspecified: Secondary | ICD-10-CM | POA: Diagnosis not present

## 2017-01-04 DIAGNOSIS — M545 Low back pain: Secondary | ICD-10-CM

## 2017-01-04 DIAGNOSIS — N183 Chronic kidney disease, stage 3 unspecified: Secondary | ICD-10-CM

## 2017-01-04 DIAGNOSIS — I1310 Hypertensive heart and chronic kidney disease without heart failure, with stage 1 through stage 4 chronic kidney disease, or unspecified chronic kidney disease: Secondary | ICD-10-CM | POA: Diagnosis not present

## 2017-01-04 DIAGNOSIS — I10 Essential (primary) hypertension: Secondary | ICD-10-CM | POA: Diagnosis not present

## 2017-01-04 DIAGNOSIS — C61 Malignant neoplasm of prostate: Secondary | ICD-10-CM | POA: Diagnosis not present

## 2017-01-04 DIAGNOSIS — G8929 Other chronic pain: Secondary | ICD-10-CM | POA: Diagnosis not present

## 2017-01-04 DIAGNOSIS — I481 Persistent atrial fibrillation: Secondary | ICD-10-CM | POA: Diagnosis not present

## 2017-01-04 DIAGNOSIS — E7800 Pure hypercholesterolemia, unspecified: Secondary | ICD-10-CM

## 2017-01-04 DIAGNOSIS — I4891 Unspecified atrial fibrillation: Secondary | ICD-10-CM | POA: Diagnosis not present

## 2017-01-04 DIAGNOSIS — G20A1 Parkinson's disease without dyskinesia, without mention of fluctuations: Secondary | ICD-10-CM

## 2017-01-04 NOTE — Patient Instructions (Addendum)
Medicare Annual Wellness Visit  [facility] and the medical providers at [facility] strive to bring you the best medical care.  In doing so we not only want to address your current medical conditions and concerns but also to detect new conditions early and prevent illness, disease and health-related problems.    Medicare offers a yearly Wellness Visit which allows our clinical staff to assess your need for preventative services including immunizations, lifestyle education, counseling to decrease risk of preventable diseases and screening for fall risk and other medical concerns.    This visit is provided free of charge (no copay) for all Medicare recipients. The clinical pharmacists at [facility] have begun to conduct these Wellness Visits which will also include a thorough review of all your medications.    As you primary medical provider recommend that you make an appointment for your Annual Wellness Visit if you have not done so already this year.  You may set up this appointment before you leave today or you may call back ([phone]) and schedule an appointment.  Please make sure when you call that you mention that you are scheduling your Annual Wellness Visit with the clinical pharmacist so that the appointment may be made for the proper length of time.     Continue current medications. Continue good therapeutic lifestyle changes which include good diet and exercise. Fall precautions discussed with patient. If an FOBT was given today- please return it to our front desk. If you are over 67 years old - you may need Prevnar 94 or the adult Pneumonia vaccine.  **Flu shots are available--- please call and schedule a FLU-CLINIC appointment**  After your visit with [place] today you will receive a survey in the mail or online from [employer] regarding your care with [place]. Please take a moment to fill this out. Your feedback is very  important to [place] as you can help [place] better understand your patient needs as well as improve your experience and satisfaction. WE CARE ABOUT YOU!!!   Keep follow up with Dr Wynonia Lawman and Dr Jannifer Franklin

## 2017-01-04 NOTE — Progress Notes (Signed)
Subjective:    Patient ID: Keith Hill, male    DOB: Feb 28, 1932, 83 y.o.   MRN: [id number]  HPI Pt here for follow up and management of chronic medical problems which includes hyperlipidemia and hypertension. He is taking medication regularly.This patient has prostate cancer and is followed regularly by the urologist. He also has Parkinson's disease and is followed regularly by the neurologist. He is recently been to see a back specialist in [place]. He is had lab work done and this will be reviewed with him during the visit today. Cholesterol numbers with advanced lipid testing were excellent with an LDL C of 56. He will continue with his current treatment for this. The CBC had a normal white blood cell count and a good hemoglobin at 13.7 with an adequate platelet count. The blood sugar was good at 94. The creatinine however was slightly more elevated this time at 1.42 and we will remind him to stay away from all NSAIDs like ibuprofen and Aleve. The serum sodium was slightly elevated and similar to what it was 4 months ago at 145. The potassium and remainder of electrolytes were good. All liver function tests were normal. The vitamin D level was good at 49.4 and he will continue with current treatment. The patient is followed by the cardiologist and he has had new onset atrial fibrillation and is taking Eliquis for this.The patient denies any chest pain or shortness of breath anymore than usual. He denies any trouble with his stomach including nausea vomiting diarrhea blood in the stool or black tarry bowel movements. He is passing his water without problems. He is been to see the urologist recently and his PSA is back down and he is very pleased with that. He follows regularly with Dr. Jannifer Franklin. This is for his Parkinson's. He also sees Dr. Wynonia Lawman for his heart problems. He is now in atrophia and taking the blood thinner and not having any problems with this and still playing golf. He does complain of  ongoing problems with his back and has had several injections from a couple of different physician offices is still having problems when he is up on his feet for any period of time. He does have a return appointment to follow-up on this soon.      Patient Active Problem List   Diagnosis Date Noted  . Back pain 11/18/2013  . Vitamin D deficiency 08/28/2013  . Hiatal hernia with gastroesophageal reflux 08/28/2013  . CAD (coronary artery disease) 04/08/2013  . Essential and other specified forms of tremor 03/31/2013  . Parkinson's 11/07/2012  . Hemorrhoids 02/17/2011  . Hyperlipidemia with target LDL less than 70   . Prostate cancer ([place])   . Hypertension   . Allergic rhinitis   . Colon polyps, history of    Outpatient Encounter Prescriptions as of 01/04/2017  Medication Sig  . amLODipine (NORVASC) 10 MG tablet TAKE (1/2) TABLET DAILY.  Marland Kitchen atorvastatin (LIPITOR) 40 MG tablet TAKE ONE TABLET AT BEDTIME  . carbidopa-levodopa (SINEMET IR) 25-100 MG tablet 1.5 tablets in the morning and 1 tablet at noon and evening  . Cholecalciferol (VITAMIN D3) 5000 UNITS CAPS Take 1 capsule by mouth 4 (four) times a week. Reported on 09/14/2015  . docusate sodium (COLACE) 100 MG capsule Take 100 mg by mouth 2 (two) times daily. daily  . doxylamine, Sleep, (UNISOM) 25 MG tablet Take 12.5 mg by mouth at bedtime as needed.  Marland Kitchen ELIQUIS 5 MG TABS tablet TAKE (1) TABLET TWICE  A DAY.  . fluticasone (FLONASE) 50 MCG/ACT nasal spray Place into both nostrils daily.  . FOLGARD RX 2.2-25-1 MG TABS TAKE 1 TABLET DAILY  . guaiFENesin (MUCINEX) 600 MG 12 hr tablet Take 600 mg by mouth 2 (two) times daily as needed.  [name] Oil 300 MG CAPS Take 500 mg by mouth daily. Reported on 09/14/2015  . Magnesium 250 MG TABS Take 2 tablets by mouth at bedtime as needed.   . metoprolol (LOPRESSOR) 50 MG tablet Take 50 mg by mouth daily.   . Probiotic Product ([place]) Take by mouth 1 dose over 24 hours.    .  rasagiline (AZILECT) 1 MG TABS tablet Take 1 tablet (1 mg total) by mouth daily.  Marland Kitchen terazosin (HYTRIN) 2 MG capsule TAKE (1) CAPSULE DAILY AT BEDTIME.  . valsartan-hydrochlorothiazide (DIOVAN-HCT) 320-25 MG tablet TAKE 1 TABLET ONCE A DAY (Patient taking differently: take 2 tabs a week - he cuts them down)   No facility-administered encounter medications on file as of 01/04/2017.      Review of Systems  Constitutional: Negative.   HENT: Negative.   Eyes: Negative.   Respiratory: Negative.   Cardiovascular: Negative.   Gastrointestinal: Negative.   Endocrine: Negative.   Genitourinary: Negative.   Musculoskeletal: Positive for back pain (had several injections).  Skin: Negative.   Allergic/Immunologic: Negative.   Neurological: Negative.   Hematological: Negative.   Psychiatric/Behavioral: Negative.        Objective:   Physical Exam  Constitutional: He is oriented to person, place, and time. He appears well-developed and well-nourished.  The patient is pleasant alert and calm.  HENT:  Head: Normocephalic and atraumatic.  Right Ear: External ear normal.  Left Ear: External ear normal.  Nose: Nose normal.  Mouth/Throat: Oropharynx is clear and moist. No oropharyngeal exudate.  Eyes: Conjunctivae and EOM are normal. Pupils are equal, round, and reactive to light. Right eye exhibits no discharge. Left eye exhibits no discharge. No scleral icterus.  Neck: Normal range of motion. Neck supple. No thyromegaly present.  No bruits thyromegaly or anterior cervical adenopathy  Cardiovascular: Normal rate, normal heart sounds and intact distal pulses.   No murmur heard. The heart is irregular irregular at 72/m  Pulmonary/Chest: Effort normal and breath sounds normal. No respiratory distress. He has no wheezes. He has no rales. He exhibits no tenderness.  Clear anteriorly and posteriorly and no axillary adenopathy  Abdominal: Soft. Bowel sounds are normal. He exhibits no mass. There is no  tenderness. There is no rebound and no guarding.  No abdominal tenderness or organ enlargement bruits inguinal adenopathy or masses  Genitourinary:  Genitourinary Comments: The patient is followed regularly by Dr. Jeffie Pollock and was just recently seen.  Musculoskeletal: Normal range of motion. He exhibits no edema.  Patient had good range of motion while in the office but does admit to more problems if he is on his feet with his back for an extended period of time.  Lymphadenopathy:    He has no cervical adenopathy.  Neurological: He is alert and oriented to person, place, and time. He has normal reflexes. No cranial nerve deficit.  No obvious tremor noted during the visit today. This is great.  Skin: Skin is warm and dry. No rash noted.  Psychiatric: He has a normal mood and affect. His behavior is normal. Judgment and thought content normal.  Nursing note and vitals reviewed.   BP (!) 94/52 (BP Location: Left Arm)   Pulse 66  Temp 97.1 F (36.2 C) (Oral)   Ht 5' 8.25" (1.734 m)   Wt 163 lb (73.9 kg)   BMI 24.60 kg/m        Assessment & Plan:  1. Vitamin D deficiency -The vitamin D level was good and the patient will continue with current treatment  2. Hypertension -The blood pressure was repeated after the initial first low blood pressure and the repeat was 120/60 in the right arm with the patient sitting with a regular cuff.  3. Pure hypercholesterolemia -All cholesterol numbers were excellent and the patient will continue with current treatment  4. Prostate cancer ([place]) -Continue to follow-up with urology as planned  5. Parkinson's -Continue to follow-up with neurology as planned  6. Atrial fibrillation, unspecified type ([place]) -Continue with metoprolol and blood thinner as doing and follow-up with cardiology as planned  7. Chronic low back pain, unspecified back pain laterality, with sciatica presence unspecified -Follow-up with orthopedic and spine specialist at [facility]  8. Persistent atrial fibrillation ([facility]) -Continue with Eliquis and metoprolol for rate control and follow-up with cardiology as planned  9. Parkinson disease [facility]) -The patient was doing well with his Parkinson's, he stays active and he follows up regularly with his neurologist.  Patient Instructions                       Medicare Annual Wellness Visit  [facility] and the medical providers at [facility] strive to bring you the best medical care.  In doing so we not only want to address your current medical conditions and concerns but also to detect new conditions early and prevent illness, disease and health-related problems.    Medicare offers a yearly Wellness Visit which allows our clinical staff to assess your need for preventative services including immunizations, lifestyle education, counseling to decrease risk of preventable diseases and screening for fall risk and other medical concerns.    This visit is provided free of charge (no copay) for all Medicare recipients. The clinical pharmacists at [facility] have begun to conduct these Wellness Visits which will also include a thorough review of all your medications.    As you primary medical provider recommend that you make an appointment for your Annual Wellness Visit if you have not done so already this year.  You may set up this appointment before you leave today or you may call back ([phone]) and schedule an appointment.  Please make sure when you call that you mention that you are scheduling your Annual Wellness Visit with the clinical pharmacist so that the appointment may be made for the proper length of time.     Continue current medications. Continue good therapeutic lifestyle changes which include good diet and exercise. Fall precautions discussed with patient. If an FOBT was given today- please return it to our front desk. If you are  over 42 years old - you may need Prevnar 16 or the adult Pneumonia vaccine.  **Flu shots are available--- please call and schedule a FLU-CLINIC appointment**  After your visit with [place] today you will receive a survey in the mail or online from [employer] regarding your care with [place]. Please take a moment to fill this out. Your feedback is very important to [place] as you can help [place] better understand your patient needs as well as improve your experience and satisfaction. WE CARE ABOUT YOU!!!   Keep follow up with Dr  Wynonia Lawman and Dr Jannifer Franklin     Arrie Senate MD

## 2017-01-09 DIAGNOSIS — M545 Low back pain: Secondary | ICD-10-CM | POA: Diagnosis not present

## 2017-01-09 DIAGNOSIS — Z9889 Other specified postprocedural states: Secondary | ICD-10-CM | POA: Diagnosis not present

## 2017-01-09 DIAGNOSIS — J309 Allergic rhinitis, unspecified: Secondary | ICD-10-CM | POA: Diagnosis not present

## 2017-01-09 DIAGNOSIS — G894 Chronic pain syndrome: Secondary | ICD-10-CM | POA: Diagnosis not present

## 2017-01-09 DIAGNOSIS — G8929 Other chronic pain: Secondary | ICD-10-CM | POA: Diagnosis not present

## 2017-01-09 DIAGNOSIS — E559 Vitamin D deficiency, unspecified: Secondary | ICD-10-CM | POA: Diagnosis not present

## 2017-01-09 DIAGNOSIS — E785 Hyperlipidemia, unspecified: Secondary | ICD-10-CM | POA: Diagnosis not present

## 2017-01-09 DIAGNOSIS — M47816 Spondylosis without myelopathy or radiculopathy, lumbar region: Secondary | ICD-10-CM | POA: Diagnosis not present

## 2017-01-09 DIAGNOSIS — Z8505 Personal history of malignant neoplasm of liver: Secondary | ICD-10-CM | POA: Diagnosis not present

## 2017-01-11 ENCOUNTER — Telehealth: Payer: Self-pay | Admitting: *Deleted

## 2017-01-11 DIAGNOSIS — H40033 Anatomical narrow angle, bilateral: Secondary | ICD-10-CM | POA: Diagnosis not present

## 2017-01-11 DIAGNOSIS — H04123 Dry eye syndrome of bilateral lacrimal glands: Secondary | ICD-10-CM | POA: Diagnosis not present

## 2017-01-11 NOTE — Telephone Encounter (Signed)
Have referring doctor fax order to [place] next-door, there may be some specifics that the doctor would like to emphasize and I would not know what those specifics are.

## 2017-01-12 NOTE — Telephone Encounter (Signed)
Pt aware.

## 2017-01-16 ENCOUNTER — Other Ambulatory Visit: Payer: Self-pay | Admitting: Family Medicine

## 2017-01-29 ENCOUNTER — Ambulatory Visit: Payer: Medicare Other | Attending: Nurse Practitioner | Admitting: Physical Therapy

## 2017-01-29 ENCOUNTER — Encounter: Payer: Self-pay | Admitting: Physical Therapy

## 2017-01-29 ENCOUNTER — Other Ambulatory Visit: Payer: Self-pay | Admitting: Family Medicine

## 2017-01-29 DIAGNOSIS — M545 Low back pain: Secondary | ICD-10-CM | POA: Insufficient documentation

## 2017-01-29 DIAGNOSIS — G8929 Other chronic pain: Secondary | ICD-10-CM | POA: Diagnosis not present

## 2017-01-29 DIAGNOSIS — R293 Abnormal posture: Secondary | ICD-10-CM

## 2017-01-29 NOTE — Therapy (Signed)
[facility] [place], [place], [address] Phone: [phone]   Fax:  [phone]  Physical Therapy Evaluation  Patient Details  Name: Keith Hill MRN: [id number] Date of Birth: July 31, 1932 Referring Provider: Brandy Hale  Encounter Date: 01/29/2017      PT End of Session - 01/29/17 1306    Visit Number 1   Number of Visits 8   Date for PT Re-Evaluation 03/30/17   PT Start Time 1108   PT Stop Time 1205   PT Time Calculation (min) 57 min   Activity Tolerance Patient tolerated treatment well   Behavior During Therapy [facility] for tasks assessed/performed      Past Medical History:  Diagnosis Date  . Allergic rhinitis, cause unspecified   . Cardiac dysrhythmia, unspecified   . Colon polyps   . Essential and other specified forms of tremor   . GERD (gastroesophageal reflux disease)   . Hemorrhoids, external   . Hiatal hernia   . Hyperlipidemia   . Hyperplasia of prostate   . Hypertension   . Lumbar disc disease   . Pancreatitis 1976  . Paralysis agitans ([place]) 11/07/2012  . Parkinson disease ([place])   . Peyronie's disease   . Prostate cancer [facility])    prostate    Past Surgical History:  Procedure Laterality Date  . HEMORRHOID SURGERY    . KNEE SURGERY Left   . PROSTATE BIOPSY  04/03/2013  . PROSTATE BIOPSY  11/01/2011  . SHOULDER SURGERY Left     There were no vitals filed for this visit.       Subjective Assessment - 01/29/17 1340    Subjective The patient presents to c/o low back pain that occurs upon standing.  He reports his pain tends to stay in his low back region.  His pain will rise to an 8/10 with prolonged standing and walking.  Sitting and lying down reduce his pain.              [facility] PT Assessment - 01/29/17 0001      Assessment   Medical Diagnosis Lumbar spondylosis.   Referring Provider Joy Strickland-Bediako   Onset Date/Surgical Date --  Ongoing.     Precautions   Precautions None     Restrictions   Weight Bearing Restrictions No     Balance Screen   Has the patient fallen in the past 6 months No   Has the patient had a decrease in activity level because of a fear of falling?  No   Is the patient reluctant to leave their home because of a fear of falling?  No     Home Environment   Living Environment Private residence     Prior Function   Level of Independence Independent     Observation/Other Assessments   Focus on Therapeutic Outcomes (FOTO)  45% limitation.     Posture/Postural Control   Posture/Postural Control Postural limitations   Postural Limitations Rounded Shoulders;Forward head;Decreased lumbar lordosis;Flexed trunk;Weight shift left     ROM / Strength   AROM / PROM / Strength AROM;Strength     AROM   Overall AROM Comments Invertebral movement into active flexion is limited by 75% and lumbar extension is limited to 3 degrees.     Strength   Overall Strength Comments Normal bilateral LE strength.     Palpation   Palpation comment Taut lumbar erector spinae musculature right > left.     Special Tests    Special Tests --  (-)  SLR testing;(=)leg lengths.  Absent LE DTR's.     Ambulation/Gait   Gait Comments The patient walks with his spine in flexion.            Objective measurements completed on examination: See above findings.          [facility] Adult PT Treatment/Exercise - 01/29/17 0001      Modalities   Modalities Electrical Stimulation;Moist Heat;Traction     Moist Heat Therapy   Number Minutes Moist Heat 15 Minutes   Moist Heat Location Lumbar Spine     Electrical Stimulation   Electrical Stimulation Location Lumbar.   Electrical Stimulation Action IFC   Electrical Stimulation Parameters 80-150 Hz constant x 15 minutes.   Electrical Stimulation Goals Tone;Pain     Traction   Type of Traction Lumbar   Min (lbs) 5   Max (lbs) 70   Hold Time 99   Rest Time 5   Time 15                   PT Short Term Goals - 01/29/17 1332      PT SHORT TERM GOAL #1   Title Independent with a HEP.   Time 4   Period Weeks   Status New     PT SHORT TERM GOAL #2   Title Active lumbar extension to 10 degress.   Time 4   Period Weeks   Status New           PT Long Term Goals - 01/29/17 1333      PT LONG TERM GOAL #1   Title Active lumbar extension= 15 degrees.     PT LONG TERM GOAL #2   Title Walk a community distance with with pain not > 3/10.   Time 8   Period Weeks   Status New                Plan - 01/29/17 1324    Clinical Impression Statement The patient presents to OPPT with c/o low back pain that increases with standing.  He has a notable loss of lumbar active movement.  The patient is active with regards to exercising as long as he is seated (ie:  stationary bike).  His deficits prevent him from walking long distances as he would like to.   History and Personal Factors relevant to plan of care: H/o low back pain and lumbar DDD.   Clinical Presentation Evolving   Clinical Decision Making Low   Rehab Potential Good   PT Frequency 1x / week   PT Duration 8 weeks   PT Treatment/Interventions ADLs/Self Care Home Management;Electrical Stimulation;Functional mobility training;Therapeutic activities;Therapeutic exercise;Dry needling;Traction;Ultrasound;Moist Heat;Cryotherapy;Patient/family education   PT Next Visit Plan STW/M to lumbar erector spinae musculature; PA mobs; combo e'stim/U/S.  Work on extension of lumbar spine and core exercises.  Intermittemnt traction next visit at 80#.  He can max at 105 to 110#.   Consulted and Agree with Plan of Care Patient      Patient will benefit from skilled therapeutic intervention in order to improve the following deficits and impairments:  Decreased activity tolerance, Decreased range of motion, Postural dysfunction, Pain  Visit Diagnosis: Chronic midline low back pain without sciatica - Plan: PT plan of care  cert/re-cert  Abnormal posture - Plan: PT plan of care cert/re-cert      G-Codes - 40/98/11 1335    Functional Assessment Tool Used (Outpatient Only) FOTO...45% limitation.   Functional Limitation Mobility: Walking and moving  around   Mobility: Walking and Moving Around Current Status [phone]) At least 40 percent but less than 60 percent impaired, limited or restricted   Mobility: Walking and Moving Around Goal Status [id number]) At least 20 percent but less than 40 percent impaired, limited or restricted       Problem List Patient Active Problem List   Diagnosis Date Noted  . Back pain 11/18/2013  . Vitamin D deficiency 08/28/2013  . Hiatal hernia with gastroesophageal reflux 08/28/2013  . CAD (coronary artery disease) 04/08/2013  . Essential and other specified forms of tremor 03/31/2013  . Parkinson's 11/07/2012  . Hemorrhoids 02/17/2011  . Hyperlipidemia with target LDL less than 70   . Prostate cancer ([place])   . Hypertension   . Allergic rhinitis   . Colon polyps, history of     Pharrell Ledford, [name] 01/29/2017, 1:40 PM  [facility] [address] [place], [place], [address] Phone: [phone]   Fax:  [phone]  Name: Keith Hill MRN: [id number] Date of Birth: Apr 06, 1932

## 2017-01-31 ENCOUNTER — Ambulatory Visit: Payer: Medicare Other | Admitting: Physical Therapy

## 2017-01-31 ENCOUNTER — Encounter: Payer: Self-pay | Admitting: Physical Therapy

## 2017-01-31 DIAGNOSIS — R293 Abnormal posture: Secondary | ICD-10-CM

## 2017-01-31 DIAGNOSIS — G8929 Other chronic pain: Secondary | ICD-10-CM

## 2017-01-31 DIAGNOSIS — M545 Low back pain, unspecified: Secondary | ICD-10-CM

## 2017-01-31 NOTE — Therapy (Signed)
[facility] [place], [place], [address] Phone: [phone]   Fax:  [phone]  Physical Therapy Treatment  Patient Details  Name: Keith Hill MRN: [id number] Date of Birth: 05-02-1932 Referring Provider: Brandy Hale  Encounter Date: 01/31/2017      PT End of Session - 01/31/17 1123    Visit Number 2   Number of Visits 8   Date for PT Re-Evaluation 03/30/17   PT Start Time 1114   PT Stop Time 1207   PT Time Calculation (min) 53 min   Activity Tolerance Patient tolerated treatment well   Behavior During Therapy [facility] for tasks assessed/performed      Past Medical History:  Diagnosis Date  . Allergic rhinitis, cause unspecified   . Cardiac dysrhythmia, unspecified   . Colon polyps   . Essential and other specified forms of tremor   . GERD (gastroesophageal reflux disease)   . Hemorrhoids, external   . Hiatal hernia   . Hyperlipidemia   . Hyperplasia of prostate   . Hypertension   . Lumbar disc disease   . Pancreatitis 1976  . Paralysis agitans ([place]) 11/07/2012  . Parkinson disease ([place])   . Peyronie's disease   . Prostate cancer [facility])    prostate    Past Surgical History:  Procedure Laterality Date  . HEMORRHOID SURGERY    . KNEE SURGERY Left   . PROSTATE BIOPSY  04/03/2013  . PROSTATE BIOPSY  11/01/2011  . SHOULDER SURGERY Left     There were no vitals filed for this visit.      Subjective Assessment - 01/31/17 1125    Subjective That treatment really helped a lot.   Pain Score 5    Pain Location Back   Pain Orientation Right;Left;Mid   Pain Descriptors / Indicators Aching;Throbbing   Pain Type Chronic pain   Pain Frequency Several days a week                         [facility] Adult PT Treatment/Exercise - 01/31/17 0001      Modalities   Modalities Traction     Moist Heat Therapy   Number Minutes Moist Heat 18 Minutes   Moist Heat Location --  Lumbar spine.     Copy Action IFC   Electrical Stimulation Parameters 80-150 Hz x 18 minutes.   Electrical Stimulation Goals Tone;Pain     Traction   Type of Traction Lumbar   Min (lbs) 5   Max (lbs) 80   Hold Time 99   Rest Time 5   Time 15     Manual Therapy   Manual Therapy Soft tissue mobilization   Soft tissue mobilization In left SDLY position:  Performed  STW/M and right Q:L release x 11 minutes.                  PT Short Term Goals - 01/29/17 1332      PT SHORT TERM GOAL #1   Title Independent with a HEP.   Time 4   Period Weeks   Status New     PT SHORT TERM GOAL #2   Title Active lumbar extension to 10 degress.   Time 4   Period Weeks   Status New           PT Long Term Goals - 01/29/17 1333      PT  LONG TERM GOAL #1   Title Active lumbar extension= 15 degrees.     PT LONG TERM GOAL #2   Title Walk a community distance with with pain not > 3/10.   Time 8   Period Weeks   Status New               Plan - 01/31/17 1259    Clinical Impression Statement Excellent response to treatment today.      Patient will benefit from skilled therapeutic intervention in order to improve the following deficits and impairments:  Decreased activity tolerance, Decreased range of motion, Postural dysfunction, Pain  Visit Diagnosis: Chronic midline low back pain without sciatica  Abnormal posture     Problem List Patient Active Problem List   Diagnosis Date Noted  . Back pain 11/18/2013  . Vitamin D deficiency 08/28/2013  . Hiatal hernia with gastroesophageal reflux 08/28/2013  . CAD (coronary artery disease) 04/08/2013  . Essential and other specified forms of tremor 03/31/2013  . Parkinson's 11/07/2012  . Hemorrhoids 02/17/2011  . Hyperlipidemia with target LDL less than 70   . Prostate cancer ([place])   . Hypertension   . Allergic rhinitis   . Colon polyps, history of      Jostin Rue, [name] 01/31/2017, 1:01 PM  [facility] [address] [place], [place], [address] Phone: [phone]   Fax:  [phone]  Name: Keith Hill MRN: [id number] Date of Birth: March 19, 1932

## 2017-02-02 ENCOUNTER — Encounter: Payer: Medicare Other | Admitting: Physical Therapy

## 2017-02-05 ENCOUNTER — Ambulatory Visit: Payer: Medicare Other | Admitting: Physical Therapy

## 2017-02-05 DIAGNOSIS — R293 Abnormal posture: Secondary | ICD-10-CM

## 2017-02-05 DIAGNOSIS — M545 Low back pain: Secondary | ICD-10-CM | POA: Diagnosis not present

## 2017-02-05 DIAGNOSIS — G8929 Other chronic pain: Secondary | ICD-10-CM | POA: Diagnosis not present

## 2017-02-05 NOTE — Therapy (Signed)
[facility] [place], [place], [address] Phone: [phone]   Fax:  [phone]  Physical Therapy Treatment  Patient Details  Name: Keith Hill MRN: [id number] Date of Birth: [birth year] Referring Provider: Brandy Hale  Encounter Date: 02/05/2017      PT End of Session - 02/05/17 1225    Visit Number 3   Number of Visits 8   Date for PT Re-Evaluation 03/30/17   PT Start Time 1115   PT Stop Time 1202   PT Time Calculation (min) 47 min   Activity Tolerance Patient tolerated treatment well   Behavior During Therapy [facility] for tasks assessed/performed      Past Medical History:  Diagnosis Date  . Allergic rhinitis, cause unspecified   . Cardiac dysrhythmia, unspecified   . Colon polyps   . Essential and other specified forms of tremor   . GERD (gastroesophageal reflux disease)   . Hemorrhoids, external   . Hiatal hernia   . Hyperlipidemia   . Hyperplasia of prostate   . Hypertension   . Lumbar disc disease   . Pancreatitis 1976  . Paralysis agitans ([place]) 11/07/2012  . Parkinson disease ([place])   . Peyronie's disease   . Prostate cancer [facility])    prostate    Past Surgical History:  Procedure Laterality Date  . HEMORRHOID SURGERY    . KNEE SURGERY Left   . PROSTATE BIOPSY  04/03/2013  . PROSTATE BIOPSY  11/01/2011  . SHOULDER SURGERY Left     There were no vitals filed for this visit.      Subjective Assessment - 02/05/17 1206    Subjective Patient pleased with response to treatment thus far reporting less pain overall.   Patient Stated Goals Get rid of my back pain.   Pain Score 4    Pain Location Back   Pain Orientation Right;Left;Mid   Pain Descriptors / Indicators Aching   Pain Type Chronic pain                         [facility] Adult PT Treatment/Exercise - 02/05/17 0001      Self-Care   Self-Care Other Self-Care Comments   Other Self-Care Comments  Discussed dry needling,  received informed consent and discussed post-needling activites.  Furhtermore, discussed sleeping posture and correct upright posture.     Modalities   Modalities Electrical Stimulation     Moist Heat Therapy   Moist Heat Location --  Right lumbar region.     Acupuncturist Location --  Right lumbar.   Electrical Stimulation Action Pre-mod.   Electrical Stimulation Parameters 80-150 Hz x 15 minutes to righjt lumbar region.   Electrical Stimulation Goals Tone;Pain     Manual Therapy   Manual Therapy Other (comment)   Soft tissue mobilization Following Dry Needling performed right QL release technqiue x 6 minutes.   Other Manual Therapy In prone:  Performed dry needling to right lumbar region L3 to L5:  Performed to multifidus and erector spinae (Longissimus)  Excellent twitch response in all treated musculature.          Trigger Point Dry Needling - 02/05/17 1202    Consent Given? Yes   Education Handout Provided Yes   Muscles Treated Upper Body Longissimus              PT Education - 02/05/17 1158    Education provided Yes   Education Details Informed consent  received from patient for dry needling.  Discussed post needling activites and also discussed sleeping posture for increased comfort.     Person(s) Educated Patient   Methods Explanation;Demonstration;Verbal cues;Handout  Dry needlinghandout.          PT Short Term Goals - 01/29/17 1332      PT SHORT TERM GOAL #1   Title Independent with a HEP.   Time 4   Period Weeks   Status New     PT SHORT TERM GOAL #2   Title Active lumbar extension to 10 degress.   Time 4   Period Weeks   Status New           PT Long Term Goals - 01/29/17 1333      PT LONG TERM GOAL #1   Title Active lumbar extension= 15 degrees.     PT LONG TERM GOAL #2   Title Walk a community distance with with pain not > 3/10.   Time 8   Period Weeks   Status New               Plan  - 02/05/17 1219    Clinical Impression Statement Patient responded very well to dry needling and reported less pain following treatment.      Patient will benefit from skilled therapeutic intervention in order to improve the following deficits and impairments:  Decreased activity tolerance, Decreased range of motion, Postural dysfunction, Pain  Visit Diagnosis: Chronic midline low back pain without sciatica  Abnormal posture     Problem List Patient Active Problem List   Diagnosis Date Noted  . Back pain 11/18/2013  . Vitamin D deficiency 08/28/2013  . Hiatal hernia with gastroesophageal reflux 08/28/2013  . CAD (coronary artery disease) 04/08/2013  . Essential and other specified forms of tremor 03/31/2013  . Parkinson's 11/07/2012  . Hemorrhoids 02/17/2011  . Hyperlipidemia with target LDL less than 70   . Prostate cancer ([place])   . Hypertension   . Allergic rhinitis   . Colon polyps, history of     Keith Hill, [name] 02/05/2017, 12:27 PM  [facility] [address] [place], [place], [address] Phone: [phone]   Fax:  [phone]  Name: Keith Hill MRN: [id number] Date of Birth: 1931/09/12

## 2017-02-05 NOTE — Patient Instructions (Addendum)
[facility]  DRY NEEDLING CONSENT FORM   Trigger point dry needling is a physical therapy approach to treat Myofascial Pain and Dysfunction.  Dry Needling (DN) is a valuable and effective way to deactivate myofascial trigger points (muscle knots). It is skilled intervention that uses a thin filiform needle to penetrate the skin and stimulate underlying myofascial trigger points, muscular, and connective tissues for the management of neuromusculoskeletal pain and movement impairments.  A local twitch response (LTR) will be elicited.  This can sometimes feel like a deep ache in the muscle during the procedure. Multiple trigger points in multiple muscles can be treated during each treatment.  No medication of any kind is injected.   As with any medical treatment and procedure, there are possible adverse events.  While significant adverse events are uncommon, they do sometimes occur and must be considered prior to giving consent.  1. Dry needling often causes a "post needling soreness".  There can be an increase in pain from a couple of hours to 2-3 days, followed by an improvement in the overall pain state. 2. Any time a needle is used there is a risk of infection.  However, we are using new, sterile, and disposable needles; infections are extremely rare. 3. There is a possibility that you may bleed or bruise.  You may feel tired and some nausea following treatment. 4. There is a rare possibility of a pneumothorax (air in the chest cavity). 5. Allergic reaction to nickel in the stainless steel needle. 6. If a nerve is touched, it may cause paresthesia (a prickling/shock sensation) which is usually brief, but may continue for a couple of days.  Following treatment stay hydrated.  Continue regular activities but not too vigorous initially after treatment for 24-48 hours.  Dry Needling is best when combined with other physical therapy interventions such as strengthening,  stretching and other therapeutic modalities.   PLEASE ANSWER THE FOLLOWING QUESTIONS:  Do you have a lack of sensation?   Y/N  Do you have a phobia or fear of needles  Y/N  Are you pregnant?    Y/N If yes:  How many weeks? __________ Do you have any implanted devices?  Y/N If yes:  Pacemaker/Spinal Cord Stimulator/Deep Brain Stimulator/Insulin Pump/Other: ________________ Do you have any implants?  Y/N If yes: Breast/Facial/Pecs/Buttocks/Calves/Hip  Replacement/ Knee Replacement/Other: _________ Do you take any blood thinners?   Y/N If yes: Coumadin (Warfarin)/Other: ___________________ Do you have a bleeding disorder?   Y/N If yes: What kind: _________________________________ Do you take any immunosuppressants?  Y/N If yes:   What kind: _________________________________ Do you take anti-inflammatories?   Y/N If yes: What kind: Advil/Aspirin/Other: ________________ Have you ever been diagnosed with Scoliosis? Y/N Have you had back surgery?   Y/N If yes:  Laminectomy/Fusion/Other: ___________________   I have read, or had read to me, the above.  I have had the opportunity to ask any questions.  All of my questions have been answered to my satisfaction and I understand the risks involved with dry needling.  I consent to examination and treatment at [facility], including dry needling, of any and all of my involved and affected muscles.   Reviewed above with patient.  He reports no known contraindications and appears to be an excellent candidate for Dry Needling.

## 2017-02-12 ENCOUNTER — Encounter: Payer: Self-pay | Admitting: Physical Therapy

## 2017-02-12 ENCOUNTER — Ambulatory Visit: Payer: Medicare Other | Admitting: Physical Therapy

## 2017-02-12 DIAGNOSIS — R293 Abnormal posture: Secondary | ICD-10-CM

## 2017-02-12 DIAGNOSIS — G8929 Other chronic pain: Secondary | ICD-10-CM | POA: Diagnosis not present

## 2017-02-12 DIAGNOSIS — M545 Low back pain: Principal | ICD-10-CM

## 2017-02-12 NOTE — Patient Instructions (Signed)
Pelvic Tilt: Posterior - Legs Bent (Supine)   Tighten stomach and flatten back by rolling pelvis down. Hold _10___ seconds. Relax. Repeat _10-30___ times per set. Do __2__ sets per session. Do _2___ sessions per day.   . Straight Leg Raise   Tighten stomach and slowly raise locked right leg __4__ inches from floor. Repeat __10-30__ times per set. Do __2__ sets per session. Do __2__ sessions per day.   Bent Leg Lift (Hook-Lying)   Tighten stomach and slowly raise right leg _5___ inches from floor. Keep trunk rigid. Hold _3___ seconds. Repeat _10___ times per set. Do ___2-3_ sets per session. Do __2__ sessions per day.    

## 2017-02-12 NOTE — Therapy (Signed)
[facility] [place], [place], [address] Phone: [phone]   Fax:  [phone]  Physical Therapy Treatment  Patient Details  Name: Keith Hill MRN: [id number] Date of Birth: November 04, 1931 Referring Provider: Brandy Hale  Encounter Date: 02/12/2017      PT End of Session - 02/12/17 1150    Visit Number 4   Number of Visits 8   Date for PT Re-Evaluation 03/30/17   PT Start Time 1113   PT Stop Time 1203   PT Time Calculation (min) 50 min   Activity Tolerance Patient tolerated treatment well   Behavior During Therapy [facility] for tasks assessed/performed      Past Medical History:  Diagnosis Date  . Allergic rhinitis, cause unspecified   . Cardiac dysrhythmia, unspecified   . Colon polyps   . Essential and other specified forms of tremor   . GERD (gastroesophageal reflux disease)   . Hemorrhoids, external   . Hiatal hernia   . Hyperlipidemia   . Hyperplasia of prostate   . Hypertension   . Lumbar disc disease   . Pancreatitis 1976  . Paralysis agitans ([place]) 11/07/2012  . Parkinson disease ([place])   . Peyronie's disease   . Prostate cancer [facility])    prostate    Past Surgical History:  Procedure Laterality Date  . HEMORRHOID SURGERY    . KNEE SURGERY Left   . PROSTATE BIOPSY  04/03/2013  . PROSTATE BIOPSY  11/01/2011  . SHOULDER SURGERY Left     There were no vitals filed for this visit.      Subjective Assessment - 02/12/17 1124    Subjective Patient reported relief for a few hours after traction and less discomfort   Pertinent History H/o low back pain and lumbar DDD.   Limitations Standing   How long can you stand comfortably? 10 minutes.   Patient Stated Goals Get rid of my back pain.   Currently in Pain? Yes   Pain Score 4    Pain Location Back   Pain Orientation Right;Left;Mid   Pain Descriptors / Indicators Aching   Pain Type Chronic pain   Pain Onset More than a month ago   Pain  Frequency Intermittent   Aggravating Factors  prolong standing or walking   Pain Relieving Factors at rest                         [facility] Adult PT Treatment/Exercise - 02/12/17 0001      Exercises   Exercises Lumbar     Lumbar Exercises: Supine   Ab Set 20 reps;3 seconds   Glut Set 20 reps;3 seconds   Bent Knee Raise 3 seconds  2x10   Straight Leg Raise 10 reps;3 seconds     Moist Heat Therapy   Number Minutes Moist Heat 15 Minutes   Moist Heat Location Lumbar Spine     Electrical Stimulation   Electrical Stimulation Location Lumbar   Electrical Stimulation Action premod   Electrical Stimulation Parameters 80-150hz  x52min   Electrical Stimulation Goals Tone;Pain     Traction   Type of Traction Lumbar   Min (lbs) 5   Max (lbs) 85   Hold Time 99   Rest Time 5   Time 15                PT Education - 02/12/17 1152    Education provided Yes   Education Details draw ins   Person(s)  Educated Patient   Methods Explanation;Demonstration;Handout   Comprehension Verbalized understanding;Returned demonstration          PT Short Term Goals - 02/12/17 1151      PT SHORT TERM GOAL #1   Title Independent with a HEP.   Time 4   Period Weeks   Status On-going     PT SHORT TERM GOAL #2   Title Active lumbar extension to 10 degress.   Time 4   Period Weeks   Status On-going           PT Long Term Goals - 02/12/17 1151      PT LONG TERM GOAL #1   Title Active lumbar extension= 15 degrees.   Time 8   Period Weeks   Status On-going     PT LONG TERM GOAL #2   Title Walk a community distance with with pain not > 3/10.   Time 8   Period Weeks   Status On-going               Plan - 02/12/17 1153    Clinical Impression Statement Patient tolerated treatment well today. Patient understands core activation techniques and exercise progression. HEP given for draw in progression. Patient continues to respond well to lumbar traction.  Goals progressing yet ongoing due to pain and posture limitations.    Rehab Potential Good   PT Frequency 1x / week   PT Duration 8 weeks   PT Treatment/Interventions ADLs/Self Care Home Management;Electrical Stimulation;Functional mobility training;Therapeutic activities;Therapeutic exercise;Dry needling;Traction;Ultrasound;Moist Heat;Cryotherapy;Patient/family education   PT Next Visit Plan STW/M to lumbar erector spinae musculature; PA mobs; combo e'stim/U/S.  Work on extension of lumbar spine and core exercises.  Intermittemnt traction next visit at 90# max at 105 to 110#.   Consulted and Agree with Plan of Care Patient      Patient will benefit from skilled therapeutic intervention in order to improve the following deficits and impairments:  Decreased activity tolerance, Decreased range of motion, Postural dysfunction, Pain  Visit Diagnosis: Chronic midline low back pain without sciatica  Abnormal posture     Problem List Patient Active Problem List   Diagnosis Date Noted  . Back pain 11/18/2013  . Vitamin D deficiency 08/28/2013  . Hiatal hernia with gastroesophageal reflux 08/28/2013  . CAD (coronary artery disease) 04/08/2013  . Essential and other specified forms of tremor 03/31/2013  . Parkinson's 11/07/2012  . Hemorrhoids 02/17/2011  . Hyperlipidemia with target LDL less than 70   . Prostate cancer ([place])   . Hypertension   . Allergic rhinitis   . Colon polyps, history of     Danna Casella P, PTA 02/12/2017, 12:06 PM  [facility] [address] [place], [place], [address] Phone: [phone]   Fax:  [phone]  Name: Keith Hill MRN: [id number] Date of Birth: 08/30/1931

## 2017-02-13 ENCOUNTER — Ambulatory Visit (INDEPENDENT_AMBULATORY_CARE_PROVIDER_SITE_OTHER): Payer: Medicare Other | Admitting: Neurology

## 2017-02-13 ENCOUNTER — Encounter: Payer: Self-pay | Admitting: Neurology

## 2017-02-13 VITALS — BP 140/60 | HR 62 | Wt 164.5 lb

## 2017-02-13 DIAGNOSIS — M545 Low back pain, unspecified: Secondary | ICD-10-CM

## 2017-02-13 DIAGNOSIS — G8929 Other chronic pain: Secondary | ICD-10-CM | POA: Diagnosis not present

## 2017-02-13 DIAGNOSIS — G20 Parkinson's disease: Secondary | ICD-10-CM

## 2017-02-13 DIAGNOSIS — G20A1 Parkinson's disease without dyskinesia, without mention of fluctuations: Secondary | ICD-10-CM

## 2017-02-13 NOTE — Progress Notes (Signed)
Reason for visit: Parkinson's disease  Keith Hill is an 83 y.o. male  History of present illness:  [name] is an 83 year old right-handed white male with a history of Parkinson's disease and chronic low back pain. The patient has continued to have severe back pain that occurs with standing only, and goes away with sitting or lying down. The patient has had a reduction in his physical activities because of the back pain. He is followed by Dr. Nelva Bush, and he has been seen through the spine center at [facility]. He has had multiple injections in the back without benefit, he has had traction of the back. He is limited in how far he can walk because of his back pain, he can walk about 100 yards. He does have some tremors that mainly affect the right arm. His mobility otherwise is good. He denies any problems with swallowing. He denies any falls. He does play golf on occasion.   Past Medical History:  Diagnosis Date  . Allergic rhinitis, cause unspecified   . Cardiac dysrhythmia, unspecified   . Colon polyps   . Essential and other specified forms of tremor   . GERD (gastroesophageal reflux disease)   . Hemorrhoids, external   . Hiatal hernia   . Hyperlipidemia   . Hyperplasia of prostate   . Hypertension   . Lumbar disc disease   . Pancreatitis 1976  . Paralysis agitans ([place]) 11/07/2012  . Parkinson disease ([place])   . Peyronie's disease   . Prostate cancer [facility])    prostate    Past Surgical History:  Procedure Laterality Date  . HEMORRHOID SURGERY    . KNEE SURGERY Left   . PROSTATE BIOPSY  04/03/2013  . PROSTATE BIOPSY  11/01/2011  . SHOULDER SURGERY Left     Family History  Problem Relation Age of Onset  . Kidney failure Father   . Alcoholism Brother   . Heart attack Brother   . Hyperlipidemia Son   . Cancer Neg Hx     Social history:  reports that he has never smoked. He has never used smokeless tobacco. He reports that he does not drink alcohol or use drugs.      Allergies  Allergen Reactions  . Aspirin     Upsets stomach  . Calicum Glycerophosphate   . Niaspan [Niacin Er]   . Zocor [Simvastatin]     Medications:  Prior to Admission medications   Medication Sig Start Date End Date Taking? Authorizing Provider  amLODipine (NORVASC) 10 MG tablet TAKE (1/2) TABLET DAILY. 01/17/17  Yes Chipper Herb, MD  atorvastatin (LIPITOR) 40 MG tablet TAKE ONE TABLET AT BEDTIME 01/31/16  Yes Chipper Herb, MD  carbidopa-levodopa (SINEMET IR) 25-100 MG tablet 1.5 tablets in the morning and 1 tablet at noon and evening 03/28/16  Yes Kathrynn Ducking, MD  Cholecalciferol (VITAMIN D3) 5000 UNITS CAPS Take 1 capsule by mouth 4 (four) times a week. Reported on 09/14/2015   Yes [provider]  docusate sodium (COLACE) 100 MG capsule Take 100 mg by mouth 2 (two) times daily. daily   Yes [provider]  doxylamine, Sleep, (UNISOM) 25 MG tablet Take 12.5 mg by mouth at bedtime as needed.   Yes [provider]  ELIQUIS 5 MG TABS tablet TAKE (1) TABLET TWICE A DAY. 01/29/17  Yes Chipper Herb, MD  fluticasone [facility]) 50 MCG/ACT nasal spray Place into both nostrils daily.   Yes [provider]  Brooke Pace  RX 2.2-25-1 MG TABS TAKE 1 TABLET DAILY 04/03/16  Yes Chipper Herb, MD  guaiFENesin (MUCINEX) 600 MG 12 hr tablet Take 600 mg by mouth 2 (two) times daily as needed.   Yes [provider]  [name] Oil 300 MG CAPS Take 500 mg by mouth daily. Reported on 09/14/2015   Yes [provider]  Magnesium 250 MG TABS Take 2 tablets by mouth at bedtime as needed.    Yes [provider]  metoprolol (LOPRESSOR) 50 MG tablet Take 50 mg by mouth daily.    Yes [provider]  Probiotic Product ([place]) Take by mouth 1 dose over 24 hours.     Yes [provider]  rasagiline (AZILECT) 1 MG TABS tablet Take 1 tablet (1 mg total) by mouth daily. 08/29/16  Yes Kathrynn Ducking, MD  terazosin  (HYTRIN) 2 MG capsule TAKE (1) CAPSULE DAILY AT BEDTIME. 01/01/17  Yes Chipper Herb, MD  valsartan-hydrochlorothiazide (DIOVAN-HCT) 320-25 MG tablet TAKE 1 TABLET ONCE A DAY Patient taking differently: take 2 tabs a week - he cuts them down 01/31/16  Yes Chipper Herb, MD    ROS:  Out of a complete 14 system review of symptoms, the patient complains only of the following symptoms, and all other reviewed systems are negative.  Low back pain  Blood pressure 140/60, pulse 62, weight 164 lb 8 oz (74.6 kg), SpO2 98 %.  Physical Exam  General: The patient is alert and cooperative at the time of the examination.  Skin: No significant peripheral edema is noted.   Neurologic Exam  Mental status: The patient is alert and oriented x 3 at the time of the examination. The patient has apparent normal recent and remote memory, with an apparently normal attention span and concentration ability.   Cranial nerves: Facial symmetry is present. Speech is normal, no aphasia or dysarthria is noted. Slight hypophonia is noted with his speech. Extraocular movements are full. Visual fields are full.  Motor: The patient has good strength in all 4 extremities.  Sensory examination: Soft touch sensation is symmetric on the face, arms, and legs.  Coordination: The patient has good finger-nose-finger and heel-to-shin bilaterally.  Gait and station: The patient has the ability to arise easily from a chair with arms crossed. Once up, he is able to ambulate with good stride and good turns. The patient is stooped with walking. He does have a resting tremor and decreased arm swing on the right. Romberg is negative. No drift is seen.  Reflexes: Deep tendon reflexes are symmetric.   Assessment/Plan:  1. Parkinson's disease  The patient has minimal issues with his mobility as a with Parkinson's disease. The patient's main disability is low back pain. The patient will continue his current medications without  change in dosing with Sinemet and Azilect. He will follow-up in 5 months.  Jill Alexanders MD 02/13/2017 10:01 AM  [facility] [address] [place], [place] [address]  Phone [phone] Fax [phone]

## 2017-02-19 ENCOUNTER — Ambulatory Visit: Payer: Medicare Other | Attending: Nurse Practitioner | Admitting: Physical Therapy

## 2017-02-19 DIAGNOSIS — R293 Abnormal posture: Secondary | ICD-10-CM | POA: Insufficient documentation

## 2017-02-19 DIAGNOSIS — M545 Low back pain: Secondary | ICD-10-CM | POA: Diagnosis not present

## 2017-02-19 DIAGNOSIS — G8929 Other chronic pain: Secondary | ICD-10-CM | POA: Diagnosis not present

## 2017-02-19 NOTE — Therapy (Signed)
[facility] [place], [place], [address] Phone: [phone]   Fax:  [phone]  Physical Therapy Treatment  Patient Details  Name: Keith Hill MRN: [id number] Date of Birth: [birth year] Referring Provider: Brandy Hale  Encounter Date: 02/19/2017      PT End of Session - 02/19/17 1145    Visit Number 5   Number of Visits 8   Date for PT Re-Evaluation 03/30/17   PT Start Time 1030   PT Stop Time 1120   PT Time Calculation (min) 50 min   Activity Tolerance Patient tolerated treatment well   Behavior During Therapy [facility] for tasks assessed/performed      Past Medical History:  Diagnosis Date  . Allergic rhinitis, cause unspecified   . Cardiac dysrhythmia, unspecified   . Colon polyps   . Essential and other specified forms of tremor   . GERD (gastroesophageal reflux disease)   . Hemorrhoids, external   . Hiatal hernia   . Hyperlipidemia   . Hyperplasia of prostate   . Hypertension   . Lumbar disc disease   . Pancreatitis 1976  . Paralysis agitans ([place]) 11/07/2012  . Parkinson disease ([place])   . Peyronie's disease   . Prostate cancer [facility])    prostate    Past Surgical History:  Procedure Laterality Date  . HEMORRHOID SURGERY    . KNEE SURGERY Left   . PROSTATE BIOPSY  04/03/2013  . PROSTATE BIOPSY  11/01/2011  . SHOULDER SURGERY Left     There were no vitals filed for this visit.      Subjective Assessment - 02/19/17 1146    Subjective The patient states that the exercise have helped and he is doing then twice a day.   Pertinent History H/o low back pain and lumbar DDD.   Pain Score 4    Pain Location Back   Pain Orientation Right;Left;Mid   Pain Descriptors / Indicators Aching   Pain Type Chronic pain   Pain Onset More than a month ago                         [facility] Adult PT Treatment/Exercise - 02/19/17 0001      Modalities   Modalities Electrical Stimulation;Moist Heat      Moist Heat Therapy   Number Minutes Moist Heat 15 Minutes   Moist Heat Location --  Lumbar spine.     Acupuncturist Location --  Left lumbar.   Electrical Stimulation Action Pe-mod.   Electrical Stimulation Parameters Constant pre-mod.   Electrical Stimulation Goals Tone;Pain     Traction   Type of Traction Lumbar   Min (lbs) 5   Max (lbs) 85   Hold Time 99   Rest Time 5   Time 15     Manual Therapy   Soft tissue mobilization Bilateral QL release in sdly positions:  8 minutes total.                  PT Short Term Goals - 02/12/17 1151      PT SHORT TERM GOAL #1   Title Independent with a HEP.   Time 4   Period Weeks   Status On-going     PT SHORT TERM GOAL #2   Title Active lumbar extension to 10 degress.   Time 4   Period Weeks   Status On-going  PT Long Term Goals - 02/12/17 1151      PT LONG TERM GOAL #1   Title Active lumbar extension= 15 degrees.   Time 8   Period Weeks   Status On-going     PT LONG TERM GOAL #2   Title Walk a community distance with with pain not > 3/10.   Time 8   Period Weeks   Status On-going               Plan - 02/19/17 1145    Clinical Impression Statement Excellent response to treatment today.  He enjoyed increased traction today.  His right QL was far less tender but his left QL was very tender.   PT Next Visit Plan intermittment lumbar traction at 95# (max 100# to 105#).  Release left QL with manual STW/M.   Consulted and Agree with Plan of Care Patient      Patient will benefit from skilled therapeutic intervention in order to improve the following deficits and impairments:     Visit Diagnosis: Chronic midline low back pain without sciatica  Abnormal posture     Problem List Patient Active Problem List   Diagnosis Date Noted  . Back pain 11/18/2013  . Vitamin D deficiency 08/28/2013  . Hiatal hernia with gastroesophageal reflux 08/28/2013   . CAD (coronary artery disease) 04/08/2013  . Essential and other specified forms of tremor 03/31/2013  . Parkinson's 11/07/2012  . Hemorrhoids 02/17/2011  . Hyperlipidemia with target LDL less than 70   . Prostate cancer ([place])   . Hypertension   . Allergic rhinitis   . Colon polyps, history of     Keith Hill, [name] 02/19/2017, 11:51 AM  [facility] [address] [place], [place], [address] Phone: [phone]   Fax:  [phone]  Name: Keith Hill MRN: [id number] Date of Birth: 1932-03-04

## 2017-02-20 DIAGNOSIS — E785 Hyperlipidemia, unspecified: Secondary | ICD-10-CM | POA: Diagnosis not present

## 2017-02-20 DIAGNOSIS — I2510 Atherosclerotic heart disease of native coronary artery without angina pectoris: Secondary | ICD-10-CM | POA: Diagnosis not present

## 2017-02-20 DIAGNOSIS — I10 Essential (primary) hypertension: Secondary | ICD-10-CM | POA: Diagnosis not present

## 2017-02-20 DIAGNOSIS — G20 Parkinson's disease: Secondary | ICD-10-CM | POA: Diagnosis not present

## 2017-02-20 DIAGNOSIS — Z7901 Long term (current) use of anticoagulants: Secondary | ICD-10-CM | POA: Diagnosis not present

## 2017-02-20 DIAGNOSIS — R0602 Shortness of breath: Secondary | ICD-10-CM | POA: Diagnosis not present

## 2017-02-20 DIAGNOSIS — I481 Persistent atrial fibrillation: Secondary | ICD-10-CM | POA: Diagnosis not present

## 2017-02-27 ENCOUNTER — Ambulatory Visit: Payer: Medicare Other | Admitting: Physical Therapy

## 2017-02-27 DIAGNOSIS — R293 Abnormal posture: Secondary | ICD-10-CM

## 2017-02-27 DIAGNOSIS — M545 Low back pain: Secondary | ICD-10-CM | POA: Diagnosis not present

## 2017-02-27 DIAGNOSIS — G8929 Other chronic pain: Secondary | ICD-10-CM

## 2017-02-27 NOTE — Therapy (Signed)
[facility] [place], [place], [address] Phone: [phone]   Fax:  [phone]  Physical Therapy Treatment  Patient Details  Name: Keith Hill MRN: [id number] Date of Birth: 06-23-1932 Referring Provider: Brandy Hale  Encounter Date: 02/27/2017      PT End of Session - 02/27/17 1144    Visit Number 6   Number of Visits 8   Date for PT Re-Evaluation 03/30/17   PT Start Time 1030   PT Stop Time 1121   PT Time Calculation (min) 51 min   Activity Tolerance Patient tolerated treatment well   Behavior During Therapy [facility] for tasks assessed/performed      Past Medical History:  Diagnosis Date  . Allergic rhinitis, cause unspecified   . Cardiac dysrhythmia, unspecified   . Colon polyps   . Essential and other specified forms of tremor   . GERD (gastroesophageal reflux disease)   . Hemorrhoids, external   . Hiatal hernia   . Hyperlipidemia   . Hyperplasia of prostate   . Hypertension   . Lumbar disc disease   . Pancreatitis 1976  . Paralysis agitans ([place]) 11/07/2012  . Parkinson disease ([place])   . Peyronie's disease   . Prostate cancer [facility])    prostate    Past Surgical History:  Procedure Laterality Date  . HEMORRHOID SURGERY    . KNEE SURGERY Left   . PROSTATE BIOPSY  04/03/2013  . PROSTATE BIOPSY  11/01/2011  . SHOULDER SURGERY Left     There were no vitals filed for this visit.      Subjective Assessment - 02/27/17 1044    Subjective I feel like I need more pull on traction.   Pain Score 4    Pain Location Back   Pain Orientation Right;Left   Pain Descriptors / Indicators Aching   Pain Onset More than a month ago                         [facility] Adult PT Treatment/Exercise - 02/27/17 0001      Modalities   Modalities Electrical Stimulation;Moist Heat;Ultrasound     Moist Heat Therapy   Number Minutes Moist Heat 20 Minutes   Moist Heat Location Lumbar Spine     Electrical Stimulation   Electrical Stimulation Location --  Bilateral low back region.   Electrical Stimulation Action Pre-mod   Electrical Stimulation Parameters 80-150 Hz x 20 minutes.   Electrical Stimulation Goals Tone;Pain     Ultrasound   Ultrasound Location --  Bilateral low back musculature.   Ultrasound Parameters 1.50 W/CM2 x 8 minutes.   Ultrasound Goals Pain     Traction   Type of Traction Lumbar   Min (lbs) 5   Max (lbs) 90   Hold Time 99   Rest Time 5   Time 15                  PT Short Term Goals - 02/12/17 1151      PT SHORT TERM GOAL #1   Title Independent with a HEP.   Time 4   Period Weeks   Status On-going     PT SHORT TERM GOAL #2   Title Active lumbar extension to 10 degress.   Time 4   Period Weeks   Status On-going           PT Long Term Goals - 02/12/17 1151      PT LONG  TERM GOAL #1   Title Active lumbar extension= 15 degrees.   Time 8   Period Weeks   Status On-going     PT LONG TERM GOAL #2   Title Walk a community distance with with pain not > 3/10.   Time 8   Period Weeks   Status On-going               Plan - 02/27/17 1156    Clinical Impression Statement Great response to treatment.   PT Next Visit Plan Lumbar traction at 95#.   Consulted and Agree with Plan of Care Patient      Patient will benefit from skilled therapeutic intervention in order to improve the following deficits and impairments:  Decreased activity tolerance, Decreased range of motion, Postural dysfunction, Pain  Visit Diagnosis: Chronic midline low back pain without sciatica  Abnormal posture     Problem List Patient Active Problem List   Diagnosis Date Noted  . Back pain 11/18/2013  . Vitamin D deficiency 08/28/2013  . Hiatal hernia with gastroesophageal reflux 08/28/2013  . CAD (coronary artery disease) 04/08/2013  . Essential and other specified forms of tremor 03/31/2013  . Parkinson's 11/07/2012  . Hemorrhoids  02/17/2011  . Hyperlipidemia with target LDL less than 70   . Prostate cancer ([place])   . Hypertension   . Allergic rhinitis   . Colon polyps, history of     Leighann Amadon, [name] 02/27/2017, 11:57 AM  [facility] [address] [place], [place], [address] Phone: [phone]   Fax:  [phone]  Name: Keith Hill MRN: [id number] Date of Birth: [birth year]

## 2017-03-06 ENCOUNTER — Ambulatory Visit: Payer: Medicare Other | Admitting: Physical Therapy

## 2017-03-06 DIAGNOSIS — G8929 Other chronic pain: Secondary | ICD-10-CM

## 2017-03-06 DIAGNOSIS — M545 Low back pain, unspecified: Secondary | ICD-10-CM

## 2017-03-06 DIAGNOSIS — R293 Abnormal posture: Secondary | ICD-10-CM | POA: Diagnosis not present

## 2017-03-06 NOTE — Therapy (Signed)
[facility] [place], [place], [address] Phone: [phone]   Fax:  [phone]  Physical Therapy Treatment  Patient Details  Name: Keith Hill MRN: [id number] Date of Birth: [birth year] Referring Provider: Brandy Hale  Encounter Date: 03/06/2017      PT End of Session - 03/06/17 1145    Visit Number 7   Number of Visits 8   Date for PT Re-Evaluation 03/30/17   PT Start Time 1115   PT Stop Time 1212   PT Time Calculation (min) 57 min   Activity Tolerance Patient tolerated treatment well   Behavior During Therapy [facility] for tasks assessed/performed      Past Medical History:  Diagnosis Date  . Allergic rhinitis, cause unspecified   . Cardiac dysrhythmia, unspecified   . Colon polyps   . Essential and other specified forms of tremor   . GERD (gastroesophageal reflux disease)   . Hemorrhoids, external   . Hiatal hernia   . Hyperlipidemia   . Hyperplasia of prostate   . Hypertension   . Lumbar disc disease   . Pancreatitis 1976  . Paralysis agitans ([place]) 11/07/2012  . Parkinson disease ([place])   . Peyronie's disease   . Prostate cancer [facility])    prostate    Past Surgical History:  Procedure Laterality Date  . HEMORRHOID SURGERY    . KNEE SURGERY Left   . PROSTATE BIOPSY  04/03/2013  . PROSTATE BIOPSY  11/01/2011  . SHOULDER SURGERY Left     There were no vitals filed for this visit.      Subjective Assessment - 03/06/17 1145    Subjective I feel like I need more pull on traction.   Pain Score 4    Pain Location Back   Pain Orientation Right;Left   Pain Descriptors / Indicators Aching   Pain Onset More than a month ago     Treatment:  Lumbar mobs and STW/M x 8 minutes f/b Int traction at 100# x 15 minutes f/b HMP and constant Pre-mod to affected lumbar region.  Patient states that this is the best he has felt after a treatment.                              PT Short  Term Goals - 02/12/17 1151      PT SHORT TERM GOAL #1   Title Independent with a HEP.   Time 4   Period Weeks   Status On-going     PT SHORT TERM GOAL #2   Title Active lumbar extension to 10 degress.   Time 4   Period Weeks   Status On-going           PT Long Term Goals - 02/12/17 1151      PT LONG TERM GOAL #1   Title Active lumbar extension= 15 degrees.   Time 8   Period Weeks   Status On-going     PT LONG TERM GOAL #2   Title Walk a community distance with with pain not > 3/10.   Time 8   Period Weeks   Status On-going               Plan - 03/06/17 1157    PT Next Visit Plan Lumbar traction at 105#.      Patient will benefit from skilled therapeutic intervention in order to improve the following deficits and impairments:  Visit Diagnosis: Chronic midline low back pain without sciatica  Abnormal posture     Problem List Patient Active Problem List   Diagnosis Date Noted  . Back pain 11/18/2013  . Vitamin D deficiency 08/28/2013  . Hiatal hernia with gastroesophageal reflux 08/28/2013  . CAD (coronary artery disease) 04/08/2013  . Essential and other specified forms of tremor 03/31/2013  . Parkinson's 11/07/2012  . Hemorrhoids 02/17/2011  . Hyperlipidemia with target LDL less than 70   . Prostate cancer ([place])   . Hypertension   . Allergic rhinitis   . Colon polyps, history of     Alijah Hyde, [name] 03/06/2017, 12:21 PM  [facility] [address] [place], [place], [address] Phone: [phone]   Fax:  [phone]  Name: Keith Hill MRN: [id number] Date of Birth: [birth year]

## 2017-03-13 ENCOUNTER — Ambulatory Visit: Payer: Medicare Other | Admitting: *Deleted

## 2017-03-13 DIAGNOSIS — G8929 Other chronic pain: Secondary | ICD-10-CM

## 2017-03-13 DIAGNOSIS — M545 Low back pain: Secondary | ICD-10-CM | POA: Diagnosis not present

## 2017-03-13 DIAGNOSIS — R293 Abnormal posture: Secondary | ICD-10-CM | POA: Diagnosis not present

## 2017-03-13 NOTE — Therapy (Signed)
[name], [place], [address] Phone: [phone]   Fax:  [phone]  Physical Therapy Treatment  Patient Details  Name: Keith Hill MRN: [id number] Date of Birth: 01/12/1932 Referring Provider: Brandy Hale  Encounter Date: 03/13/2017      PT End of Session - 03/13/17 1250    Visit Number 8   Number of Visits 8   Date for PT Re-Evaluation 03/30/17   PT Start Time 1300  traction only as per pt   PT Stop Time 1325   PT Time Calculation (min) 25 min      Past Medical History:  Diagnosis Date  . Allergic rhinitis, cause unspecified   . Cardiac dysrhythmia, unspecified   . Colon polyps   . Essential and other specified forms of tremor   . GERD (gastroesophageal reflux disease)   . Hemorrhoids, external   . Hiatal hernia   . Hyperlipidemia   . Hyperplasia of prostate   . Hypertension   . Lumbar disc disease   . Pancreatitis 1976  . Paralysis agitans ([place]) 11/07/2012  . Parkinson disease ([place])   . Peyronie's disease   . Prostate cancer [facility])    prostate    Past Surgical History:  Procedure Laterality Date  . HEMORRHOID SURGERY    . KNEE SURGERY Left   . PROSTATE BIOPSY  04/03/2013  . PROSTATE BIOPSY  11/01/2011  . SHOULDER SURGERY Left     There were no vitals filed for this visit.      Subjective Assessment - 03/13/17 1303    Subjective Did good after last Rx and would would like to do more   Pertinent History H/o low back pain and lumbar DDD.   Limitations Standing   How long can you stand comfortably? 10 minutes.   Patient Stated Goals Get rid of my back pain.   Currently in Pain? Yes   Pain Score 4    Pain Location Back   Pain Orientation Right;Left   Pain Type Chronic pain   Pain Onset More than a month ago                                   PT Short Term Goals - 02/12/17 1151      PT SHORT TERM GOAL #1   Title Independent with a HEP.   Time 4   Period Weeks   Status On-going     PT SHORT TERM GOAL #2   Title Active lumbar extension to 10 degress.   Time 4   Period Weeks   Status On-going           PT Long Term Goals - 02/12/17 1151      PT LONG TERM GOAL #1   Title Active lumbar extension= 15 degrees.   Time 8   Period Weeks   Status On-going     PT LONG TERM GOAL #2   Title Walk a community distance with with pain not > 3/10.   Time 8   Period Weeks   Status On-going               Plan - 03/13/17 1305    Clinical Impression Statement Pt arrived to Rx feeling a little better after last Rx and thught the increased pull in traction helped. Pt wanted only traction today.  Traction was performed today at 105#s and tolerated well   Rehab  Potential Good   PT Frequency 1x / week   PT Duration 8 weeks   PT Treatment/Interventions ADLs/Self Care Home Management;Electrical Stimulation;Functional mobility training;Therapeutic activities;Therapeutic exercise;Dry needling;Traction;Ultrasound;Moist Heat;Cryotherapy;Patient/family education   PT Next Visit Plan Lumbar traction at 105#.   Consulted and Agree with Plan of Care Patient      Patient will benefit from skilled therapeutic intervention in order to improve the following deficits and impairments:  Decreased activity tolerance, Decreased range of motion, Postural dysfunction, Pain  Visit Diagnosis: Chronic midline low back pain without sciatica  Abnormal posture     Problem List Patient Active Problem List   Diagnosis Date Noted  . Back pain 11/18/2013  . Vitamin D deficiency 08/28/2013  . Hiatal hernia with gastroesophageal reflux 08/28/2013  . CAD (coronary artery disease) 04/08/2013  . Essential and other specified forms of tremor 03/31/2013  . Parkinson's 11/07/2012  . Hemorrhoids 02/17/2011  . Hyperlipidemia with target LDL less than 70   . Prostate cancer ([place])   . Hypertension   . Allergic rhinitis   . Colon polyps, history of      Keith Hill,Keith Hill, PTA 03/13/2017, 1:42 PM  [facility] [address] [place], [place], [address] Phone: [phone]   Fax:  [phone]  Name: Keith Hill MRN: [id number] Date of Birth: 02/15/1932

## 2017-03-19 ENCOUNTER — Encounter: Payer: Self-pay | Admitting: Physical Therapy

## 2017-03-19 ENCOUNTER — Ambulatory Visit: Payer: Medicare Other | Admitting: Physical Therapy

## 2017-03-19 DIAGNOSIS — R293 Abnormal posture: Secondary | ICD-10-CM | POA: Diagnosis not present

## 2017-03-19 DIAGNOSIS — M545 Low back pain: Secondary | ICD-10-CM | POA: Diagnosis not present

## 2017-03-19 DIAGNOSIS — G8929 Other chronic pain: Secondary | ICD-10-CM | POA: Diagnosis not present

## 2017-03-19 NOTE — Therapy (Signed)
[facility] [place], [place], [address] Phone: [phone]   Fax:  [phone]  Physical Therapy Treatment  Patient Details  Name: Keith Hill MRN: [id number] Date of Birth: 04/25/1932 Referring Provider: Brandy Hale  Encounter Date: 03/19/2017      PT End of Session - 03/19/17 1138    Visit Number 9   Number of Visits 8   Date for PT Re-Evaluation 03/30/17   PT Start Time 1128  Traction only per patient request   PT Stop Time 1150   PT Time Calculation (min) 22 min   Activity Tolerance Patient tolerated treatment well   Behavior During Therapy [facility] for tasks assessed/performed      Past Medical History:  Diagnosis Date  . Allergic rhinitis, cause unspecified   . Cardiac dysrhythmia, unspecified   . Colon polyps   . Essential and other specified forms of tremor   . GERD (gastroesophageal reflux disease)   . Hemorrhoids, external   . Hiatal hernia   . Hyperlipidemia   . Hyperplasia of prostate   . Hypertension   . Lumbar disc disease   . Pancreatitis 1976  . Paralysis agitans ([place]) 11/07/2012  . Parkinson disease ([place])   . Peyronie's disease   . Prostate cancer [facility])    prostate    Past Surgical History:  Procedure Laterality Date  . HEMORRHOID SURGERY    . KNEE SURGERY Left   . PROSTATE BIOPSY  04/03/2013  . PROSTATE BIOPSY  11/01/2011  . SHOULDER SURGERY Left     There were no vitals filed for this visit.      Subjective Assessment - 03/19/17 1137    Subjective Reports that he hasn't noticed a difference with traction or with DN. Reports that he did not feel 105# traction last time. Reports that if he walks around [employer] that if he doesn't have a cart he has pain within 50 yards but if he has cart then he doesn't hurt.   Pertinent History H/o low back pain and lumbar DDD.   Limitations Standing   How long can you stand comfortably? 10 minutes.   Patient Stated Goals Get rid of my back  pain.   Currently in Pain? Yes   Pain Score 4    Pain Location Back   Pain Orientation Right;Left;Lower   Pain Descriptors / Indicators Discomfort   Pain Type Chronic pain   Pain Onset More than a month ago                         [facility] Adult PT Treatment/Exercise - 03/19/17 0001      Modalities   Modalities Traction     Traction   Type of Traction Lumbar   Min (lbs) 5   Max (lbs) 110   Hold Time 99   Rest Time 5   Time 15                  PT Short Term Goals - 03/19/17 1139      PT SHORT TERM GOAL #1   Title Independent with a HEP.   Time 4   Period Weeks   Status Achieved     PT SHORT TERM GOAL #2   Title Active lumbar extension to 10 degress.   Time 4   Period Weeks   Status On-going           PT Long Term Goals - 02/12/17 1151  PT LONG TERM GOAL #1   Title Active lumbar extension= 15 degrees.   Time 8   Period Weeks   Status On-going     PT LONG TERM GOAL #2   Title Walk a community distance with with pain not > 3/10.   Time 8   Period Weeks   Status On-going               Plan - 03/19/17 1141    Clinical Impression Statement Patient presented in clinic with requests for lumbar traction only but unable to see improvement following traction or DN. Traction max increased to 110# per Mali Applegate, MPT discretion. Patient experienced a pull with 110# traction per patient report. Still limited with goals secondary to lack of goal lumbar ROM as well as pain with community distance.   Rehab Potential Good   PT Frequency 1x / week   PT Duration 8 weeks   PT Treatment/Interventions ADLs/Self Care Home Management;Electrical Stimulation;Functional mobility training;Therapeutic activities;Therapeutic exercise;Dry needling;Traction;Ultrasound;Moist Heat;Cryotherapy;Patient/family education   PT Next Visit Plan Continue to assess lumbar traction response with max per MPT at 110#.   Consulted and Agree with Plan of Care  Patient      Patient will benefit from skilled therapeutic intervention in order to improve the following deficits and impairments:  Decreased activity tolerance, Decreased range of motion, Postural dysfunction, Pain  Visit Diagnosis: Chronic midline low back pain without sciatica  Abnormal posture     Problem List Patient Active Problem List   Diagnosis Date Noted  . Back pain 11/18/2013  . Vitamin D deficiency 08/28/2013  . Hiatal hernia with gastroesophageal reflux 08/28/2013  . CAD (coronary artery disease) 04/08/2013  . Essential and other specified forms of tremor 03/31/2013  . Parkinson's 11/07/2012  . Hemorrhoids 02/17/2011  . Hyperlipidemia with target LDL less than 70   . Prostate cancer ([place])   . Hypertension   . Allergic rhinitis   . Colon polyps, history of     Ahmed Prima, [place] 03/19/17 11:55 AM  [facility] [address] [place], [place], [address] Phone: [phone]   Fax:  [phone]  Name: Keith Hill MRN: [id number] Date of Birth: 1932/08/19

## 2017-03-27 ENCOUNTER — Ambulatory Visit: Payer: Medicare Other | Attending: Nurse Practitioner

## 2017-03-27 DIAGNOSIS — M545 Low back pain: Secondary | ICD-10-CM | POA: Insufficient documentation

## 2017-03-27 DIAGNOSIS — R293 Abnormal posture: Secondary | ICD-10-CM | POA: Diagnosis not present

## 2017-03-27 DIAGNOSIS — G8929 Other chronic pain: Secondary | ICD-10-CM | POA: Diagnosis not present

## 2017-03-27 NOTE — Therapy (Addendum)
[facility] [place], [place], [address] Phone: [phone]   Fax:  [phone]  Physical Therapy Treatment  Patient Details  Name: Keith Hill MRN: [id number] Date of Birth: 09-Apr-1932 Referring Provider: Brandy Hale  Encounter Date: 03/27/2017      PT End of Session - 03/27/17 1129    Visit Number 10   Number of Visits 16   Date for PT Re-Evaluation 04/27/17   PT Start Time 1115   PT Stop Time 1200   PT Time Calculation (min) 45 min   Activity Tolerance Patient tolerated treatment well   Behavior During Therapy [facility] for tasks assessed/performed      Past Medical History:  Diagnosis Date  . Allergic rhinitis, cause unspecified   . Cardiac dysrhythmia, unspecified   . Colon polyps   . Essential and other specified forms of tremor   . GERD (gastroesophageal reflux disease)   . Hemorrhoids, external   . Hiatal hernia   . Hyperlipidemia   . Hyperplasia of prostate   . Hypertension   . Lumbar disc disease   . Pancreatitis 1976  . Paralysis agitans ([place]) 11/07/2012  . Parkinson disease ([place])   . Peyronie's disease   . Prostate cancer [facility])    prostate    Past Surgical History:  Procedure Laterality Date  . HEMORRHOID SURGERY    . KNEE SURGERY Left   . PROSTATE BIOPSY  04/03/2013  . PROSTATE BIOPSY  11/01/2011  . SHOULDER SURGERY Left     There were no vitals filed for this visit.      Subjective Assessment - 03/27/17 1138    Subjective Pt. noting he wishes to be d/c from therapy as he feels he is no longer benefiting from therapy.    Patient Stated Goals Get rid of my back pain.   Currently in Pain? No/denies   Pain Score 0-No pain  5/10 pain while standing and walking    Multiple Pain Sites No            [facility] PT Assessment - 03/27/17 1156      Observation/Other Assessments   Focus on Therapeutic Outcomes (FOTO)  44% (56% limitation)     AROM   Overall AROM Comments Invertebral  movement into active flexion is limited by 50% and lumbar extension is limited to 12 degrees.                     [facility] Adult PT Treatment/Exercise - 03/27/17 1216      Self-Care   Self-Care Other Self-Care Comments   Other Self-Care Comments  Discussion of pt. goal status.       Lumbar Exercises: Aerobic   Stationary Bike 10 min, lvl 1                   PT Short Term Goals - 03/27/17 1140      PT SHORT TERM GOAL #1   Title Independent with a HEP.   Time 4   Period Weeks   Status Achieved     PT SHORT TERM GOAL #2   Title Active lumbar extension to 10 degress.   Time 4   Period Weeks   Status Achieved           PT Long Term Goals - 03/27/17 1139      PT LONG TERM GOAL #1   Title Active lumbar extension= 15 degrees.   Time 8   Period Weeks  Status Not Met     PT LONG TERM GOAL #2   Title Walk a community distance with with pain not > 3/10.   Time 8   Period Weeks   Status Not Met  6/10 pain with walking               Plan - 03/27/17 1135    Clinical Impression Statement Pt. seen to start treatment today requesting to be d/c from therapy due to feeling therapy is no longer helping him.  Pt. able to demo lumbar extension AROM of 8 dg today and showing some improvement in lumbar flexion AROM.  Still reporting 6/10 pain while walking in community.  Continues to notes some benefit from lumbar traction thus continued today at same levels (110# max).  Pt. is now d/c from therapy.     PT Treatment/Interventions ADLs/Self Care Home Management;Electrical Stimulation;Functional mobility training;Therapeutic activities;Therapeutic exercise;Dry needling;Traction;Ultrasound;Moist Heat;Cryotherapy;Patient/family education   PT Next Visit Plan d/c       Patient will benefit from skilled therapeutic intervention in order to improve the following deficits and impairments:  Decreased activity tolerance, Decreased range of motion, Postural  dysfunction, Pain  Visit Diagnosis: Chronic midline low back pain without sciatica  Abnormal posture     Problem List Patient Active Problem List   Diagnosis Date Noted  . Back pain 11/18/2013  . Vitamin D deficiency 08/28/2013  . Hiatal hernia with gastroesophageal reflux 08/28/2013  . CAD (coronary artery disease) 04/08/2013  . Essential and other specified forms of tremor 03/31/2013  . Parkinson's 11/07/2012  . Hemorrhoids 02/17/2011  . Hyperlipidemia with target LDL less than 70   . Prostate cancer ([place])   . Hypertension   . Allergic rhinitis   . Colon polyps, history of     Keith Hill, [place] 03/27/17 12:22 PM  [facility] [address] [place], [place], [address] Phone: [phone]   Fax:  [phone]  Name: Keith Hill MRN: [id number] Date of Birth: June 23, 1932  PHYSICAL THERAPY DISCHARGE SUMMARY  Visits from Start of Care: 10.  Current functional level related to goals / functional outcomes: See above.   Remaining deficits: LTG's not met.   Education / Equipment: HEP. Plan: Patient agrees to discharge.  Patient goals were not met. Patient is being discharged due to the patient's request.  ?????         Keith Hill MPT

## 2017-04-02 ENCOUNTER — Other Ambulatory Visit: Payer: Self-pay | Admitting: Family Medicine

## 2017-04-24 ENCOUNTER — Other Ambulatory Visit: Payer: Self-pay | Admitting: Family Medicine

## 2017-04-24 ENCOUNTER — Other Ambulatory Visit: Payer: Self-pay | Admitting: Neurology

## 2017-05-07 ENCOUNTER — Other Ambulatory Visit: Payer: Self-pay | Admitting: Family Medicine

## 2017-05-08 ENCOUNTER — Other Ambulatory Visit: Payer: Medicare Other

## 2017-05-08 DIAGNOSIS — E559 Vitamin D deficiency, unspecified: Secondary | ICD-10-CM

## 2017-05-08 DIAGNOSIS — I10 Essential (primary) hypertension: Secondary | ICD-10-CM

## 2017-05-08 DIAGNOSIS — E785 Hyperlipidemia, unspecified: Secondary | ICD-10-CM

## 2017-05-09 LAB — HEPATIC FUNCTION PANEL
ALT: 35 IU/L (ref 0–44)
AST: 28 IU/L (ref 0–40)
Albumin: 4.5 g/dL (ref 3.5–4.7)
Alkaline Phosphatase: 76 IU/L (ref 39–117)
Bilirubin Total: 0.8 mg/dL (ref 0.0–1.2)
Bilirubin, Direct: 0.25 mg/dL (ref 0.00–0.40)
Total Protein: 7.2 g/dL (ref 6.0–8.5)

## 2017-05-09 LAB — BMP8+EGFR
BUN / CREAT RATIO: 14 (ref 10–24)
BUN: 19 mg/dL (ref 8–27)
CHLORIDE: 103 mmol/L (ref 96–106)
CO2: 24 mmol/L (ref 20–29)
Calcium: 9.3 mg/dL (ref 8.6–10.2)
Creatinine, Ser: 1.36 mg/dL — ABNORMAL HIGH (ref 0.76–1.27)
GFR calc non Af Amer: 48 mL/min/{1.73_m2} — ABNORMAL LOW (ref >59)
GFR, EST AFRICAN AMERICAN: 55 mL/min/{1.73_m2} — AB (ref >59)
Glucose: 92 mg/dL (ref 65–99)
POTASSIUM: 3.9 mmol/L (ref 3.5–5.2)
Sodium: 142 mmol/L (ref 134–144)

## 2017-05-09 LAB — CBC WITH DIFFERENTIAL/PLATELET
BASOS ABS: 0.1 10*3/uL (ref 0.0–0.2)
Basos: 1 %
EOS (ABSOLUTE): 0.3 10*3/uL (ref 0.0–0.4)
Eos: 4 %
Hematocrit: 40.1 % (ref 37.5–51.0)
Hemoglobin: 13.5 g/dL (ref 13.0–17.7)
IMMATURE GRANS (ABS): 0 10*3/uL (ref 0.0–0.1)
Immature Granulocytes: 0 %
LYMPHS: 27 %
Lymphocytes Absolute: 2.3 10*3/uL (ref 0.7–3.1)
MCH: 34.3 pg — AB (ref 26.6–33.0)
MCHC: 33.7 g/dL (ref 31.5–35.7)
MCV: 102 fL — ABNORMAL HIGH (ref 79–97)
Monocytes Absolute: 0.8 10*3/uL (ref 0.1–0.9)
Monocytes: 9 %
NEUTROS ABS: 5 10*3/uL (ref 1.4–7.0)
Neutrophils: 59 %
PLATELETS: 226 10*3/uL (ref 150–379)
RBC: 3.94 x10E6/uL — ABNORMAL LOW (ref 4.14–5.80)
RDW: 13.4 % (ref 12.3–15.4)
WBC: 8.4 10*3/uL (ref 3.4–10.8)

## 2017-05-09 LAB — NMR, LIPOPROFILE
CHOLESTEROL: 115 mg/dL (ref 100–199)
HDL Cholesterol by NMR: 52 mg/dL (ref >39)
HDL PARTICLE NUMBER: 29.6 umol/L — AB (ref >=30.5)
LDL PARTICLE NUMBER: 569 nmol/L (ref <1000)
LDL Size: 21 nm (ref >20.5)
LDL-C: 49 mg/dL (ref 0–99)
LP-IR SCORE: 35 (ref <=45)
Small LDL Particle Number: 334 nmol/L (ref <=527)
Triglycerides by NMR: 72 mg/dL (ref 0–149)

## 2017-05-09 LAB — VITAMIN D 25 HYDROXY (VIT D DEFICIENCY, FRACTURES): VIT D 25 HYDROXY: 51.1 ng/mL (ref 30.0–100.0)

## 2017-05-15 ENCOUNTER — Ambulatory Visit (INDEPENDENT_AMBULATORY_CARE_PROVIDER_SITE_OTHER): Payer: Medicare Other | Admitting: Family Medicine

## 2017-05-15 ENCOUNTER — Encounter: Payer: Self-pay | Admitting: Family Medicine

## 2017-05-15 VITALS — BP 101/52 | HR 52 | Temp 97.3°F | Ht 68.25 in | Wt 165.0 lb

## 2017-05-15 DIAGNOSIS — G8929 Other chronic pain: Secondary | ICD-10-CM | POA: Diagnosis not present

## 2017-05-15 DIAGNOSIS — G20 Parkinson's disease: Secondary | ICD-10-CM | POA: Diagnosis not present

## 2017-05-15 DIAGNOSIS — Z23 Encounter for immunization: Secondary | ICD-10-CM

## 2017-05-15 DIAGNOSIS — E7800 Pure hypercholesterolemia, unspecified: Secondary | ICD-10-CM | POA: Diagnosis not present

## 2017-05-15 DIAGNOSIS — E559 Vitamin D deficiency, unspecified: Secondary | ICD-10-CM | POA: Diagnosis not present

## 2017-05-15 DIAGNOSIS — I4891 Unspecified atrial fibrillation: Secondary | ICD-10-CM

## 2017-05-15 DIAGNOSIS — M545 Low back pain: Secondary | ICD-10-CM

## 2017-05-15 DIAGNOSIS — I10 Essential (primary) hypertension: Secondary | ICD-10-CM

## 2017-05-15 DIAGNOSIS — C61 Malignant neoplasm of prostate: Secondary | ICD-10-CM | POA: Diagnosis not present

## 2017-05-15 NOTE — Addendum Note (Signed)
Addended by: Zannie Cove on: 05/15/2017 12:17 PM   Modules accepted: Orders

## 2017-05-15 NOTE — Progress Notes (Signed)
Subjective:    Patient ID: Keith Hill, male    DOB: July 11, 1932, 83 y.o.   MRN: [id number]  HPI Pt here for follow up and management of chronic medical problems which includes hypertension and hyperlipidemia. He is taking medication regularly.The patient is doing well overall although he continues to have his ongoing back pain and has seen several specialists in different providers for this. He will get his flu shot today. All his vital signs are stable. He continues to be followed by his cardiologist on a regular basis and we will make sure that he takes a copy of the blood work that has been recently done with him to his next visit to see the cardiologist. All the liver function tests were normal although of the cholesterol numbers were excellent other than a decreased HDL particle number which can only be improved with better exercise and better diet habits. The blood sugar is good at 92. The creatinine the most important kidney function test was slightly lower than it was previously and this is good and he will continue to watch his NSAID by avoiding NSAIDs and keep his blood pressure under good control. All the electrolytes including potassium are good. The vitamin D level was excellent and he will stay on his current dose of vitamin D. The hemoglobin was stable at 13.5 with a normal white count. The platelet count was adequate. The patient does have prostate cancer and is been followed by the urologist regularly. Dr. Jeffie Pollock sees him about this and the patient says that his last PSA was actually lower than previously. He sees the cardiologist, Dr. Wynonia Lawman once yearly. The patient continues to have bouts of atrial fib but is on a blood thinner now. He denies any chest pain or shortness of breath. He denies any trouble with swallowing heartburn indigestion nausea vomiting diarrhea or blood in the stool. He is passing his water well and as mentioned he sees the urologist yearly. At the patient's request we  will do a referral to [place] neurosurgery for the patient and his wife because of his ongoing back pain issues which have been severely limiting to him and which she has not found any relief with this yet with the different physicians that he is saying.    Patient Active Problem List   Diagnosis Date Noted  . Back pain 11/18/2013  . Vitamin D deficiency 08/28/2013  . Hiatal hernia with gastroesophageal reflux 08/28/2013  . CAD (coronary artery disease) 04/08/2013  . Essential and other specified forms of tremor 03/31/2013  . Parkinson's 11/07/2012  . Hemorrhoids 02/17/2011  . Hyperlipidemia with target LDL less than 70   . Prostate cancer ([place])   . Hypertension   . Allergic rhinitis   . Colon polyps, history of    Outpatient Encounter Prescriptions as of 05/15/2017  Medication Sig  . amLODipine (NORVASC) 10 MG tablet TAKE (1/2) TABLET DAILY.  Marland Kitchen atorvastatin (LIPITOR) 40 MG tablet TAKE ONE TABLET AT BEDTIME  . carbidopa-levodopa (SINEMET IR) 25-100 MG tablet TAKE 1&1/2 TABLETS IN THE MORNING AND 1 AT NOON AND EVENING  . Cholecalciferol (VITAMIN D3) 5000 UNITS CAPS Take 1 capsule by mouth 4 (four) times a week. Reported on 09/14/2015  . docusate sodium (COLACE) 100 MG capsule Take 100 mg by mouth 2 (two) times daily. daily  . doxylamine, Sleep, (UNISOM) 25 MG tablet Take 12.5 mg by mouth at bedtime as needed.  Marland Kitchen ELIQUIS 5 MG TABS tablet TAKE (1) TABLET TWICE A DAY.  Marland Kitchen  fluticasone (FLONASE) 50 MCG/ACT nasal spray Place into both nostrils daily.  Marland Kitchen guaiFENesin (MUCINEX) 600 MG 12 hr tablet Take 600 mg by mouth 2 (two) times daily as needed.  [name] Oil 300 MG CAPS Take 500 mg by mouth daily. Reported on 09/14/2015  . Magnesium 250 MG TABS Take 2 tablets by mouth at bedtime as needed.   . metoprolol (LOPRESSOR) 50 MG tablet Take 50 mg by mouth daily.   . Probiotic Product ([place]) Take by mouth 1 dose over 24 hours.    . rasagiline (AZILECT) 1 MG TABS tablet Take 1  tablet (1 mg total) by mouth daily.  Marland Kitchen terazosin (HYTRIN) 2 MG capsule TAKE (1) CAPSULE DAILY AT BEDTIME.  . valsartan-hydrochlorothiazide (DIOVAN-HCT) 320-25 MG tablet TAKE 1 TABLET ONCE A DAY  . VIRT-GARD 2.2-25-1 MG TABS TAKE 1 TABLET DAILY   No facility-administered encounter medications on file as of 05/15/2017.       Review of Systems  Constitutional: Negative.   HENT: Negative.   Eyes: Negative.   Respiratory: Negative.   Cardiovascular: Negative.   Gastrointestinal: Negative.   Endocrine: Negative.   Genitourinary: Negative.   Musculoskeletal: Positive for back pain (on-going back pain).  Skin: Negative.   Allergic/Immunologic: Negative.   Neurological: Negative.   Hematological: Negative.   Psychiatric/Behavioral: Negative.        Objective:   Physical Exam  Constitutional: He is oriented to person, place, and time. He appears well-developed and well-nourished. No distress.  The patient is pleasant and alert and has a positive attitude about his health issues but the back pain is very annoying to him.   HENT:  Head: Normocephalic and atraumatic.  Right Ear: External ear normal.  Left Ear: External ear normal.  Mouth/Throat: Oropharynx is clear and moist. No oropharyngeal exudate.  Slight nasal congestion bilaterally  Eyes: Pupils are equal, round, and reactive to light. Conjunctivae and EOM are normal. Right eye exhibits no discharge. Left eye exhibits no discharge. No scleral icterus.  Neck: Normal range of motion. Neck supple. No thyromegaly present.  Cardiovascular: Normal rate and normal heart sounds.   No murmur heard. Pedal pulses in both feet were difficult to palpate. Heart is 60/m and irregular irregular  Pulmonary/Chest: Effort normal and breath sounds normal. No respiratory distress. He has no wheezes. He has no rales. He exhibits no tenderness.  Clear anteriorly and posteriorly and no axillary adenopathy.  Abdominal: Soft. Bowel sounds are normal. He  exhibits no mass. There is no tenderness. There is no rebound and no guarding.  No abdominal tenderness masses or organ enlargement or bruits and no inguinal adenopathy with good inguinal pulses.  Genitourinary:  Genitourinary Comments: The patient sees the urologist every 6 months because of his prostate cancer.  Musculoskeletal: Normal range of motion. He exhibits no edema.  The patient continues to have ongoing low back pain with any kind of activity and movement.  Lymphadenopathy:    He has no cervical adenopathy.  Neurological: He is alert and oriented to person, place, and time.  Skin: Skin is warm and dry. No rash noted.  Psychiatric: He has a normal mood and affect. His behavior is normal. Judgment and thought content normal.  Nursing note and vitals reviewed.  BP (!) 101/52 (BP Location: Left Arm)   Pulse (!) 52   Temp (!) 97.3 F (36.3 C) (Oral)   Ht 5' 8.25" (1.734 m)   Wt 165 lb (74.8 kg)   BMI 24.90 kg/m  Assessment & Plan:  1. Hypertension -The blood pressure is good today he will continue with current treatment  2. Pure hypercholesterolemia -All cholesterol numbers were excellent and he will continue with current treatment  3. Vitamin D deficiency -The vitamin D level was good and he will continue with current treatment  4. Prostate cancer ([place]) -Continue to follow-up with urology every 6 months and Dr. Jeffie Pollock  5. Parkinson's -Continue to follow-up with Dr. Jannifer Hill and continue with a positive attitude and exercise regimen as you're currently doing  6. Atrial fibrillation, unspecified type ([place]) -Continue with blood thinner and follow-up with Dr. Wynonia Lawman  7. Chronic low back pain, unspecified back pain laterality, with sciatica presence unspecified -Appointment with neurosurgeon, for further evaluation  8. Encounter for immunization - Flu vaccine HIGH DOSE PF  Patient Instructions                       Medicare Annual Wellness Visit  [facility] and the medical providers at [facility] strive to bring you the best medical care.  In doing so we not only want to address your current medical conditions and concerns but also to detect new conditions early and prevent illness, disease and health-related problems.    Medicare offers a yearly Wellness Visit which allows our clinical staff to assess your need for preventative services including immunizations, lifestyle education, counseling to decrease risk of preventable diseases and screening for fall risk and other medical concerns.    This visit is provided free of charge (no copay) for all Medicare recipients. The clinical pharmacists at [facility] have begun to conduct these Wellness Visits which will also include a thorough review of all your medications.    As you primary medical provider recommend that you make an appointment for your Annual Wellness Visit if you have not done so already this year.  You may set up this appointment before you leave today or you may call back [phone]) and schedule an appointment.  Please make sure when you call that you mention that you are scheduling your Annual Wellness Visit with the clinical pharmacist so that the appointment may be made for the proper length of time.     Continue current medications. Continue good therapeutic lifestyle changes which include good diet and exercise. Fall precautions discussed with patient. If an FOBT was given today- please return it to our front desk. If you are over 58 years old - you may need Prevnar 62 or the adult Pneumonia vaccine.  **Flu shots are available--- please call and schedule a FLU-CLINIC appointment**  After your visit with [place] today you will receive a survey in the mail or online from [employer] regarding your care with [place]. Please take a moment to fill this out. Your feedback is very important to [place] as you can help [place] better understand your patient  needs as well as improve your experience and satisfaction. WE CARE ABOUT YOU!!!   The patient should continue to follow-up with the urologist and with his cardiologist and neurologist. The flu shot that he receives today may make his arm sore. We will do a referral to Dr. Brien Few Please make sure that you take a copy of your MRI were report with you to that visit The sure and asked the neurosurgeon about what kind of physical therapy would be good for your back and he can write a prescription for this.    Arrie Senate  MD   

## 2017-05-15 NOTE — Patient Instructions (Addendum)
Medicare Annual Wellness Visit  [facility] and the medical providers at [facility] strive to bring you the best medical care.  In doing so we not only want to address your current medical conditions and concerns but also to detect new conditions early and prevent illness, disease and health-related problems.    Medicare offers a yearly Wellness Visit which allows our clinical staff to assess your need for preventative services including immunizations, lifestyle education, counseling to decrease risk of preventable diseases and screening for fall risk and other medical concerns.    This visit is provided free of charge (no copay) for all Medicare recipients. The clinical pharmacists at [facility] have begun to conduct these Wellness Visits which will also include a thorough review of all your medications.    As you primary medical provider recommend that you make an appointment for your Annual Wellness Visit if you have not done so already this year.  You may set up this appointment before you leave today or you may call back [phone]) and schedule an appointment.  Please make sure when you call that you mention that you are scheduling your Annual Wellness Visit with the clinical pharmacist so that the appointment may be made for the proper length of time.     Continue current medications. Continue good therapeutic lifestyle changes which include good diet and exercise. Fall precautions discussed with patient. If an FOBT was given today- please return it to our front desk. If you are over 70 years old - you may need Prevnar 77 or the adult Pneumonia vaccine.  **Flu shots are available--- please call and schedule a FLU-CLINIC appointment**  After your visit with [place] today you will receive a survey in the mail or online from [employer] regarding your care with [place]. Please take a moment to fill this out. Your feedback is very  important to [place] as you can help [place] better understand your patient needs as well as improve your experience and satisfaction. WE CARE ABOUT YOU!!!   The patient should continue to follow-up with the urologist and with his cardiologist and neurologist. The flu shot that he receives today may make his arm sore. We will do a referral to Dr. Brien Few Please make sure that you take a copy of your MRI were report with you to that visit The sure and asked the neurosurgeon about what kind of physical therapy would be good for your back and he can write a prescription for this.

## 2017-05-22 ENCOUNTER — Other Ambulatory Visit: Payer: Self-pay | Admitting: Family Medicine

## 2017-05-30 ENCOUNTER — Other Ambulatory Visit: Payer: Self-pay | Admitting: Family Medicine

## 2017-06-11 ENCOUNTER — Other Ambulatory Visit: Payer: Self-pay | Admitting: Family Medicine

## 2017-06-11 DIAGNOSIS — M47816 Spondylosis without myelopathy or radiculopathy, lumbar region: Secondary | ICD-10-CM | POA: Diagnosis not present

## 2017-06-11 DIAGNOSIS — M9983 Other biomechanical lesions of lumbar region: Secondary | ICD-10-CM | POA: Diagnosis not present

## 2017-06-21 ENCOUNTER — Other Ambulatory Visit: Payer: Medicare Other

## 2017-06-21 DIAGNOSIS — C61 Malignant neoplasm of prostate: Secondary | ICD-10-CM

## 2017-06-22 LAB — PSA, TOTAL AND FREE
PSA, Free Pct: 15.5 %
PSA, Free: 0.68 ng/mL
Prostate Specific Ag, Serum: 4.4 ng/mL — ABNORMAL HIGH (ref 0.0–4.0)

## 2017-06-27 DIAGNOSIS — M47816 Spondylosis without myelopathy or radiculopathy, lumbar region: Secondary | ICD-10-CM | POA: Diagnosis not present

## 2017-06-27 DIAGNOSIS — I10 Essential (primary) hypertension: Secondary | ICD-10-CM | POA: Diagnosis not present

## 2017-06-28 DIAGNOSIS — N403 Nodular prostate with lower urinary tract symptoms: Secondary | ICD-10-CM | POA: Diagnosis not present

## 2017-06-28 DIAGNOSIS — C61 Malignant neoplasm of prostate: Secondary | ICD-10-CM | POA: Diagnosis not present

## 2017-06-28 DIAGNOSIS — R351 Nocturia: Secondary | ICD-10-CM | POA: Diagnosis not present

## 2017-07-05 ENCOUNTER — Other Ambulatory Visit: Payer: Self-pay | Admitting: Family Medicine

## 2017-07-23 DIAGNOSIS — M47816 Spondylosis without myelopathy or radiculopathy, lumbar region: Secondary | ICD-10-CM | POA: Diagnosis not present

## 2017-08-02 ENCOUNTER — Encounter: Payer: Self-pay | Admitting: Family Medicine

## 2017-08-02 ENCOUNTER — Ambulatory Visit (INDEPENDENT_AMBULATORY_CARE_PROVIDER_SITE_OTHER): Payer: Medicare Other | Admitting: Family Medicine

## 2017-08-02 VITALS — BP 92/44 | HR 64 | Temp 97.5°F | Ht 68.25 in | Wt 166.0 lb

## 2017-08-02 DIAGNOSIS — J40 Bronchitis, not specified as acute or chronic: Secondary | ICD-10-CM

## 2017-08-02 DIAGNOSIS — G20 Parkinson's disease: Secondary | ICD-10-CM | POA: Diagnosis not present

## 2017-08-02 DIAGNOSIS — I4891 Unspecified atrial fibrillation: Secondary | ICD-10-CM

## 2017-08-02 DIAGNOSIS — G20A1 Parkinson's disease without dyskinesia, without mention of fluctuations: Secondary | ICD-10-CM

## 2017-08-02 MED ORDER — AZITHROMYCIN 250 MG PO TABS: ORAL_TABLET | ORAL | 0 refills | Status: DC

## 2017-08-02 NOTE — Progress Notes (Signed)
Subjective:    Patient ID: Keith Hill, male    DOB: 1932/06/15, 83 y.o.   MRN: [id number]  HPI  Patient here today for cough, congestion and sore throat that started yesterday.  The patient's wife has also been sick.  This was 5-6 days ago and she got a Z-Pak and she is better.  The patient complains of a sore throat which is getting worse.  Yesterday he developed a cough.  The patient's initial vital signs had a blood pressure was 92/44.  We will recheck this before he leaves the office.    Patient Active Problem List   Diagnosis Date Noted  . Back pain 11/18/2013  . Vitamin D deficiency 08/28/2013  . Hiatal hernia with gastroesophageal reflux 08/28/2013  . CAD (coronary artery disease) 04/08/2013  . Essential and other specified forms of tremor 03/31/2013  . Parkinson's 11/07/2012  . Hemorrhoids 02/17/2011  . Hyperlipidemia with target LDL less than 70   . Prostate cancer ([place])   . Hypertension   . Allergic rhinitis   . Colon polyps, history of    Outpatient Encounter Medications as of 08/02/2017  Medication Sig  . amLODipine (NORVASC) 10 MG tablet TAKE (1/2) TABLET DAILY.  Marland Kitchen atorvastatin (LIPITOR) 40 MG tablet TAKE ONE TABLET AT BEDTIME  . carbidopa-levodopa (SINEMET IR) 25-100 MG tablet TAKE 1&1/2 TABLETS IN THE MORNING AND 1 AT NOON AND EVENING  . Cholecalciferol (VITAMIN D3) 5000 UNITS CAPS Take 1 capsule by mouth 4 (four) times a week. Reported on 09/14/2015  . docusate sodium (COLACE) 100 MG capsule Take 100 mg by mouth 2 (two) times daily. daily  . doxylamine, Sleep, (UNISOM) 25 MG tablet Take 12.5 mg by mouth at bedtime as needed.  Marland Kitchen ELIQUIS 5 MG TABS tablet TAKE (1) TABLET TWICE A DAY.  . fluticasone (FLONASE) 50 MCG/ACT nasal spray Place into both nostrils daily.  Marland Kitchen guaiFENesin (MUCINEX) 600 MG 12 hr tablet Take 600 mg by mouth 2 (two) times daily as needed.  [name] Oil 300 MG CAPS Take 500 mg by mouth daily. Reported on 09/14/2015  . Magnesium 250 MG TABS Take 2  tablets by mouth at bedtime as needed.   . metoprolol (LOPRESSOR) 50 MG tablet Take 50 mg by mouth daily.   . Probiotic Product ([place]) Take by mouth 1 dose over 24 hours.    . rasagiline (AZILECT) 1 MG TABS tablet Take 1 tablet (1 mg total) by mouth daily.  Marland Kitchen terazosin (HYTRIN) 2 MG capsule TAKE (1) CAPSULE DAILY AT BEDTIME.  Marland Kitchen terazosin (HYTRIN) 2 MG capsule TAKE (1) CAPSULE DAILY AT BEDTIME.  . valsartan-hydrochlorothiazide (DIOVAN-HCT) 320-25 MG tablet TAKE 1 TABLET ONCE A DAY  . VIRT-GARD 2.2-25-1 MG TABS TAKE 1 TABLET DAILY   No facility-administered encounter medications on file as of 08/02/2017.      Review of Systems  Constitutional: Negative.   HENT: Positive for congestion and sore throat.   Eyes: Negative.   Respiratory: Positive for cough.   Cardiovascular: Negative.   Gastrointestinal: Negative.   Endocrine: Negative.   Genitourinary: Negative.   Musculoskeletal: Negative.   Skin: Negative.   Allergic/Immunologic: Negative.   Neurological: Negative.   Hematological: Negative.   Psychiatric/Behavioral: Negative.        Objective:   Physical Exam  Constitutional: He is oriented to person, place, and time. He appears well-developed and well-nourished. No distress.  The patient is pleasant alert and hoarse with his voice.  HENT:  Head:  Normocephalic and atraumatic.  Right Ear: External ear normal.  Left Ear: External ear normal.  Mouth/Throat: Oropharynx is clear and moist. No oropharyngeal exudate.  Nasal dryness and irritation bilaterally  Eyes: Conjunctivae and EOM are normal. Pupils are equal, round, and reactive to light. Right eye exhibits no discharge. Left eye exhibits no discharge. No scleral icterus.  Neck: Normal range of motion. Neck supple. No thyromegaly present.  No bruits or anterior cervical adenopathy  Cardiovascular: Normal rate, regular rhythm and normal heart sounds.  No murmur heard. Heart appears regular at about 72/min  today.  Pulmonary/Chest: Effort normal and breath sounds normal. No respiratory distress. He has no wheezes. He has no rales.  Dry cough  Genitourinary: Rectum normal.  Musculoskeletal: Normal range of motion. He exhibits no edema.  Lymphadenopathy:    He has no cervical adenopathy.  Neurological: He is alert and oriented to person, place, and time.  Slowness in movement  Skin: Skin is warm and dry. No rash noted.  Psychiatric: He has a normal mood and affect. His behavior is normal. Judgment and thought content normal.  Nursing note and vitals reviewed.  BP (!) 92/44 (BP Location: Left Arm)   Pulse 64   Temp (!) 97.5 F (36.4 C) (Oral)   Ht 5' 8.25" (1.734 m)   Wt 166 lb (75.3 kg)   SpO2 96%   BMI 25.06 kg/m         Assessment & Plan:  1. Bronchitis -Take Z-Pak as directed and drink plenty of fluids  2. Parkinson's -Continue to follow-up with neurology  3. Atrial fibrillation, unspecified type ([place]) -Heart was regular today and he will continue to follow-up with cardiology as planned  No orders of the defined types were placed in this encounter.  Patient Instructions  Take Mucinex maximum strength, blue and white in color, take this twice daily with a large glass of water whether you are coughing or not Take antibiotic as directed Use nasal saline each nostril 3 or 4 times daily and drink plenty of fluids  Arrie Senate MD

## 2017-08-02 NOTE — Patient Instructions (Signed)
Take Mucinex maximum strength, blue and white in color, take this twice daily with a large glass of water whether you are coughing or not Take antibiotic as directed Use nasal saline each nostril 3 or 4 times daily and drink plenty of fluids

## 2017-08-06 ENCOUNTER — Other Ambulatory Visit: Payer: Self-pay | Admitting: Family Medicine

## 2017-08-08 DIAGNOSIS — M47816 Spondylosis without myelopathy or radiculopathy, lumbar region: Secondary | ICD-10-CM | POA: Diagnosis not present

## 2017-08-08 DIAGNOSIS — I10 Essential (primary) hypertension: Secondary | ICD-10-CM | POA: Diagnosis not present

## 2017-08-27 ENCOUNTER — Telehealth: Payer: Self-pay | Admitting: Family Medicine

## 2017-08-28 DIAGNOSIS — I481 Persistent atrial fibrillation: Secondary | ICD-10-CM | POA: Diagnosis not present

## 2017-08-28 DIAGNOSIS — R0602 Shortness of breath: Secondary | ICD-10-CM | POA: Diagnosis not present

## 2017-08-28 DIAGNOSIS — I2510 Atherosclerotic heart disease of native coronary artery without angina pectoris: Secondary | ICD-10-CM | POA: Diagnosis not present

## 2017-08-28 DIAGNOSIS — I10 Essential (primary) hypertension: Secondary | ICD-10-CM | POA: Diagnosis not present

## 2017-08-28 DIAGNOSIS — G20 Parkinson's disease: Secondary | ICD-10-CM | POA: Diagnosis not present

## 2017-08-28 DIAGNOSIS — E7849 Other hyperlipidemia: Secondary | ICD-10-CM | POA: Diagnosis not present

## 2017-08-28 DIAGNOSIS — E785 Hyperlipidemia, unspecified: Secondary | ICD-10-CM | POA: Diagnosis not present

## 2017-08-28 DIAGNOSIS — Z7901 Long term (current) use of anticoagulants: Secondary | ICD-10-CM | POA: Diagnosis not present

## 2017-08-28 NOTE — Telephone Encounter (Signed)
Labs for 05-08-17,06-21-17 was faxed to Dr. Wynonia Lawman [phone]

## 2017-08-30 ENCOUNTER — Ambulatory Visit (INDEPENDENT_AMBULATORY_CARE_PROVIDER_SITE_OTHER): Payer: Medicare Other | Admitting: Neurology

## 2017-08-30 ENCOUNTER — Encounter: Payer: Self-pay | Admitting: Neurology

## 2017-08-30 VITALS — BP 116/66 | HR 70 | Ht 68.25 in | Wt 164.5 lb

## 2017-08-30 DIAGNOSIS — G20 Parkinson's disease: Secondary | ICD-10-CM

## 2017-08-30 DIAGNOSIS — M545 Low back pain: Secondary | ICD-10-CM | POA: Diagnosis not present

## 2017-08-30 DIAGNOSIS — G8929 Other chronic pain: Secondary | ICD-10-CM | POA: Diagnosis not present

## 2017-08-30 NOTE — Progress Notes (Signed)
Reason for visit: Parkinson's disease  Keith Hill is an 84 y.o. male  History of present illness:  [name] is an 84 year old right-handed white male with a history of Parkinson's disease primarily with right-sided features.  The patient returns this office for further evaluation, his main complaint is low back pain issues.  The pain is present with standing, he feels much better with sitting or lying down, he sleeps well at night.  The wife indicates that occasionally he may talk during his sleep.  The patient is works out 3 times a week for 40 minutes, he tries to stay as active as he can but his low back limits his ability to do things.  The patient is on a relatively low dose of Sinemet taking one and a half tablet in the morning, 1 tablet midday and in the evening.  The patient is also on Azilect, he is tolerating the medications well.  He denies any falls, he denies issues with choking with swallowing.  He returns for an evaluation.  He more recently has been seeing Dr. Brien Hill for pain management.  Past Medical History:  Diagnosis Date  . Allergic rhinitis, cause unspecified   . Cardiac dysrhythmia, unspecified   . Colon polyps   . Essential and other specified forms of tremor   . GERD (gastroesophageal reflux disease)   . Hemorrhoids, external   . Hiatal hernia   . Hyperlipidemia   . Hyperplasia of prostate   . Hypertension   . Lumbar disc disease   . Pancreatitis 1976  . Paralysis agitans ([place]) 11/07/2012  . Parkinson disease ([place])   . Peyronie's disease   . Prostate cancer [facility])    prostate    Past Surgical History:  Procedure Laterality Date  . HEMORRHOID SURGERY    . KNEE SURGERY Left   . PROSTATE BIOPSY  04/03/2013  . PROSTATE BIOPSY  11/01/2011  . SHOULDER SURGERY Left     Family History  Problem Relation Age of Onset  . Kidney failure Father   . Alcoholism Brother   . Heart attack Brother   . Hyperlipidemia Son   . Cancer Neg Hx     Social history:   reports that  has never smoked. he has never used smokeless tobacco. He reports that he does not drink alcohol or use drugs.    Allergies  Allergen Reactions  . Aspirin     Upsets stomach  . Calicum Glycerophosphate   . Niaspan [Niacin Er]   . Zocor [Simvastatin]     Medications:  Prior to Admission medications   Medication Sig Start Date End Date Taking? Authorizing Provider  amLODipine (NORVASC) 10 MG tablet TAKE (1/2) TABLET DAILY. 01/17/17  Yes Chipper Herb, MD  atorvastatin (LIPITOR) 40 MG tablet TAKE ONE TABLET AT BEDTIME Patient taking differently: TAKE ONE TABLET AT BEDTIME (Sunday, Wednesday, Friday- 20mg ), other days-10mg  06/12/17  Yes Chipper Herb, MD  carbidopa-levodopa (SINEMET IR) 25-100 MG tablet TAKE 1&1/2, 1 at midday and 1 in the evening AND 1 AT [place] 04/24/17  Yes Kathrynn Ducking, MD  Cholecalciferol (VITAMIN D3) 5000 UNITS CAPS Take 1 capsule by mouth 4 (four) times a week. Reported on 09/14/2015   Yes [provider]  docusate sodium (COLACE) 100 MG capsule Take 100 mg by mouth 2 (two) times daily. daily   Yes [provider]  doxylamine, Sleep, (UNISOM) 25 MG tablet Take 12.5 mg by mouth at bedtime as needed.  Yes [provider]  ELIQUIS 5 MG TABS tablet TAKE (1) TABLET TWICE A DAY. 05/30/17  Yes Chipper Herb, MD  fluticasone [facility]) 50 MCG/ACT nasal spray Place into both nostrils daily.   Yes [provider]  guaiFENesin (MUCINEX) 600 MG 12 hr tablet Take 600 mg by mouth 2 (two) times daily as needed.   Yes [provider]  [name] Oil 300 MG CAPS Take 500 mg by mouth daily. Reported on 09/14/2015   Yes [provider]  Magnesium 250 MG TABS Take 2 tablets by mouth at bedtime as needed.    Yes [provider]  metoprolol (LOPRESSOR) 50 MG tablet Take 50 mg by mouth daily.    Yes [provider]  Probiotic Product ([place]) Take by mouth 1 dose over 24 hours.      Yes [provider]  rasagiline (AZILECT) 1 MG TABS tablet Take 1 tablet (1 mg total) by mouth daily. 08/29/16  Yes Kathrynn Ducking, MD  terazosin (HYTRIN) 2 MG capsule TAKE (1) CAPSULE DAILY AT BEDTIME. 04/03/17  Yes Chipper Herb, MD  terazosin (HYTRIN) 2 MG capsule TAKE (1) CAPSULE DAILY AT BEDTIME. 07/05/17  Yes Chipper Herb, MD  valsartan-hydrochlorothiazide (DIOVAN-HCT) 320-25 MG tablet TAKE 1 TABLET ONCE A DAY 05/23/17  Yes Chipper Herb, MD  VIRT-GARD 2.2-25-1 MG TABS TAKE 1 TABLET DAILY 08/07/17  Yes Chipper Herb, MD    ROS:  Out of a complete 14 system review of symptoms, the patient complains only of the following symptoms, and all other reviewed systems are negative.  Back pain  Blood pressure 116/66, pulse 70, height 5' 8.25" (1.734 m), weight 164 lb 8 oz (74.6 kg).  Physical Exam  General: The patient is alert and cooperative at the time of the examination.  Skin: No significant peripheral edema is noted.   Neurologic Exam  Mental status: The patient is alert and oriented x 3 at the time of the examination. The patient has apparent normal recent and remote memory, with an apparently normal attention span and concentration ability.   Cranial nerves: Facial symmetry is present. Speech is normal, no aphasia or dysarthria is noted. Extraocular movements are full. Visual fields are full.  Mild masking of the face is seen.  Motor: The patient has good strength in all 4 extremities.  Sensory examination: Soft touch sensation is symmetric on the face, arms, and legs.  Coordination: The patient has good finger-nose-finger and heel-to-shin bilaterally.  Resting tremors are noted on the right upper extremity.  Gait and station: The patient has the ability to stand up with his arms crossed from a seated position.  The patient has a stooped posture, he can walk independently, arm swing is slightly decreased on the right as compared to the left, tremor seen  with walking with the right arm.  Tandem gait is unsteady.  Romberg is negative.  Reflexes: Deep tendon reflexes are symmetric.   Assessment/Plan:  1.  Parkinson's disease  2.  Chronic low back pain  The patient is doing a good job trying to stay as active as possible.  He will continue his current dose of Sinemet, he has no significant mobility issues with Parkinson's disease.  He will follow-up in about 6 months.  Jill Alexanders MD 08/30/2017 8:36 AM  [facility] [address] [place], [place] [address]  Phone [phone] Fax [phone]

## 2017-09-26 DIAGNOSIS — H3589 Other specified retinal disorders: Secondary | ICD-10-CM | POA: Diagnosis not present

## 2017-09-26 DIAGNOSIS — H43813 Vitreous degeneration, bilateral: Secondary | ICD-10-CM | POA: Diagnosis not present

## 2017-09-26 DIAGNOSIS — H353132 Nonexudative age-related macular degeneration, bilateral, intermediate dry stage: Secondary | ICD-10-CM | POA: Diagnosis not present

## 2017-09-26 DIAGNOSIS — H25813 Combined forms of age-related cataract, bilateral: Secondary | ICD-10-CM | POA: Diagnosis not present

## 2017-09-26 DIAGNOSIS — H40013 Open angle with borderline findings, low risk, bilateral: Secondary | ICD-10-CM | POA: Diagnosis not present

## 2017-09-26 DIAGNOSIS — H04123 Dry eye syndrome of bilateral lacrimal glands: Secondary | ICD-10-CM | POA: Diagnosis not present

## 2017-09-26 DIAGNOSIS — H40033 Anatomical narrow angle, bilateral: Secondary | ICD-10-CM | POA: Diagnosis not present

## 2017-09-26 DIAGNOSIS — H16143 Punctate keratitis, bilateral: Secondary | ICD-10-CM | POA: Diagnosis not present

## 2017-10-01 DIAGNOSIS — H25812 Combined forms of age-related cataract, left eye: Secondary | ICD-10-CM | POA: Diagnosis not present

## 2017-10-01 DIAGNOSIS — H2512 Age-related nuclear cataract, left eye: Secondary | ICD-10-CM | POA: Diagnosis not present

## 2017-10-02 ENCOUNTER — Other Ambulatory Visit: Payer: Self-pay | Admitting: Family Medicine

## 2017-10-23 ENCOUNTER — Other Ambulatory Visit: Payer: Medicare Other

## 2017-10-23 DIAGNOSIS — E7800 Pure hypercholesterolemia, unspecified: Secondary | ICD-10-CM | POA: Diagnosis not present

## 2017-10-23 DIAGNOSIS — I10 Essential (primary) hypertension: Secondary | ICD-10-CM | POA: Diagnosis not present

## 2017-10-23 DIAGNOSIS — E559 Vitamin D deficiency, unspecified: Secondary | ICD-10-CM

## 2017-10-24 LAB — CBC WITH DIFFERENTIAL/PLATELET
Basophils Absolute: 0.1 10*3/uL (ref 0.0–0.2)
Basos: 1 %
EOS (ABSOLUTE): 0.4 10*3/uL (ref 0.0–0.4)
Eos: 5 %
HEMATOCRIT: 40.6 % (ref 37.5–51.0)
Hemoglobin: 13.7 g/dL (ref 13.0–17.7)
IMMATURE GRANULOCYTES: 0 %
Immature Grans (Abs): 0 10*3/uL (ref 0.0–0.1)
LYMPHS ABS: 1.6 10*3/uL (ref 0.7–3.1)
LYMPHS: 22 %
MCH: 33.7 pg — ABNORMAL HIGH (ref 26.6–33.0)
MCHC: 33.7 g/dL (ref 31.5–35.7)
MCV: 100 fL — AB (ref 79–97)
MONOCYTES: 12 %
Monocytes Absolute: 0.9 10*3/uL (ref 0.1–0.9)
Neutrophils Absolute: 4.4 10*3/uL (ref 1.4–7.0)
Neutrophils: 60 %
PLATELETS: 215 10*3/uL (ref 150–379)
RBC: 4.07 x10E6/uL — AB (ref 4.14–5.80)
RDW: 13.6 % (ref 12.3–15.4)
WBC: 7.3 10*3/uL (ref 3.4–10.8)

## 2017-10-24 LAB — NMR, LIPOPROFILE
CHOLESTEROL, TOTAL: 118 mg/dL (ref 100–199)
HDL Particle Number: 31.4 umol/L (ref >=30.5)
HDL-C: 59 mg/dL (ref >39)
LDL PARTICLE NUMBER: 443 nmol/L (ref <1000)
LDL Size: 20.7 nm (ref >20.5)
LDL-C: 50 mg/dL (ref 0–99)
LP-IR SCORE: 25 (ref <=45)
Small LDL Particle Number: 189 nmol/L (ref <=527)
TRIGLYCERIDES: 44 mg/dL (ref 0–149)

## 2017-10-24 LAB — HEPATIC FUNCTION PANEL
ALT: 33 IU/L (ref 0–44)
AST: 26 IU/L (ref 0–40)
Albumin: 4.3 g/dL (ref 3.5–4.7)
Alkaline Phosphatase: 84 IU/L (ref 39–117)
Bilirubin Total: 0.6 mg/dL (ref 0.0–1.2)
Bilirubin, Direct: 0.2 mg/dL (ref 0.00–0.40)
Total Protein: 7.4 g/dL (ref 6.0–8.5)

## 2017-10-24 LAB — BMP8+EGFR
BUN/Creatinine Ratio: 17 (ref 10–24)
BUN: 22 mg/dL (ref 8–27)
CALCIUM: 9.7 mg/dL (ref 8.6–10.2)
CO2: 22 mmol/L (ref 20–29)
CREATININE: 1.29 mg/dL — AB (ref 0.76–1.27)
Chloride: 104 mmol/L (ref 96–106)
GFR calc Af Amer: 58 mL/min/{1.73_m2} — ABNORMAL LOW (ref >59)
GFR calc non Af Amer: 51 mL/min/{1.73_m2} — ABNORMAL LOW (ref >59)
GLUCOSE: 95 mg/dL (ref 65–99)
Potassium: 4.8 mmol/L (ref 3.5–5.2)
SODIUM: 145 mmol/L — AB (ref 134–144)

## 2017-10-24 LAB — VITAMIN D 25 HYDROXY (VIT D DEFICIENCY, FRACTURES): Vit D, 25-Hydroxy: 49.2 ng/mL (ref 30.0–100.0)

## 2017-10-26 IMAGING — MR MR LUMBAR SPINE W/O CM
4 of 5 series · 15 of 48 positions shown · non-contrast
Comparison: 07/16/2012

CLINICAL DATA: Low back pain and weakness for over 3 years. History
of prostate cancer.

EXAM:
MRI LUMBAR SPINE WITHOUT CONTRAST
TECHNIQUE: Multiplanar, multisequence MR imaging of the lumbar spine was
performed. No intravenous contrast was administered.

[Series 3: T2 · sagittal · 4.0mm · 0.80mm/px · 6 of 17 slices shown (1 of 2)]
[im 1/17]
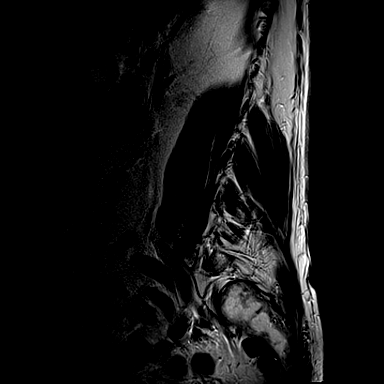
[im 4/17]
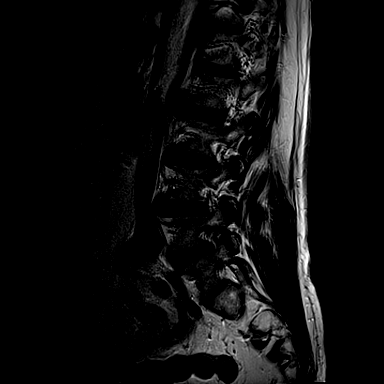
[im 7/17]
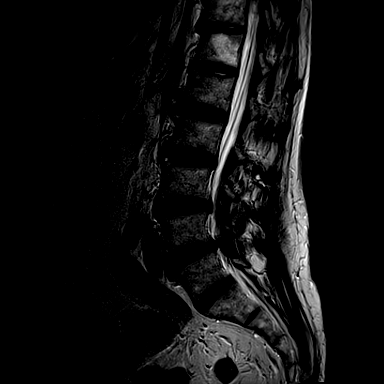
[im 10/17]
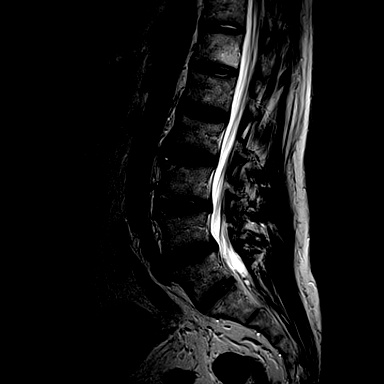
[im 13/17]
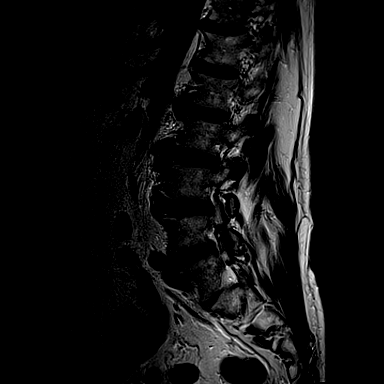
[im 17/17]
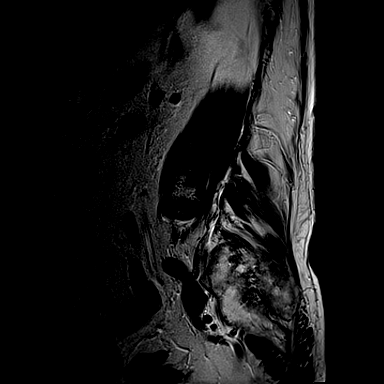

[Series 4: T1 · sagittal · 4.0mm · 0.40mm/px · 3 of 17 slices shown (1 of 2)]
[im 4/17]
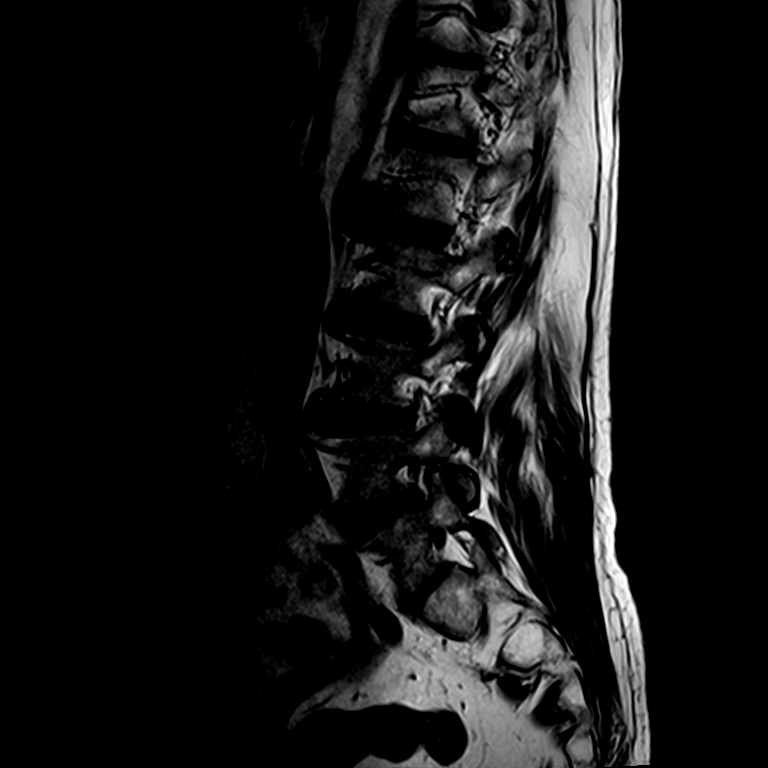
[im 10/17]
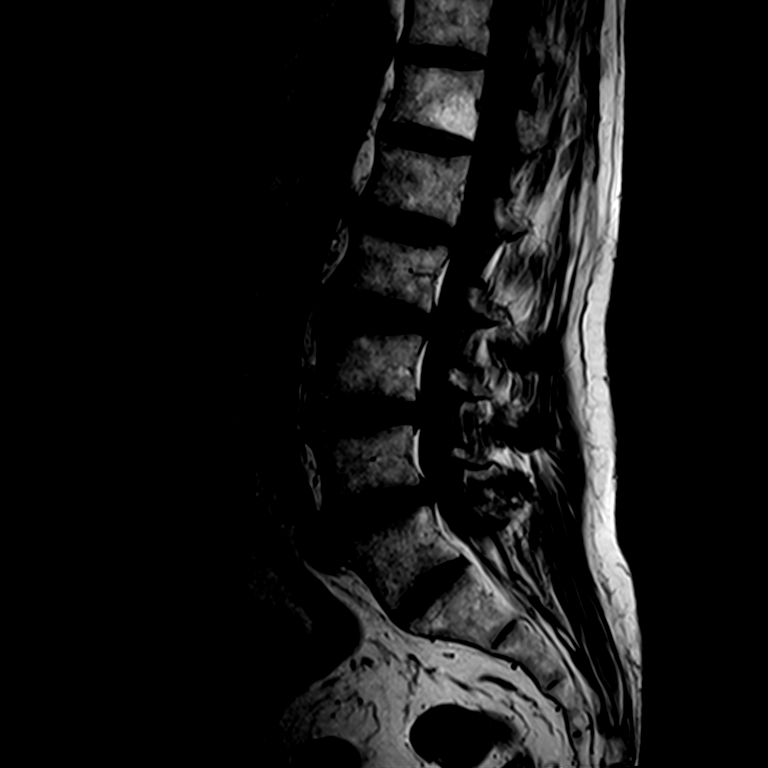
[im 17/17]
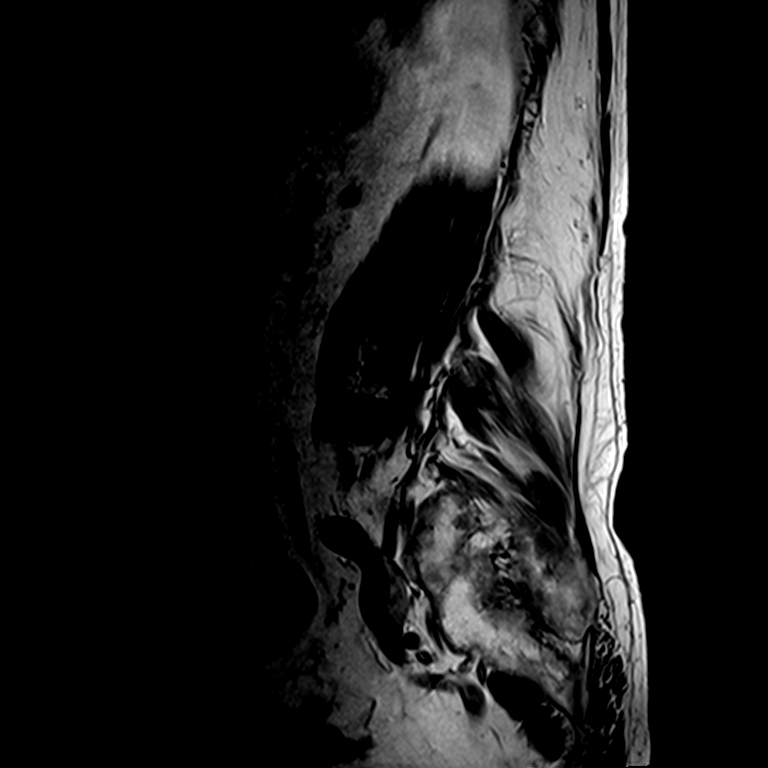

[Series 6: T2 · axial · 4.0mm · 0.28mm/px · z∈[-180,+1]mm · 3 of 46 slices shown (2 of 2)]
[im 7/46]
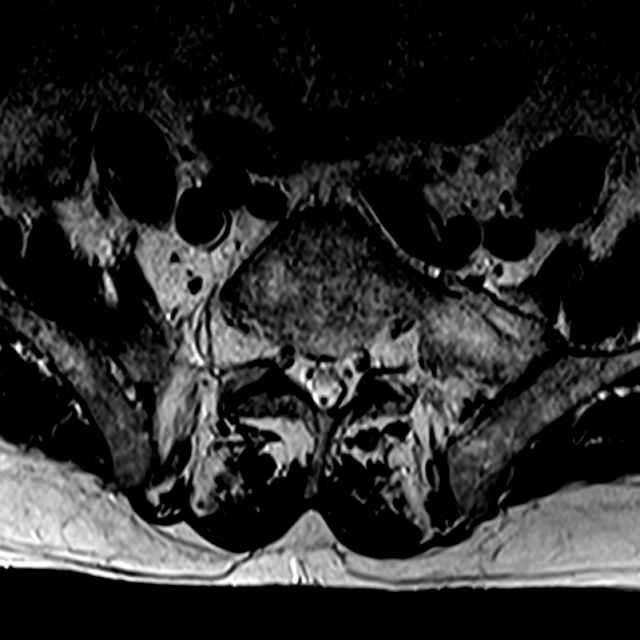
[im 23/46]
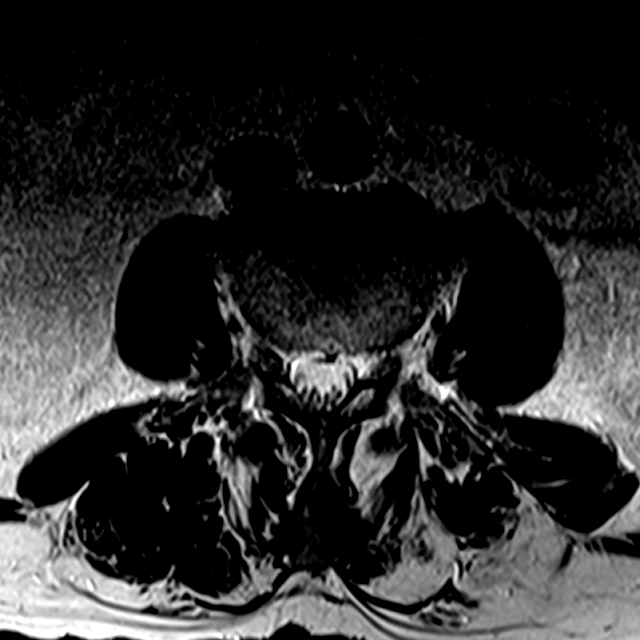
[im 39/46]
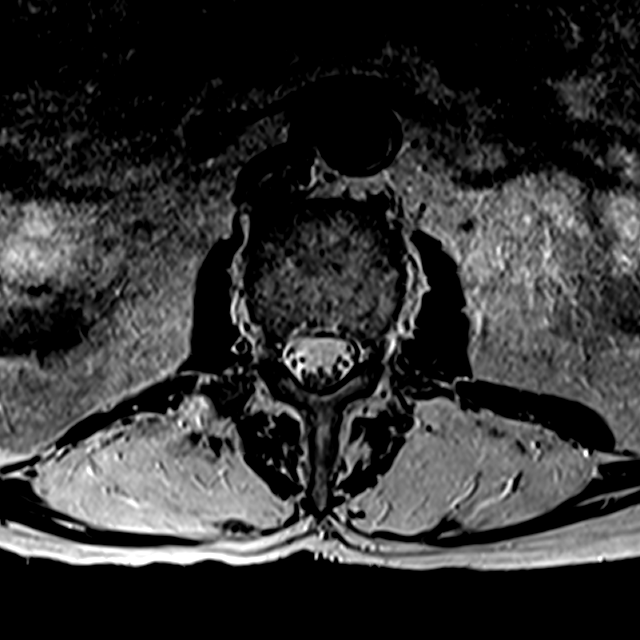

[Series 7: T1 · axial · 4.0mm · 0.28mm/px · z∈[-180,+1]mm · 3 of 46 slices shown (2 of 2)]
[im 7/46]
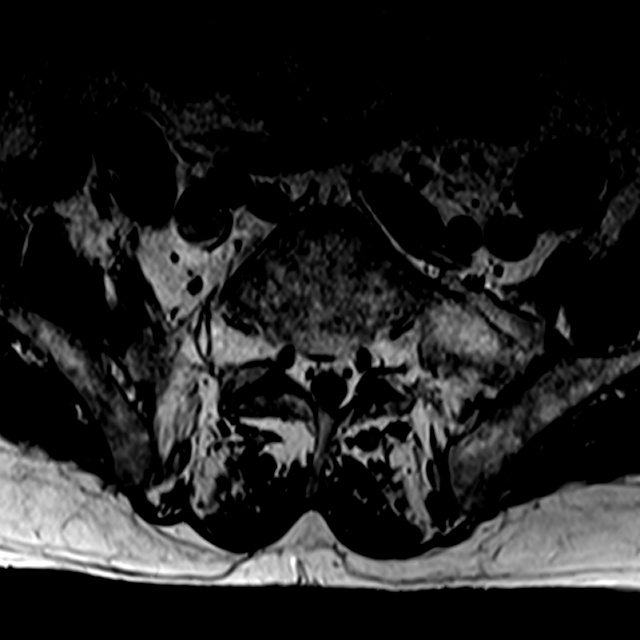
[im 23/46]
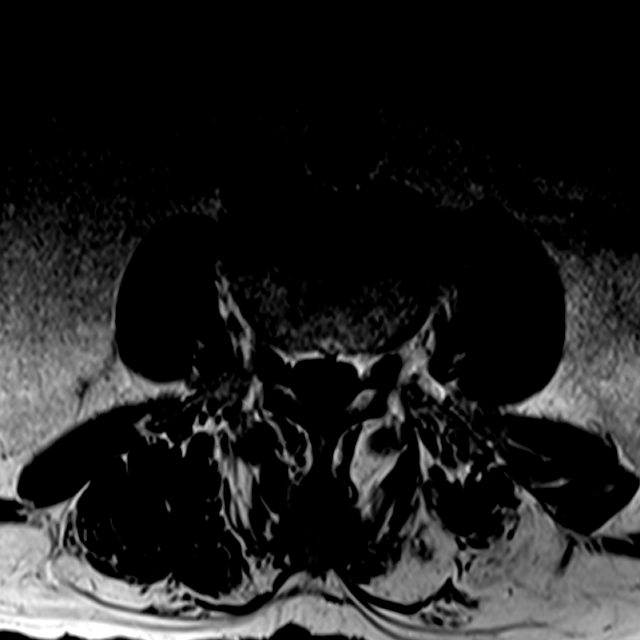
[im 39/46]
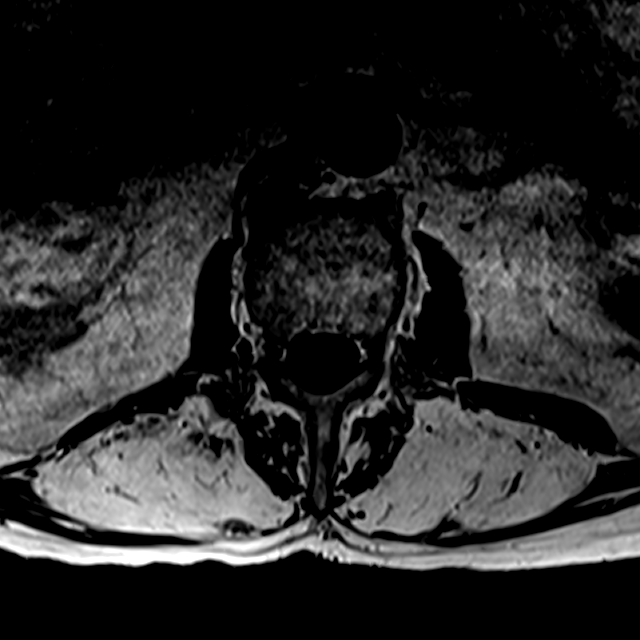

[15 of 48 positions shown; findings below may reference images not displayed]

FINDINGS: Transitional lumbosacral anatomy is again noted. Numbering will be
preserved from the prior MRI, with the transitional segment
considered a partially lumbarized S1.

Vertebral alignment is unchanged and within normal limits. Vertebral
body heights are preserved. L1 vertebral body hemangiomas are
unchanged. No significant vertebral marrow edema or suspicious
osseous lesion is seen. There is diffuse lumbar disc desiccation.
The conus medullaris is normal in signal and terminates at L1.
Paraspinal soft tissues are unremarkable.

L1-2:  Negative.

L2-3: Minimal disc bulging and minimal facet hypertrophy result in
minimal left lateral recess narrowing without spinal canal or neural
foraminal stenosis, unchanged.

L3-4: Mild disc bulging and mild facet hypertrophy result in mild
left greater than right lateral recess stenosis without significant
spinal canal or neural foraminal stenosis, unchanged.

L4-5: Mild disc bulging, endplate spurring, and mild facet
hypertrophy result in mild bilateral lateral recess stenosis and
mild right and minimal left neural foraminal stenosis without
significant spinal stenosis, unchanged.

L5-S1: Mild disc bulging, superimposed right subarticular disc
protrusion with annular fissure, and mild facet hypertrophy result
in moderate right greater than left lateral recess stenosis and mild
right neural foraminal stenosis without significant spinal stenosis,
unchanged.

S1-2:  Mild facet arthrosis without stenosis, unchanged.
IMPRESSION: 1. Transitional lumbosacral anatomy as above.
2. Unchanged lumbar disc and facet degeneration resulting in lateral
recess stenosis as above, greatest at L5-S1.
3. No evidence of metastatic disease on this unenhanced study.

## 2017-10-29 ENCOUNTER — Ambulatory Visit (INDEPENDENT_AMBULATORY_CARE_PROVIDER_SITE_OTHER): Payer: Medicare Other

## 2017-10-29 ENCOUNTER — Ambulatory Visit (INDEPENDENT_AMBULATORY_CARE_PROVIDER_SITE_OTHER): Payer: Medicare Other | Admitting: Family Medicine

## 2017-10-29 ENCOUNTER — Encounter: Payer: Self-pay | Admitting: Family Medicine

## 2017-10-29 VITALS — BP 94/52 | HR 70 | Temp 97.1°F | Ht 68.25 in | Wt 163.0 lb

## 2017-10-29 DIAGNOSIS — C61 Malignant neoplasm of prostate: Secondary | ICD-10-CM | POA: Diagnosis not present

## 2017-10-29 DIAGNOSIS — N183 Chronic kidney disease, stage 3 unspecified: Secondary | ICD-10-CM

## 2017-10-29 DIAGNOSIS — L989 Disorder of the skin and subcutaneous tissue, unspecified: Secondary | ICD-10-CM | POA: Diagnosis not present

## 2017-10-29 DIAGNOSIS — E7800 Pure hypercholesterolemia, unspecified: Secondary | ICD-10-CM

## 2017-10-29 DIAGNOSIS — I4891 Unspecified atrial fibrillation: Secondary | ICD-10-CM

## 2017-10-29 DIAGNOSIS — G20 Parkinson's disease: Secondary | ICD-10-CM

## 2017-10-29 DIAGNOSIS — E559 Vitamin D deficiency, unspecified: Secondary | ICD-10-CM | POA: Diagnosis not present

## 2017-10-29 DIAGNOSIS — I700 Atherosclerosis of aorta: Secondary | ICD-10-CM | POA: Insufficient documentation

## 2017-10-29 DIAGNOSIS — G8929 Other chronic pain: Secondary | ICD-10-CM

## 2017-10-29 DIAGNOSIS — I10 Essential (primary) hypertension: Secondary | ICD-10-CM

## 2017-10-29 DIAGNOSIS — M545 Low back pain: Secondary | ICD-10-CM

## 2017-10-29 DIAGNOSIS — I1310 Hypertensive heart and chronic kidney disease without heart failure, with stage 1 through stage 4 chronic kidney disease, or unspecified chronic kidney disease: Secondary | ICD-10-CM | POA: Insufficient documentation

## 2017-10-29 DIAGNOSIS — G20A1 Parkinson's disease without dyskinesia, without mention of fluctuations: Secondary | ICD-10-CM

## 2017-10-29 NOTE — Progress Notes (Signed)
Subjective:    Patient ID: Keith Hill, male    DOB: 11/01/1931, 84 y.o.   MRN: [id number]  HPI Pt here for follow up and management of chronic medical problems which includes hyperlipidemia and hypertension. He is taking medication regularly.  The patient is doing well overall but continues to have his back pain.  Both he and his wife have problems with her back's.  The patient does do physical therapy regularly.  He has Parkinson's.  He will be given an FOBT to return and will get a chest x-ray today and he has had lab work and we will review this with him during the visit.  He is followed regularly by the cardiologist and the urologist.  With his blood work, all cholesterol numbers were good and within normal limits.  The BMP had a normal blood sugar and continues to have an elevated creatinine at 1.29.  The electrolytes are good except the sodium was slightly increased but the potassium was normal.  All cholesterol numbers with advanced lipid testing were excellent with an LDL particle number being 443 and LDL C being 50 and triglycerides being good at 44.  The good cholesterol was within normal limits this time.  The CBC had a normal white blood cell count with a good hemoglobin and adequate platelet count.  Vitamin D level was excellent at 49.2.  The patient denies any chest pain pressure tightness or shortness of breath anymore than usual.  He denies any trouble with his stomach including nausea vomiting heartburn blood in the stool or black tarry bowel movements.  He is passing his water without problems.  He does has nocturia 2-3 times nightly.  He has an upcoming appointment with his urologist in a couple of months and he says the urologist typically likes for him to have a PSA before he sees him.  He will come back to this office to get that test prior to his visit with a urologist.  He is pleasant and alert and still complains with his back and does do physical therapy regularly.  His wife is  going to see the orthopedic specialist and we will asked the patient to ask him to see the patient also.     Patient Active Problem List   Diagnosis Date Noted  . Back pain 11/18/2013  . Vitamin D deficiency 08/28/2013  . Hiatal hernia with gastroesophageal reflux 08/28/2013  . CAD (coronary artery disease) 04/08/2013  . Essential and other specified forms of tremor 03/31/2013  . Parkinson's 11/07/2012  . Hemorrhoids 02/17/2011  . Hyperlipidemia with target LDL less than 70   . Prostate cancer ([employer])   . Hypertension   . Allergic rhinitis   . Colon polyps, history of    Outpatient Encounter Medications as of 10/29/2017  Medication Sig  . amLODipine (NORVASC) 10 MG tablet TAKE (1/2) TABLET DAILY.  Marland Kitchen atorvastatin (LIPITOR) 40 MG tablet TAKE ONE TABLET AT BEDTIME (Patient taking differently: TAKE ONE TABLET AT BEDTIME (Sunday, Wednesday, Friday- 20mg ), other days-10mg )  . carbidopa-levodopa (SINEMET IR) 25-100 MG tablet TAKE 1&1/2 TABLETS IN THE MORNING AND 1 AT NOON AND EVENING  . Cholecalciferol (VITAMIN D3) 5000 UNITS CAPS Take 1 capsule by mouth 4 (four) times a week. Reported on 09/14/2015  . docusate sodium (COLACE) 100 MG capsule Take 100 mg by mouth 2 (two) times daily. daily  . doxylamine, Sleep, (UNISOM) 25 MG tablet Take 12.5 mg by mouth at bedtime as needed.  Marland Kitchen ELIQUIS 5 MG  TABS tablet TAKE (1) TABLET TWICE A DAY.  . fluticasone (FLONASE) 50 MCG/ACT nasal spray Place into both nostrils daily.  Marland Kitchen guaiFENesin (MUCINEX) 600 MG 12 hr tablet Take 600 mg by mouth 2 (two) times daily as needed.  [name] Oil 300 MG CAPS Take 500 mg by mouth daily. Reported on 09/14/2015  . Magnesium 250 MG TABS Take 2 tablets by mouth at bedtime as needed.   . metoprolol (LOPRESSOR) 50 MG tablet Take 50 mg by mouth daily.   . Probiotic Product ([place]) Take by mouth 1 dose over 24 hours.    . rasagiline (AZILECT) 1 MG TABS tablet Take 1 tablet (1 mg total) by mouth daily.  Marland Kitchen  terazosin (HYTRIN) 2 MG capsule TAKE (1) CAPSULE DAILY AT BEDTIME.  Marland Kitchen terazosin (HYTRIN) 2 MG capsule TAKE (1) CAPSULE DAILY AT BEDTIME.  . valsartan-hydrochlorothiazide (DIOVAN-HCT) 320-25 MG tablet TAKE 1 TABLET ONCE A DAY  . VIRT-GARD 2.2-25-1 MG TABS TAKE 1 TABLET DAILY   No facility-administered encounter medications on file as of 10/29/2017.      Review of Systems  Constitutional: Negative.   HENT: Negative.   Eyes: Negative.   Respiratory: Negative.   Cardiovascular: Negative.   Gastrointestinal: Negative.   Endocrine: Negative.   Musculoskeletal: Positive for back pain.  Skin: Negative.   Allergic/Immunologic: Negative.   Neurological: Negative.   Hematological: Negative.   Psychiatric/Behavioral: Negative.        Objective:   Physical Exam  Constitutional: He is oriented to person, place, and time. He appears well-developed and well-nourished. No distress.  The patient is pleasant and alert  HENT:  Head: Normocephalic and atraumatic.  Right Ear: External ear normal.  Left Ear: External ear normal.  Nose: Nose normal.  Mouth/Throat: Oropharynx is clear and moist. No oropharyngeal exudate.  Eyes: Conjunctivae and EOM are normal. Pupils are equal, round, and reactive to light. Right eye exhibits no discharge. Left eye exhibits no discharge. No scleral icterus.  Neck: Normal range of motion. Neck supple. No thyromegaly present.  No bruits thyromegaly or anterior cervical adenopathy  Cardiovascular: Normal rate, normal heart sounds and intact distal pulses.  No murmur heard. Heart is slightly irregular at 72/min  Pulmonary/Chest: Effort normal and breath sounds normal. No respiratory distress. He has no wheezes. He has no rales. He exhibits no tenderness.  Clear anteriorly and posteriorly and no axillary adenopathy and no chest wall masses  Abdominal: Soft. Bowel sounds are normal. He exhibits no mass. There is no tenderness. There is no rebound and no guarding.  No  abdominal tenderness liver or spleen enlargement or inguinal adenopathy  Genitourinary:  Genitourinary Comments: The patient sees the urologist in the next couple of months because of his prostate cancer.  Musculoskeletal: Normal range of motion. He exhibits no edema.  Lymphadenopathy:    He has no cervical adenopathy.  Neurological: He is alert and oriented to person, place, and time. He has normal reflexes. No cranial nerve deficit.  No noticeable tremor today.  Patient is alert.  Skin: Skin is warm and dry. No rash noted.  There is an ulcerated area on his right flank and we will arrange for him to see a dermatologist about this because this is been there for several months.  Psychiatric: He has a normal mood and affect. His behavior is normal. Judgment and thought content normal.  Nursing note and vitals reviewed.  BP (!) 94/52 (BP Location: Left Arm)   Pulse 70  Temp (!) 97.1 F (36.2 C) (Oral)   Ht 5' 8.25" (1.734 m)   Wt 163 lb (73.9 kg)   BMI 24.60 kg/m         Assessment & Plan:  1. Pure hypercholesterolemia -Cholesterol numbers were excellent and patient will continue with current treatment - DG Chest 2 View; Future  2. Hypertension -Pressure is good he will continue with current treatment - DG Chest 2 View; Future  3. Atrial fibrillation, unspecified type ([place]) -Patient remains in atrial fib but with good rate control at about 72/min - DG Chest 2 View; Future  4. Parkinson's -Continue to follow-up with neurology  5. Vitamin D deficiency -Vitamin D level was good he will continue with current treatment  6. Prostate cancer ([place]) -Continue to follow-up with urology  7. 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  The creatinine is stable compared to previous readings.  8. Chronic low back pain, unspecified back pain laterality, with sciatica presence  unspecified -Follow-up with orthopedics as planned  9. Parkinson disease ([place]) -Continue to follow-up with neurology and with exercise regimen  10. Skin lesion of back -Refer to [facility] dermatology  Patient Instructions                       Medicare Annual Wellness Visit  [facility] and the medical providers at [facility] strive to bring you the best medical care.  In doing so we not only want to address your current medical conditions and concerns but also to detect new conditions early and prevent illness, disease and health-related problems.    Medicare offers a yearly Wellness Visit which allows our clinical staff to assess your need for preventative services including immunizations, lifestyle education, counseling to decrease risk of preventable diseases and screening for fall risk and other medical concerns.    This visit is provided free of charge (no copay) for all Medicare recipients. The clinical pharmacists at [facility] have begun to conduct these Wellness Visits which will also include a thorough review of all your medications.    As you primary medical provider recommend that you make an appointment for your Annual Wellness Visit if you have not done so already this year.  You may set up this appointment before you leave today or you may call back ([phone]) and schedule an appointment.  Please make sure when you call that you mention that you are scheduling your Annual Wellness Visit with the clinical pharmacist so that the appointment may be made for the proper length of time.     Continue current medications. Continue good therapeutic lifestyle changes which include good diet and exercise. Fall precautions discussed with patient. If an FOBT was given today- please return it to our front desk. If you are over 46 years old - you may need Prevnar 74 or the adult Pneumonia vaccine.  **Flu shots are available--- please call  and schedule a FLU-CLINIC appointment**  After your visit with [place] today you will receive a survey in the mail or online from [employer] regarding your care with [place]. Please take a moment to fill this out. Your feedback is very important to [place] as you can help [place] better understand your patient needs as well as improve your experience and satisfaction. WE CARE ABOUT YOU!!!   Continue with physical therapy Continue follow-up with urology  and neurology and cardiology Take lab work with you especially to the urologist Remember the flu season is still here and make sure you practice good hand hygiene and use good respiratory protection  Arrie Senate MD

## 2017-10-29 NOTE — Patient Instructions (Addendum)
Medicare Annual Wellness Visit  [facility] and the medical providers at [facility] strive to bring you the best medical care.  In doing so we not only want to address your current medical conditions and concerns but also to detect new conditions early and prevent illness, disease and health-related problems.    Medicare offers a yearly Wellness Visit which allows our clinical staff to assess your need for preventative services including immunizations, lifestyle education, counseling to decrease risk of preventable diseases and screening for fall risk and other medical concerns.    This visit is provided free of charge (no copay) for all Medicare recipients. The clinical pharmacists at [facility] have begun to conduct these Wellness Visits which will also include a thorough review of all your medications.    As you primary medical provider recommend that you make an appointment for your Annual Wellness Visit if you have not done so already this year.  You may set up this appointment before you leave today or you may call back ([phone]) and schedule an appointment.  Please make sure when you call that you mention that you are scheduling your Annual Wellness Visit with the clinical pharmacist so that the appointment may be made for the proper length of time.     Continue current medications. Continue good therapeutic lifestyle changes which include good diet and exercise. Fall precautions discussed with patient. If an FOBT was given today- please return it to our front desk. If you are over 76 years old - you may need Prevnar 13 or the adult Pneumonia vaccine.  **Flu shots are available--- please call and schedule a FLU-CLINIC appointment**  After your visit with [place] today you will receive a survey in the mail or online from [employer] regarding your care with [place]. Please take a moment to fill this out. Your feedback is very  important to [place] as you can help [place] better understand your patient needs as well as improve your experience and satisfaction. WE CARE ABOUT YOU!!!   Continue with physical therapy Continue follow-up with urology and neurology and cardiology Take lab work with you especially to the urologist Remember the flu season is still here and make sure you practice good hand hygiene and use good respiratory protection

## 2017-10-31 ENCOUNTER — Other Ambulatory Visit: Payer: Medicare Other

## 2017-10-31 DIAGNOSIS — Z1211 Encounter for screening for malignant neoplasm of colon: Secondary | ICD-10-CM | POA: Diagnosis not present

## 2017-11-03 LAB — FECAL OCCULT BLOOD, IMMUNOCHEMICAL: FECAL OCCULT BLD: NEGATIVE

## 2017-11-05 ENCOUNTER — Other Ambulatory Visit: Payer: Self-pay | Admitting: Neurology

## 2017-11-27 NOTE — Progress Notes (Signed)
[facility] Note  11/28/2017     CHIEF COMPLAINT Patient presents for Retina Evaluation   HISTORY OF PRESENT ILLNESS: Keith Hill is a 84 y.o. male who presents to the clinic today for:   HPI    Retina Evaluation    In both eyes.  This started 2 months ago.  Associated Symptoms Negative for Flashes, Pain, Trauma, Fever, Floaters, Redness, Scalp Tenderness, Weight Loss, Distortion, Photophobia, Jaw Claudication, Fatigue, Blind Spot, Glare and Shoulder/Hip pain.  Context:  distance vision, mid-range vision and near vision.  Treatments tried include surgery.  Response to treatment was significant improvement.  I, the attending physician,  performed the HPI with the patient and updated documentation appropriately.          Comments    Referral of DR. Groat for retina evaluation(ARMD OU). Patient states" he had cataract sx (10/01/17) os, Dr. Katy Fitch felt he needed to have retina evaluation". Patient states his "vision is better than it use to be". Pt is on gtt's unsure name, he is taking fish oil QD.       Last edited by Bernarda Caffey, MD on 11/28/2017  1:30 PM. (History)    Pt states Dr. Elliot Dally did cataract sx and was unable to "tell much of a difference"; Pt states he was followed by Dr. Iona Hansen in [place], after Dr. Iona Hansen retired he was transferred to Dr. Elliot Dally;   Referring physician: Clent Jacks, MD [address] [place], [place] [address]  HISTORICAL INFORMATION:   Selected notes from the MEDICAL RECORD NUMBER Referred by Dr. Elliot Dally for concern of ARMD OS;  LEE- 03.20.19 (R. Groat) [BCVA OD: 20/25-2 OS: 20/40-2] Ocular Hx- pseudophakia OS 2.11.19 (R. Groat), cataract OD PMH- parkinsons, A-Fib    CURRENT MEDICATIONS: No current outpatient medications on file. (Ophthalmic Drugs)   No current facility-administered medications for this visit.  (Ophthalmic Drugs)   Current Outpatient Medications (Other)  Medication Sig  .  AMLODIPINE-ATORVASTATIN PO Take by mouth.  . Apixaban (ELIQUIS PO) Take by mouth.  Marland Kitchen atorvastatin (LIPITOR) 10 MG tablet Take 10 mg by mouth daily.  . Carbidopa 25 MG tablet Take 25 mg by mouth 3 (three) times daily.  . cholecalciferol (VITAMIN D) 1000 units tablet Take 1,000 Units by mouth daily.  Marland Kitchen docusate sodium (COLACE) 100 MG capsule Take 100 mg by mouth 2 (two) times daily.  Marland Kitchen doxylamine, Sleep, (UNISOM) 25 MG tablet Take 25 mg by mouth at bedtime as needed.  . fluticasone (FLONASE) 50 MCG/ACT nasal spray Place into both nostrils daily.  . Folic [id number] (FOLGARD PO) Take by mouth.  Marland Kitchen KRILL OIL PO Take by mouth.  . metoprolol tartrate (LOPRESSOR) 25 MG tablet Take 25 mg by mouth 2 (two) times daily.  . Multiple Vitamins-Minerals (PRESERVISION AREDS 2 PO) Take by mouth.  . rasagiline (AZILECT) 0.5 MG TABS tablet Take 0.5 mg by mouth daily.  Marland Kitchen terazosin (HYTRIN) 1 MG capsule Take 1 mg by mouth at bedtime.  . valsartan-hydrochlorothiazide (DIOVAN-HCT) 160-12.5 MG tablet Take 1 tablet by mouth daily.   No current facility-administered medications for this visit.  (Other)      REVIEW OF SYSTEMS: ROS    Positive for: Cardiovascular, Eyes   Negative for: Constitutional, Gastrointestinal, Neurological, Skin, Genitourinary, Musculoskeletal, HENT, Endocrine, Respiratory, Psychiatric, Allergic/Imm, Heme/Lymph   Last edited by Zenovia Jordan, LPN on 9/98/3382  5:05 PM. (History)       ALLERGIES Allergies  Allergen Reactions  .  Aspirin Nausea And Vomiting    PAST MEDICAL HISTORY Past Medical History:  Diagnosis Date  . Hyperlipemia   . Hypertension    Past Surgical History:  Procedure Laterality Date  . CATARACT EXTRACTION    . EYE SURGERY      FAMILY HISTORY Family History  Problem Relation Age of Onset  . Heart disease Father     SOCIAL HISTORY Social History   Tobacco Use  . Smoking status: Never Smoker  . Smokeless tobacco: Never Used   Substance Use Topics  . Alcohol use: Not Currently    Frequency: Never  . Drug use: Never         OPHTHALMIC EXAM:  Base Eye Exam    Visual Acuity (Snellen - Linear)      Right Left   Dist [place] 20/100 20/50 +1   Dist ph [place] 20/40 20/50 +1       Tonometry (Tonopen, 1:30 PM)      Right Left   Pressure 09 06       Pupils      Dark Light Shape React APD   Right 4 2 Round Brisk None   Left 3 2.5 Round Minimal None       Visual Fields (Counting fingers)      Left Right    Full Full       Extraocular Movement      Right Left    Full, Ortho Full, Ortho       Neuro/Psych    Oriented x3:  Yes   Mood/Affect:  Normal       Dilation    Both eyes:  1.0% Mydriacyl, 2.5% Phenylephrine @ 1:30 PM        Slit Lamp and Fundus Exam    External Exam      Right Left   External Brow ptosis Brow ptosis       Slit Lamp Exam      Right Left   Lids/Lashes Dermatochalasis - upper lid Dermatochalasis - upper lid   Conjunctiva/Sclera White and quiet White and quiet   Cornea Arcus, Inferior 2-3+ Punctate epithelial erosions Arcus, Well healed cataract wounds   Anterior Chamber Deep and quiet, narrow temporal angle Deep and quiet   Iris Round and dilated Round and dilated   Lens 2-3+ Nuclear sclerosis, 2-3+ Cortical cataract PC IOL in good position   Vitreous Vitreous syneresis, Posterior vitreous detachment, Weiss ring Vitreous syneresis, Posterior vitreous detachment, Weiss ring       Fundus Exam      Right Left   Disc Pink and Sharp Mild Inferior Peripapillary atrophy, Pink and Sharp   C/D Ratio 0.3 0.3   Macula Blunted foveal reflex, Retinal pigment epithelial mottling, almost confluent Drusen, No heme or edema Flat, RPE mottling and clumping centrally, almost confluent Drusen, No heme or edema   Vessels Mild Copper wiring Mild Vascular attenuation, mild AV crossing changes   Periphery Attached, scattered Reticular degeneration Attached, scattered Reticular degeneration         Refraction    Manifest Refraction (Auto)      Sphere Cylinder Axis Dist VA   Right -0.50 +0.50 178    Left -0.75 +0.25 075        Manifest Refraction #2      Sphere Cylinder Axis Dist VA   Right -3.00 +0.50 178 20/30-2   Left -1.25 +0.50 044 20/40-2          IMAGING AND PROCEDURES  Imaging and Procedures  for 11/28/17  OCT, Retina - OU - Both Eyes       Right Eye Quality was good. Central Foveal Thickness: 243. Progression has no prior data. Findings include normal foveal contour, no IRF, no SRF, retinal drusen , outer retinal atrophy.   Left Eye Quality was good. Central Foveal Thickness: 270. Progression has no prior data. Findings include normal foveal contour, no IRF, no SRF, retinal drusen , outer retinal atrophy, pigment epithelial detachment.   Notes *Images captured and stored on drive  Diagnosis / Impression:  Non-Exudative ARMD OU  Clinical management:  See below  Abbreviations: NFP - Normal foveal profile. CME - cystoid macular edema. PED - pigment epithelial detachment. IRF - intraretinal fluid. SRF - subretinal fluid. EZ - ellipsoid zone. ERM - epiretinal membrane. ORA - outer retinal atrophy. ORT - outer retinal tubulation. SRHM - subretinal hyper-reflective material                  ASSESSMENT/PLAN:    ICD-10-CM   1. Intermediate stage nonexudative age-related macular degeneration of both eyes H35.3132   2. Posterior vitreous detachment of both eyes H43.813   3. Retinal edema H35.81 OCT, Retina - OU - Both Eyes  4. Pseudophakia, left eye Z96.1   5. Combined forms of age-related cataract of right eye H25.811     1. Age related macular degeneration, non-exudative, both eyes  - The incidence, anatomy, and pathology of dry AMD, risk of progression, and the AREDS and AREDS 2 study including smoking risks discussed with patient.  - Recommend amsler grid monitoring  - f/u 3 months  2. PVD / vitreous syneresis  Discussed findings and  prognosis  No RT or RD on 360 peripheral exam  Reviewed s/s of RT/RD  Strict return precautions for any such RT/RD signs/symptoms  3. No retinal edema on exam or OCT  4. Pseudophakia OS  - s/p CE/IOL by Dr. Elliot Dally on 02.11.19  - beautiful surgery, doing well  - monitor   5. Combined form age-related cataract OD-  - The symptoms of cataract, surgical options, and treatments and risks were discussed with patient. - discussed diagnosis and progression - likely visually significant - discussed that visual improvement from cataract surgery may be limited by ARMD OD - okay from a retina standpoint to proceed with cataract surgery OD when pt and surgeon are ready    Ophthalmic Meds Ordered this visit:  No orders of the defined types were placed in this encounter.      Return in about 3 months (around 02/27/2018) for F/U Non-Exu AMD OU, Dilated exam, OCT.  There are no Patient Instructions on file for this visit.   Explained the diagnoses, plan, and follow up with the patient and they expressed understanding.  Patient expressed understanding of the importance of proper follow up care.   This document serves as a record of services personally performed by Gardiner Sleeper, MD, PhD. It was created on their behalf by Catha Brow, [place], a certified ophthalmic assistant. The creation of this record is the provider's dictation and/or activities during the visit.  Electronically signed by: Catha Brow, [place]  11/28/17 2:35 PM    Gardiner Sleeper, M.D., Ph.D. Diseases & Surgery of the Retina and Wood 11/28/17   I have reviewed the above documentation for accuracy and completeness, and I agree with the above. Gardiner Sleeper, M.D., Ph.D. 11/28/17 2:35 PM    Abbreviations: M myopia (nearsighted); A  astigmatism; H hyperopia (farsighted); P presbyopia; Mrx spectacle prescription;  CTL contact lenses; OD right eye; OS left eye; OU both eyes  XT  exotropia; ET esotropia; PEK punctate epithelial keratitis; PEE punctate epithelial erosions; DES dry eye syndrome; MGD meibomian gland dysfunction; ATs artificial tears; PFAT's preservative free artificial tears; Ross nuclear sclerotic cataract; PSC posterior subcapsular cataract; ERM epi-retinal membrane; PVD posterior vitreous detachment; RD retinal detachment; DM diabetes mellitus; DR diabetic retinopathy; NPDR non-proliferative diabetic retinopathy; PDR proliferative diabetic retinopathy; CSME clinically significant macular edema; DME diabetic macular edema; dbh dot blot hemorrhages; CWS cotton wool spot; POAG primary open angle glaucoma; C/D cup-to-disc ratio; HVF humphrey visual field; GVF goldmann visual field; OCT optical coherence tomography; IOP intraocular pressure; BRVO Branch retinal vein occlusion; CRVO central retinal vein occlusion; CRAO central retinal artery occlusion; BRAO branch retinal artery occlusion; RT retinal tear; SB scleral buckle; PPV pars plana vitrectomy; VH Vitreous hemorrhage; PRP panretinal laser photocoagulation; IVK intravitreal kenalog; VMT vitreomacular traction; MH Macular hole;  NVD neovascularization of the disc; NVE neovascularization elsewhere; AREDS age related eye disease study; ARMD age related macular degeneration; POAG primary open angle glaucoma; EBMD epithelial/anterior basement membrane dystrophy; ACIOL anterior chamber intraocular lens; IOL intraocular lens; PCIOL posterior chamber intraocular lens; Phaco/IOL phacoemulsification with intraocular lens placement; [place] photorefractive keratectomy; LASIK laser assisted in situ keratomileusis; HTN hypertension; DM diabetes mellitus; COPD chronic obstructive pulmonary disease

## 2017-11-28 ENCOUNTER — Ambulatory Visit (INDEPENDENT_AMBULATORY_CARE_PROVIDER_SITE_OTHER): Payer: Medicare Other | Admitting: Ophthalmology

## 2017-11-28 ENCOUNTER — Encounter (INDEPENDENT_AMBULATORY_CARE_PROVIDER_SITE_OTHER): Payer: Self-pay | Admitting: Ophthalmology

## 2017-11-28 DIAGNOSIS — H3581 Retinal edema: Secondary | ICD-10-CM | POA: Diagnosis not present

## 2017-11-28 DIAGNOSIS — H25811 Combined forms of age-related cataract, right eye: Secondary | ICD-10-CM

## 2017-11-28 DIAGNOSIS — H43813 Vitreous degeneration, bilateral: Secondary | ICD-10-CM

## 2017-11-28 DIAGNOSIS — H353132 Nonexudative age-related macular degeneration, bilateral, intermediate dry stage: Secondary | ICD-10-CM | POA: Diagnosis not present

## 2017-11-28 DIAGNOSIS — Z961 Presence of intraocular lens: Secondary | ICD-10-CM

## 2017-11-29 ENCOUNTER — Encounter: Payer: Self-pay | Admitting: Family Medicine

## 2017-12-06 ENCOUNTER — Encounter: Payer: Self-pay | Admitting: Family

## 2017-12-06 ENCOUNTER — Ambulatory Visit (INDEPENDENT_AMBULATORY_CARE_PROVIDER_SITE_OTHER): Payer: Medicare Other

## 2017-12-06 ENCOUNTER — Ambulatory Visit (INDEPENDENT_AMBULATORY_CARE_PROVIDER_SITE_OTHER): Payer: Medicare Other | Admitting: Family

## 2017-12-06 VITALS — BP 129/73 | HR 58 | Temp 97.4°F | Ht 68.25 in | Wt 164.6 lb

## 2017-12-06 DIAGNOSIS — S63693A Other sprain of left middle finger, initial encounter: Secondary | ICD-10-CM

## 2017-12-06 DIAGNOSIS — M79645 Pain in left finger(s): Secondary | ICD-10-CM | POA: Diagnosis not present

## 2017-12-06 DIAGNOSIS — M7989 Other specified soft tissue disorders: Secondary | ICD-10-CM | POA: Diagnosis not present

## 2017-12-06 DIAGNOSIS — M79642 Pain in left hand: Secondary | ICD-10-CM | POA: Diagnosis not present

## 2017-12-06 NOTE — Patient Instructions (Signed)
Finger Sprain A finger sprain happens when the bands of tissue (ligaments) that hold the finger bones together stretch too much or tear. Follow these instructions at home: If you have a splint:  Wear the splint as told by your doctor. Remove it only as told by your doctor.  Loosen the splint if your fingers tingle, get numb, or turn cold and blue.  Do not let your splint get wet if it is not waterproof.  Keep the splint clean. If you have a cast:  Do not stick anything inside the cast to scratch your skin.  Check the skin around the cast every day. Tell your doctor about any concerns.  You may put lotion on dry skin around the edges of the cast. Do not put lotion on the skin under the cast.  Do not let your cast get wet if it is not waterproof.  Keep the cast clean. Bathing  If your splint or cast is not waterproof, cover it with a watertight plastic bag when you take a bath or a shower.  Keep any bandages (dressings) dry until your doctor says they can be taken off. Managing pain, stiffness, and swelling  If directed, put ice on the injured area: ? Put ice in a plastic bag. ? Place a towel between your skin and the bag. ? Leave the ice on for 20 minutes, 2-3 times a day.  Move your fingers often to avoid stiffness and to lessen swelling.  Raise (elevate) the injured area above the level of your heart while you are sitting or lying down. General instructions  Do not put pressure on any part of the cast or splint until it is fully hardened. This may take many hours.  Take over-the-counter and prescription medicines only as told by your doctor.  Do not drive or use heavy machinery while taking prescription pain medicine.  Do exercises as told by your doctor or physical therapist.  Do not wear rings on your injured finger.  Keep all follow-up visits as told by your doctor. This is important. Contact a doctor if:  Your pain is not helped by medicine.  Your bruising  or swelling gets worse.  Your cast or splint is damaged.  Your finger is numb or blue.  Your finger feels colder than normal. This information is not intended to replace advice given to you by your health care provider. Make sure you discuss any questions you have with your health care provider. Document Released: 09/09/2010 Document Revised: 09/07/2015 Document Reviewed: 06/17/2015 Elsevier Interactive Patient Education  [employer].

## 2017-12-06 NOTE — Progress Notes (Signed)
   Subjective:    Patient ID: Keith Hill, male    DOB: 17-Sep-1931, 84 y.o.   MRN: [id number]  PT presents to the office today with left middle finger pain that started 10 days ago. He reports he woke up with his "fist balled up" and was unable to extend his middle finger. He has used a splint that helps with pain, but continues to have moderate pain with any movement.  Hand Pain   The incident occurred more than 1 week ago. There was no injury mechanism. The pain is present in the left fingers. The quality of the pain is described as aching. The pain does not radiate. The pain is at a severity of 10/10. The pain is moderate. The pain has been intermittent since the incident. Pertinent negatives include no numbness or tingling. The symptoms are aggravated by movement. He has tried rest and NSAIDs for the symptoms. The treatment provided mild relief.      Review of Systems  Neurological: Negative for tingling and numbness.  All other systems reviewed and are negative.      Objective:   Physical Exam  Constitutional: He is oriented to person, place, and time. He appears well-developed and well-nourished. No distress.  HENT:  Head: Normocephalic.  Eyes: Pupils are equal, round, and reactive to light. Right eye exhibits no discharge. Left eye exhibits no discharge.  Neck: Normal range of motion. Neck supple. No thyromegaly present.  Cardiovascular: Normal rate, regular rhythm, normal heart sounds and intact distal pulses.  No murmur heard. Pulmonary/Chest: Effort normal and breath sounds normal. No respiratory distress. He has no wheezes.  Abdominal: Soft. Bowel sounds are normal. He exhibits no distension. There is no tenderness.  Musculoskeletal: Normal range of motion. He exhibits tenderness. He exhibits no edema.  Left middle finger- tenderness and swelling in middle and proximal joint with movement  Neurological: He is alert and oriented to person, place, and time.  Skin: Skin is warm  and dry. No rash noted. No erythema.  Psychiatric: He has a normal mood and affect. His behavior is normal. Judgment and thought content normal.  Vitals reviewed.  X-ray- negative, Preliminary reading by Evelina Dun, FNP [facility]  BP 129/73   Pulse (!) 58   Temp (!) 97.4 F (36.3 C) (Oral)   Ht 5' 8.25" (1.734 m)   Wt 164 lb 9.6 oz (74.7 kg)   BMI 24.84 kg/m      Assessment & Plan:  1. Pain of left middle finger - DG Hand Complete Left; Future   2. Other sprain of left middle finger, initial encounter  Rest Ice  ROM exercises Buddy tape as needed RTO prn or if symptoms worsen or do not improve  Evelina Dun, FNP

## 2017-12-10 ENCOUNTER — Other Ambulatory Visit: Payer: Self-pay | Admitting: Family Medicine

## 2017-12-14 DIAGNOSIS — M79645 Pain in left finger(s): Secondary | ICD-10-CM | POA: Diagnosis not present

## 2017-12-14 DIAGNOSIS — M65332 Trigger finger, left middle finger: Secondary | ICD-10-CM | POA: Diagnosis not present

## 2017-12-18 ENCOUNTER — Ambulatory Visit: Payer: Medicare Other | Admitting: *Deleted

## 2017-12-19 ENCOUNTER — Encounter: Payer: Self-pay | Admitting: *Deleted

## 2017-12-19 ENCOUNTER — Ambulatory Visit (INDEPENDENT_AMBULATORY_CARE_PROVIDER_SITE_OTHER): Payer: Medicare Other | Admitting: *Deleted

## 2017-12-19 VITALS — BP 104/49 | HR 78 | Ht 66.5 in | Wt 163.0 lb

## 2017-12-19 DIAGNOSIS — Z0000 Encounter for general adult medical examination without abnormal findings: Secondary | ICD-10-CM

## 2017-12-19 DIAGNOSIS — N183 Chronic kidney disease, stage 3 unspecified: Secondary | ICD-10-CM | POA: Insufficient documentation

## 2017-12-19 DIAGNOSIS — I129 Hypertensive chronic kidney disease with stage 1 through stage 4 chronic kidney disease, or unspecified chronic kidney disease: Secondary | ICD-10-CM | POA: Insufficient documentation

## 2017-12-19 NOTE — Patient Instructions (Signed)
  Keith Hill , Thank you for taking time to come for your Medicare Wellness Visit. I appreciate your ongoing commitment to your health goals. Please review the following plan we discussed and let me know if I can assist you in the future.   These are the goals we discussed: Goals    . Exercise 150 min/wk Moderate Activity    . Weight (lb) < 155 lb (70.3 kg)       This is a list of the screening recommended for you and due dates:  Health Maintenance  Topic Date Due  . Flu Shot  03/21/2018  . Tetanus Vaccine  05/24/2021  . Pneumonia vaccines  Completed

## 2017-12-19 NOTE — Progress Notes (Addendum)
Subjective:   Keith Hill is a 84 y.o. male who presents for a subsequentl Medicare Annual Wellness Visit. Keith Hill comes in today with his wife. He is retired and lives at home with his wife of 7 years. They have one adult son and two grandchildren.   Review of Systems  Health is about the same as last year.   Cardio: Risk Factors-Advanced Age, hyperlipidemia, hypertension     Objective:    Today's Vitals   12/19/17 1417  BP: (!) 104/49  Pulse: 78  Weight: 163 lb (73.9 kg)  Height: 5' 6.5" (1.689 m)   Body mass index is 25.91 kg/m.  Advanced Directives 12/19/2017 01/29/2017 12/13/2016 09/23/2015 09/12/2015 03/23/2015 11/19/2014  Does Patient Have a Medical Advance Directive? Yes Yes - Yes Yes Yes Yes  Type of Advance Directive Living will;Healthcare Power of Attorney - Living will;Healthcare Power of [place];Living will Living will;Healthcare Power of [place];Living will Living will;Healthcare Power of Attorney  Does patient want to make changes to medical advance directive? No - Patient declined - (No Data) - - - No - Patient declined  Copy of [place] in Chart? No - copy requested - No - copy requested - - - Yes  Would patient like information on creating a medical advance directive? - - No - Patient declined - - - -    Current Medications (verified) Outpatient Encounter Medications as of 12/19/2017  Medication Sig  . amLODipine (NORVASC) 10 MG tablet TAKE (1/2) TABLET DAILY.  Marland Kitchen atorvastatin (LIPITOR) 40 MG tablet TAKE ONE TABLET AT BEDTIME (Patient taking differently: TAKE ONE TABLET AT BEDTIME (Sunday, Wednesday, Friday- 20mg ), other days-10mg )  . carbidopa-levodopa (SINEMET IR) 25-100 MG tablet TAKE 1&1/2 TABLETS IN THE MORNING AND 1 AT NOON AND EVENING  . cholecalciferol (VITAMIN D) 1000 units tablet Take 1,000 Units by mouth daily.  . Cholecalciferol (VITAMIN D3) 5000 UNITS CAPS Take 1 capsule by  mouth 4 (four) times a week. Reported on 09/14/2015  . docusate sodium (COLACE) 100 MG capsule Take 100 mg by mouth 2 (two) times daily. daily  . doxylamine, Sleep, (UNISOM) 25 MG tablet Take 12.5 mg by mouth at bedtime as needed.  Marland Kitchen ELIQUIS 5 MG TABS tablet TAKE (1) TABLET TWICE A DAY.  . fluticasone (FLONASE) 50 MCG/ACT nasal spray Place into both nostrils daily.  . Folic [id number] (FOLGARD PO) Take by mouth.  Marland Kitchen guaiFENesin (MUCINEX) 600 MG 12 hr tablet Take 600 mg by mouth 2 (two) times daily as needed.  [name] Oil 300 MG CAPS Take 500 mg by mouth daily. Reported on 09/14/2015  . Magnesium 250 MG TABS Take 2 tablets by mouth at bedtime as needed.   . metoprolol (LOPRESSOR) 50 MG tablet Take 50 mg by mouth daily.   . Multiple Vitamins-Minerals (PRESERVISION AREDS 2 PO) Take by mouth.  . Probiotic Product ([place]) Take by mouth 1 dose over 24 hours.    . rasagiline (AZILECT) 1 MG TABS tablet TAKE 1 TABLET DAILY  . terazosin (HYTRIN) 2 MG capsule TAKE (1) CAPSULE DAILY AT BEDTIME.  . valsartan-hydrochlorothiazide (DIOVAN-HCT) 320-25 MG tablet TAKE 1 TABLET ONCE A DAY  . VIRT-GARD 2.2-25-1 MG TABS TAKE 1 TABLET DAILY   No facility-administered encounter medications on file as of 12/19/2017.     Allergies (verified) Aspirin; Aspirin; Calicum glycerophosphate; Niaspan [niacin er]; and Zocor [simvastatin]   History: Past Medical History:  Diagnosis Date  .  Allergic rhinitis, cause unspecified   . Cardiac dysrhythmia, unspecified   . Colon polyps   . Essential and other specified forms of tremor   . GERD (gastroesophageal reflux disease)   . Hemorrhoids, external   . Hiatal hernia   . Hyperlipemia   . Hyperlipidemia   . Hyperplasia of prostate   . Hypertension   . Lumbar disc disease   . Pancreatitis 1976  . Paralysis agitans ([place]) 11/07/2012  . Parkinson disease ([place])   . Peyronie's disease   . Prostate cancer [facility])    prostate   Past Surgical  History:  Procedure Laterality Date  . CATARACT EXTRACTION    . EYE SURGERY    . HEMORRHOID SURGERY    . KNEE SURGERY Left   . PROSTATE BIOPSY  04/03/2013  . PROSTATE BIOPSY  11/01/2011  . SHOULDER SURGERY Left    Family History  Problem Relation Age of Onset  . Heart disease Father   . Kidney failure Father   . Alcoholism Brother   . Heart attack Brother   . Hyperlipidemia Son   . Cancer Neg Hx    Social History   Socioeconomic History  . Marital status: Married    Spouse name: [name]  . Number of children: 1  . Years of education: College  . Highest education level: Not on file  Occupational History  . Occupation: retired    Comment: Building control surveyor: RETIRED  . Occupation: retired  Scientific laboratory technician  . Financial resource strain: Not hard at all  . Food insecurity:    Worry: Never true    Inability: Never true  . Transportation needs:    Medical: No    Non-medical: No  Tobacco Use  . Smoking status: Never Smoker  . Smokeless tobacco: Never Used  Substance and Sexual Activity  . Alcohol use: Not Currently    Frequency: Never  . Drug use: Never  . Sexual activity: Yes  Lifestyle  . Physical activity:    Days per week: 0 days    Minutes per session: 0 min  . Stress: Not at all  Relationships  . Social connections:    Talks on phone: More than three times a week    Gets together: More than three times a week    Attends religious service: More than 4 times per year    Active member of club or organization: Yes    Attends meetings of clubs or organizations: More than 4 times per year    Relationship status: Married  Other Topics Concern  . Not on file  Social History Narrative   ** Merged History Encounter **       Patient lives at home with spouse. Caffeine Use: 2-3 sodas daily Patient is right handed.   Tobacco Counseling Counseling given: Not Answered  Activities of Daily Living In your present state of health, do you have any difficulty  performing the following activities: 12/19/2017  Hearing? Y  Comment has some mild hearing loss due to working in a [employer]? N  Difficulty concentrating or making decisions? N  Walking or climbing stairs? N  Dressing or bathing? N  Doing errands, shopping? N  Preparing Food and eating ? N  Using the Toilet? N  In the past six months, have you accidently leaked urine? N  Do you have problems with loss of bowel control? N  Managing your Medications? N  Managing your Finances? N  Housekeeping or managing  your Housekeeping? N  Some recent data might be hidden     Immunizations and Health Maintenance Immunization History  Administered Date(s) Administered  . Influenza Whole 04/21/2010  . Influenza, High Dose Seasonal PF 05/18/2016, 05/15/2017  . Influenza,inj,Quad PF,6+ Mos 05/29/2013, 06/01/2014, 06/01/2015  . Pneumococcal Conjugate-13 08/28/2013  . Pneumococcal Polysaccharide-23 07/21/2006  . Td 10/26/2008  . Tdap 05/25/2011  . Zoster 07/25/2006   There are no preventive care reminders to display for this patient.  Patient Care Team: Chipper Herb, MD as PCP - General (Family Medicine) Kathrynn Ducking, MD as Consulting Physician (Neurology) Clarene Essex, MD as Consulting Physician (Gastroenterology) Irine Seal, MD as Consulting Physician (Urology) Jacolyn Reedy, MD as Consulting Physician (Cardiology) Suella Broad, MD as Consulting Physician (Physical Medicine and Rehabilitation) Langley Gauss, MD as Referring Physician (Pain Medicine) Michel Harrow, [place] as Referring Physician (Chiropractic Medicine) Chipper Herb, MD (Family Medicine) Clent Jacks, MD as Consulting Physician (Ophthalmology)  No hospitalizations, ER visits, or surgeries this past year.      Assessment:   This is a routine wellness examination for [place].  Hearing/Vision screen No deficits noted during visit.   Dietary issues and exercise activities  discussed: Exercise limited by: orthopedic condition(s);neurologic condition(s)  Goals    . Exercise 150 min/wk Moderate Activity    . Weight (lb) < 155 lb (70.3 kg)      Depression Screen PHQ 2/9 Scores 12/19/2017 12/06/2017 10/29/2017 08/02/2017  PHQ - 2 Score 0 0 0 0    Fall Risk Fall Risk  12/19/2017 12/06/2017 10/29/2017 08/02/2017 05/15/2017  Falls in the past year? No No No No No  Comment - - - - -  Number falls in past yr: - - - - -  Injury with Fall? - - - - -  Follow up - - - - -    Is the patient's home free of loose throw rugs in walkways, pet beds, electrical cords, etc?   yes      Grab bars in the bathroom? yes      Handrails on the stairs?   yes      Adequate lighting?   yes   Cognitive Function: MMSE - Mini Mental State Exam 12/19/2017 12/13/2016 11/19/2014  Orientation to time 5 5 5   Orientation to Place 5 5 5   Registration 3 3 3   Attention/ Calculation 5 5 5   Recall 1 3 3   Language- name 2 objects 2 2 2   Language- repeat 1 1 1   Language- follow 3 step command 3 3 3   Language- read & follow direction 1 1 1   Write a sentence 1 1 1   Copy design 1 1 1   Total score 28 30 30         Screening Tests Health Maintenance  Topic Date Due  . INFLUENZA VACCINE  03/21/2018  . TETANUS/TDAP  05/24/2021  . PNA vac Low Risk Adult  Completed        Plan:   Keep f/u with PCP Increase activity level. Aim for at least 150 minutes a week.  Move carefully to avoid falls  I have personally reviewed and noted the following in the patient's chart:   . Medical and social history . Use of alcohol, tobacco or illicit drugs  . Current medications and supplements . Functional ability and status . Nutritional status . Physical activity . Advanced directives . List of other physicians . Hospitalizations, surgeries, and ER visits in previous 12 months . Vitals . Screenings  to include cognitive, depression, and falls . Referrals and appointments  In addition, I have  reviewed and discussed with patient certain preventive protocols, quality metrics, and best practice recommendations. A written personalized care plan for preventive services as well as general preventive health recommendations were provided to patient.     Ilean China, RN   12/19/2017   I have reviewed and agree with the above AWV documentation.   Arrie Senate MD

## 2017-12-24 ENCOUNTER — Other Ambulatory Visit: Payer: Medicare Other

## 2017-12-24 DIAGNOSIS — C61 Malignant neoplasm of prostate: Secondary | ICD-10-CM | POA: Diagnosis not present

## 2017-12-25 ENCOUNTER — Encounter: Payer: Self-pay | Admitting: *Deleted

## 2017-12-25 DIAGNOSIS — L821 Other seborrheic keratosis: Secondary | ICD-10-CM | POA: Diagnosis not present

## 2017-12-25 DIAGNOSIS — D485 Neoplasm of uncertain behavior of skin: Secondary | ICD-10-CM | POA: Diagnosis not present

## 2017-12-25 DIAGNOSIS — L819 Disorder of pigmentation, unspecified: Secondary | ICD-10-CM | POA: Diagnosis not present

## 2017-12-25 DIAGNOSIS — C44509 Unspecified malignant neoplasm of skin of other part of trunk: Secondary | ICD-10-CM | POA: Diagnosis not present

## 2017-12-25 LAB — PSA, TOTAL AND FREE
PSA, Free Pct: 17.7 %
PSA, Free: 0.85 ng/mL
Prostate Specific Ag, Serum: 4.8 ng/mL — ABNORMAL HIGH (ref 0.0–4.0)

## 2017-12-31 DIAGNOSIS — R351 Nocturia: Secondary | ICD-10-CM | POA: Diagnosis not present

## 2017-12-31 DIAGNOSIS — C61 Malignant neoplasm of prostate: Secondary | ICD-10-CM | POA: Diagnosis not present

## 2017-12-31 DIAGNOSIS — N403 Nodular prostate with lower urinary tract symptoms: Secondary | ICD-10-CM | POA: Diagnosis not present

## 2017-12-31 DIAGNOSIS — R972 Elevated prostate specific antigen [PSA]: Secondary | ICD-10-CM | POA: Diagnosis not present

## 2018-01-07 ENCOUNTER — Telehealth: Payer: Self-pay | Admitting: Family Medicine

## 2018-01-07 ENCOUNTER — Other Ambulatory Visit: Payer: Self-pay | Admitting: Family Medicine

## 2018-01-07 ENCOUNTER — Other Ambulatory Visit: Payer: Self-pay | Admitting: *Deleted

## 2018-01-07 MED ORDER — VALSARTAN-HYDROCHLOROTHIAZIDE 160-12.5 MG PO TABS: 1.0000 | ORAL_TABLET | Freq: Every day | ORAL | 2 refills | Status: DC

## 2018-01-07 MED ORDER — ATORVASTATIN CALCIUM 20 MG PO TABS: 20.0000 mg | ORAL_TABLET | Freq: Every day | ORAL | 1 refills | Status: DC

## 2018-01-07 MED ORDER — VALSARTAN-HYDROCHLOROTHIAZIDE 320-25 MG PO TABS: 1.0000 | ORAL_TABLET | Freq: Every day | ORAL | 1 refills | Status: DC

## 2018-01-07 NOTE — Telephone Encounter (Signed)
Talked to [facility]

## 2018-01-07 NOTE — Addendum Note (Signed)
Addended by: Antonietta Barcelona D on: 01/07/2018 02:58 PM   Modules accepted: Orders

## 2018-01-07 NOTE — Telephone Encounter (Signed)
Fax received [employer] 320-25 mg Note states pt request normal dose Valsartan-HCTZ 160-12.5 d/t his parkinson's & having to 1/2 them Please advise

## 2018-01-17 DIAGNOSIS — L988 Other specified disorders of the skin and subcutaneous tissue: Secondary | ICD-10-CM | POA: Diagnosis not present

## 2018-01-17 DIAGNOSIS — C44599 Other specified malignant neoplasm of skin of other part of trunk: Secondary | ICD-10-CM | POA: Diagnosis not present

## 2018-01-21 ENCOUNTER — Other Ambulatory Visit: Payer: Self-pay | Admitting: Family Medicine

## 2018-01-21 ENCOUNTER — Telehealth: Payer: Self-pay | Admitting: Family Medicine

## 2018-01-21 NOTE — Telephone Encounter (Signed)
Orthopedics has returned his call and all is taken care of.

## 2018-01-31 DIAGNOSIS — Z4802 Encounter for removal of sutures: Secondary | ICD-10-CM | POA: Diagnosis not present

## 2018-02-04 ENCOUNTER — Other Ambulatory Visit: Payer: Self-pay | Admitting: Family Medicine

## 2018-02-25 DIAGNOSIS — D235 Other benign neoplasm of skin of trunk: Secondary | ICD-10-CM | POA: Diagnosis not present

## 2018-02-26 DIAGNOSIS — Z7901 Long term (current) use of anticoagulants: Secondary | ICD-10-CM | POA: Diagnosis not present

## 2018-02-26 DIAGNOSIS — E785 Hyperlipidemia, unspecified: Secondary | ICD-10-CM | POA: Diagnosis not present

## 2018-02-26 DIAGNOSIS — I10 Essential (primary) hypertension: Secondary | ICD-10-CM | POA: Diagnosis not present

## 2018-02-26 DIAGNOSIS — G20 Parkinson's disease: Secondary | ICD-10-CM | POA: Diagnosis not present

## 2018-02-26 DIAGNOSIS — I482 Chronic atrial fibrillation: Secondary | ICD-10-CM | POA: Diagnosis not present

## 2018-02-26 DIAGNOSIS — I2510 Atherosclerotic heart disease of native coronary artery without angina pectoris: Secondary | ICD-10-CM | POA: Diagnosis not present

## 2018-02-26 NOTE — Progress Notes (Signed)
[facility] Note  02/27/2018     CHIEF COMPLAINT Patient presents for Retina Follow Up   HISTORY OF PRESENT ILLNESS: Keith Hill is a 84 y.o. male who presents to the clinic today for:   HPI    Retina Follow Up    Patient presents with  Dry AMD.  In both eyes.  This started 3 months ago.  Severity is mild.  Since onset it is stable.  I, the attending physician,  performed the HPI with the patient and updated documentation appropriately.          Comments    F/U ARMD OU. Patient states he has not had floaters for appx a month, he vision is a little better since last ov per patient. Pt is taking PreSerVision qd, uses [place] gtt's PRN       Last edited by Bernarda Caffey, MD on 02/27/2018  1:24 PM. (History)    Pt states Dr. Elliot Dally did cataract sx and was unable to "tell much of a difference"; Pt states he was followed by Dr. Iona Hansen in [place], after Dr. Iona Hansen retired he was transferred to Dr. Elliot Dally;   Referring physician: Chipper Herb, MD [place], [place] [address]  HISTORICAL INFORMATION:   Selected notes from the MEDICAL RECORD NUMBER Referred by Dr. Elliot Dally for concern of ARMD OS;  LEE- 03.20.19 (R. Groat) [BCVA OD: 20/25-2 OS: 20/40-2] Ocular Hx- pseudophakia OS 2.11.19 (R. Groat), cataract OD PMH- parkinsons, A-Fib    CURRENT MEDICATIONS: No current outpatient medications on file. (Ophthalmic Drugs)   No current facility-administered medications for this visit.  (Ophthalmic Drugs)   Current Outpatient Medications (Other)  Medication Sig  . amLODipine (NORVASC) 10 MG tablet TAKE (1/2) TABLET DAILY.  Marland Kitchen atorvastatin (LIPITOR) 20 MG tablet Take 1 tablet (20 mg total) by mouth daily. As directed  . carbidopa-levodopa (SINEMET IR) 25-100 MG tablet TAKE 1&1/2 TABLETS IN THE MORNING AND 1 AT NOON AND EVENING  . cholecalciferol (VITAMIN D) 1000 units tablet Take 1,000 Units by mouth daily.  . Cholecalciferol (VITAMIN D3) 5000  UNITS CAPS Take 1 capsule by mouth 4 (four) times a week. Reported on 09/14/2015  . docusate sodium (COLACE) 100 MG capsule Take 100 mg by mouth 2 (two) times daily. daily  . doxylamine, Sleep, (UNISOM) 25 MG tablet Take 12.5 mg by mouth at bedtime as needed.  Marland Kitchen ELIQUIS 5 MG TABS tablet TAKE (1) TABLET TWICE A DAY.  . fluticasone (FLONASE) 50 MCG/ACT nasal spray Place into both nostrils daily.  . Folic [id number] (FOLGARD PO) Take by mouth.  Marland Kitchen guaiFENesin (MUCINEX) 600 MG 12 hr tablet Take 600 mg by mouth 2 (two) times daily as needed.  [name] Oil 300 MG CAPS Take 500 mg by mouth daily. Reported on 09/14/2015  . Magnesium 250 MG TABS Take 2 tablets by mouth at bedtime as needed.   . metoprolol (LOPRESSOR) 50 MG tablet Take 50 mg by mouth daily.   . Multiple Vitamins-Minerals (PRESERVISION AREDS 2 PO) Take by mouth.  . Probiotic Product ([place]) Take by mouth 1 dose over 24 hours.    . rasagiline (AZILECT) 1 MG TABS tablet TAKE 1 TABLET DAILY  . terazosin (HYTRIN) 2 MG capsule TAKE (1) CAPSULE DAILY AT BEDTIME.  Marland Kitchen terazosin (HYTRIN) 2 MG capsule TAKE (1) CAPSULE DAILY AT BEDTIME.  . valsartan-hydrochlorothiazide (DIOVAN HCT) 160-12.5 MG tablet Take 1 tablet by mouth daily.  Marland Kitchen  valsartan-hydrochlorothiazide (DIOVAN-HCT) 320-25 MG tablet Take 1 tablet by mouth daily.  Marland Kitchen VIRT-GARD 2.2-25-1 MG TABS TAKE 1 TABLET DAILY   No current facility-administered medications for this visit.  (Other)      REVIEW OF SYSTEMS: ROS    Positive for: Eyes   Negative for: Constitutional, Gastrointestinal, Neurological, Skin, Genitourinary, Musculoskeletal, HENT, Endocrine, Cardiovascular, Respiratory, Psychiatric, Allergic/Imm, Heme/Lymph   Last edited by Zenovia Jordan, LPN on 3/79/0240  9:73 PM. (History)       ALLERGIES Allergies  Allergen Reactions  . Aspirin     Upsets stomach  . Aspirin Nausea And Vomiting  . Calicum Glycerophosphate   . Niaspan [Niacin Er]   .  Zocor [Simvastatin]     PAST MEDICAL HISTORY Past Medical History:  Diagnosis Date  . Allergic rhinitis, cause unspecified   . Cardiac dysrhythmia, unspecified   . Colon polyps   . Essential and other specified forms of tremor   . GERD (gastroesophageal reflux disease)   . Hemorrhoids, external   . Hiatal hernia   . Hyperlipemia   . Hyperlipidemia   . Hyperplasia of prostate   . Hypertension   . Lumbar disc disease   . Pancreatitis 1976  . Paralysis agitans ([place]) 11/07/2012  . Parkinson disease ([place])   . Peyronie's disease   . Prostate cancer [facility])    prostate   Past Surgical History:  Procedure Laterality Date  . CATARACT EXTRACTION    . EYE SURGERY    . HEMORRHOID SURGERY    . KNEE SURGERY Left   . PROSTATE BIOPSY  04/03/2013  . PROSTATE BIOPSY  11/01/2011  . SHOULDER SURGERY Left     FAMILY HISTORY Family History  Problem Relation Age of Onset  . Heart disease Father   . Kidney failure Father   . Alcoholism Brother   . Heart attack Brother   . Hyperlipidemia Son   . Cancer Neg Hx     SOCIAL HISTORY Social History   Tobacco Use  . Smoking status: Never Smoker  . Smokeless tobacco: Never Used  Substance Use Topics  . Alcohol use: Not Currently    Frequency: Never  . Drug use: Never         OPHTHALMIC EXAM:  Base Eye Exam    Visual Acuity (Snellen - Linear)      Right Left   Dist cc 20/40 -1 20/50   Dist ph cc 20/30 -1 NI   Correction:  Glasses       Tonometry (Tonopen, 1:16 PM)      Right Left   Pressure 13 14       Pupils      Dark Light Shape React APD   Right 4 3 Round Brisk None   Left 3 2 Round Brisk None       Visual Fields (Counting fingers)      Left Right    Full Full       Extraocular Movement      Right Left    Full, Ortho Full, Ortho       Neuro/Psych    Oriented x3:  Yes   Mood/Affect:  Normal       Dilation    Both eyes:  Phenylephrine, 1.0% Mydriacyl @ 1:16 PM        Slit Lamp and Fundus Exam     External Exam      Right Left   External Brow ptosis Brow ptosis       Slit Lamp Exam  Right Left   Lids/Lashes Dermatochalasis - upper lid Dermatochalasis - upper lid   Conjunctiva/Sclera White and quiet White and quiet   Cornea Arcus, 1+ diffuse Punctate epithelial erosions, Debris in tear film Arcus, Well healed cataract wounds, faint round IT sub-epi scar   Anterior Chamber Deep and quiet, narrow temporal angle Deep and quiet   Iris Round and dilated Round and dilated   Lens 2-3+ Nuclear sclerosis, 2-3+ Cortical cataract, Vacuoles PC IOL in good position   Vitreous Vitreous syneresis, Posterior vitreous detachment, Weiss ring Vitreous syneresis, Posterior vitreous detachment, Weiss ring       Fundus Exam      Right Left   Disc Pink and Sharp Mild Inferior Peripapillary atrophy, Pink and Sharp   C/D Ratio 0.3 0.4   Macula Blunted foveal reflex, Retinal pigment epithelial mottling and clumping, almost confluent Drusen, early RPE atrophy, No heme or edema Flat, RPE mottling and clumping and atrophy centrally, almost confluent Drusen, + PED, No heme or edema   Vessels Mild Copper wiring Mild Vascular attenuation, mild AV crossing changes   Periphery Attached, scattered Reticular degeneration Attached, scattered Reticular degeneration          IMAGING AND PROCEDURES  Imaging and Procedures for 11/28/17  OCT, Retina - OU - Both Eyes       Right Eye Quality was good. Central Foveal Thickness: 231. Progression has been stable. Findings include normal foveal contour, no IRF, no SRF, retinal drusen , outer retinal atrophy, pigment epithelial detachment.   Left Eye Quality was good. Central Foveal Thickness: 263. Progression has been stable. Findings include normal foveal contour, no IRF, no SRF, retinal drusen , outer retinal atrophy, pigment epithelial detachment.   Notes *Images captured and stored on drive  Diagnosis / Impression:  Non-Exudative ARMD OU  Clinical  management:  See below  Abbreviations: NFP - Normal foveal profile. CME - cystoid macular edema. PED - pigment epithelial detachment. IRF - intraretinal fluid. SRF - subretinal fluid. EZ - ellipsoid zone. ERM - epiretinal membrane. ORA - outer retinal atrophy. ORT - outer retinal tubulation. SRHM - subretinal hyper-reflective material                  ASSESSMENT/PLAN:    ICD-10-CM   1. Intermediate stage nonexudative age-related macular degeneration of both eyes H35.3132   2. Posterior vitreous detachment of both eyes H43.813   3. Retinal edema H35.81 OCT, Retina - OU - Both Eyes  4. Pseudophakia, left eye Z96.1   5. Combined forms of age-related cataract of right eye H25.811     1. Age related macular degeneration, non-exudative, both eyes  - The incidence, anatomy, and pathology of dry AMD, risk of progression, and the AREDS and AREDS 2 study including smoking risks discussed with patient.  - Recommend amsler grid monitoring  - OS with stable central PED with RPE atrophy and clumping -- likely reason OS is worse than OD  - f/u 3-4 months  2. PVD / vitreous syneresis  Discussed findings and prognosis  No RT or RD on 360 peripheral exam  Reviewed s/s of RT/RD  Strict return precautions for any such RT/RD signs/symptoms  3. No retinal edema on exam or OCT  4. Pseudophakia OS  - s/p CE/IOL OS by Dr. Elliot Dally on 02.11.19  - beautiful surgery, doing well  - monitor  5. Combined form age-related cataract OD-  - The symptoms of cataract, surgical options, and treatments and risks were discussed with patient. -  discussed diagnosis and progression - likely visually significant - discussed that visual improvement from cataract surgery may be limited by ARMD OD - okay from a retina standpoint to proceed with cataract surgery OD when pt and surgeon are ready    Ophthalmic Meds Ordered this visit:  No orders of the defined types were placed in this encounter.       Return in about 4 months (around 06/30/2018) for F/U Non-Exu AMD OU, DFE, OCT.  There are no Patient Instructions on file for this visit.   Explained the diagnoses, plan, and follow up with the patient and they expressed understanding.  Patient expressed understanding of the importance of proper follow up care.   This document serves as a record of services personally performed by Gardiner Sleeper, MD, PhD. It was created on their behalf by Ernest Mallick, OA, an ophthalmic assistant. The creation of this record is the provider's dictation and/or activities during the visit.    Electronically signed by: Ernest Mallick, OA  07.09.2019 11:28 PM     Gardiner Sleeper, M.D., Ph.D. Diseases & Surgery of the Retina and Vitreous [facility]   I have reviewed the above documentation for accuracy and completeness, and I agree with the above. Gardiner Sleeper, M.D., Ph.D. 02/27/18 11:30 PM    Abbreviations: M myopia (nearsighted); A astigmatism; H hyperopia (farsighted); P presbyopia; Mrx spectacle prescription;  CTL contact lenses; OD right eye; OS left eye; OU both eyes  XT exotropia; ET esotropia; PEK punctate epithelial keratitis; PEE punctate epithelial erosions; DES dry eye syndrome; MGD meibomian gland dysfunction; ATs artificial tears; PFAT's preservative free artificial tears; [place] nuclear sclerotic cataract; PSC posterior subcapsular cataract; ERM epi-retinal membrane; PVD posterior vitreous detachment; RD retinal detachment; DM diabetes mellitus; DR diabetic retinopathy; NPDR non-proliferative diabetic retinopathy; PDR proliferative diabetic retinopathy; CSME clinically significant macular edema; DME diabetic macular edema; dbh dot blot hemorrhages; CWS cotton wool spot; POAG primary open angle glaucoma; C/D cup-to-disc ratio; HVF humphrey visual field; GVF goldmann visual field; OCT optical coherence tomography; IOP intraocular pressure; BRVO Branch retinal vein occlusion;  CRVO central retinal vein occlusion; CRAO central retinal artery occlusion; BRAO branch retinal artery occlusion; RT retinal tear; SB scleral buckle; PPV pars plana vitrectomy; VH Vitreous hemorrhage; PRP panretinal laser photocoagulation; IVK intravitreal kenalog; VMT vitreomacular traction; MH Macular hole;  NVD neovascularization of the disc; NVE neovascularization elsewhere; AREDS age related eye disease study; ARMD age related macular degeneration; POAG primary open angle glaucoma; EBMD epithelial/anterior basement membrane dystrophy; ACIOL anterior chamber intraocular lens; IOL intraocular lens; PCIOL posterior chamber intraocular lens; Phaco/IOL phacoemulsification with intraocular lens placement; [place] photorefractive keratectomy; LASIK laser assisted in situ keratomileusis; HTN hypertension; DM diabetes mellitus; COPD chronic obstructive pulmonary disease

## 2018-02-27 ENCOUNTER — Ambulatory Visit (INDEPENDENT_AMBULATORY_CARE_PROVIDER_SITE_OTHER): Payer: Medicare Other | Admitting: Ophthalmology

## 2018-02-27 ENCOUNTER — Ambulatory Visit: Payer: Medicare Other | Admitting: Neurology

## 2018-02-27 ENCOUNTER — Encounter (INDEPENDENT_AMBULATORY_CARE_PROVIDER_SITE_OTHER): Payer: Self-pay | Admitting: Ophthalmology

## 2018-02-27 DIAGNOSIS — H43813 Vitreous degeneration, bilateral: Secondary | ICD-10-CM | POA: Diagnosis not present

## 2018-02-27 DIAGNOSIS — Z961 Presence of intraocular lens: Secondary | ICD-10-CM

## 2018-02-27 DIAGNOSIS — H3581 Retinal edema: Secondary | ICD-10-CM | POA: Diagnosis not present

## 2018-02-27 DIAGNOSIS — H353132 Nonexudative age-related macular degeneration, bilateral, intermediate dry stage: Secondary | ICD-10-CM

## 2018-02-27 DIAGNOSIS — H25811 Combined forms of age-related cataract, right eye: Secondary | ICD-10-CM

## 2018-03-11 ENCOUNTER — Telehealth: Payer: Self-pay | Admitting: Family Medicine

## 2018-03-11 DIAGNOSIS — M545 Low back pain: Secondary | ICD-10-CM | POA: Diagnosis not present

## 2018-03-11 DIAGNOSIS — G8929 Other chronic pain: Secondary | ICD-10-CM | POA: Diagnosis not present

## 2018-03-11 NOTE — Telephone Encounter (Signed)
Spoke with pt and informed him that he would need to contact [facility] to get MRI images but I would put the reports up  Front for p/u, which has been done.

## 2018-03-13 ENCOUNTER — Encounter: Payer: Self-pay | Admitting: Family Medicine

## 2018-03-13 ENCOUNTER — Ambulatory Visit (INDEPENDENT_AMBULATORY_CARE_PROVIDER_SITE_OTHER): Payer: Medicare Other | Admitting: Family Medicine

## 2018-03-13 VITALS — BP 120/70 | HR 72 | Temp 97.5°F | Ht 66.5 in | Wt 165.2 lb

## 2018-03-13 DIAGNOSIS — J029 Acute pharyngitis, unspecified: Secondary | ICD-10-CM | POA: Diagnosis not present

## 2018-03-13 MED ORDER — MAGIC MOUTHWASH: 15.0000 mL | Freq: Four times a day (QID) | ORAL | 0 refills | Status: DC | PRN

## 2018-03-13 NOTE — Progress Notes (Signed)
   HPI  Patient presents today with sore throat.  Patient explains that the night before last he took one half of the [place] and states that it got caught in his throat.  Patient states that he feels like it came out yesterday and he ate and drink without any problems without any serious symptoms.  Last night he began to develop sore throat, this morning its persistent.  Patient states that he is able to tolerate food and fluids by mouth, and is uncomfortable in his throat is still sore.  He has not tried any medications. He does not feel that the pill is still present   PMH: Smoking status noted ROS: Per HPI  Objective: BP 120/70   Pulse 72   Temp (!) 97.5 F (36.4 C) (Oral)   Ht 5' 6.5" (1.689 m)   Wt 165 lb 3.2 oz (74.9 kg)   BMI 26.26 kg/m  Gen: NAD, alert, cooperative with exam HEENT: NCAT, oropharynx moist and clear CV: RRR, good S1/S2, no murmur Resp: CTABL, no wheezes, non-labored Ext: No edema, warm Neuro: Alert and oriented, No gross deficits  Assessment and plan:  #Sore throat Patient with sore throat after a pill was lodged in his oropharynx for prolonged time Likely localized irritation Magic mouthwash, low threshold for follow-up   Meds ordered this encounter  Medications  . magic mouthwash SOLN    Sig: Take 15 mLs by mouth 4 (four) times daily as needed for mouth pain.    Dispense:  450 mL    Refill:  [place], MD [facility] 03/13/2018, 10:00 AM

## 2018-03-13 NOTE — Patient Instructions (Signed)
Great to see you!  Come back or call with any concerns   

## 2018-03-18 ENCOUNTER — Ambulatory Visit: Payer: Medicare Other | Admitting: Family Medicine

## 2018-03-21 ENCOUNTER — Other Ambulatory Visit: Payer: Self-pay

## 2018-03-21 ENCOUNTER — Encounter: Payer: Self-pay | Admitting: Neurology

## 2018-03-21 ENCOUNTER — Ambulatory Visit (INDEPENDENT_AMBULATORY_CARE_PROVIDER_SITE_OTHER): Payer: Medicare Other | Admitting: Neurology

## 2018-03-21 VITALS — BP 98/58 | HR 76 | Ht 66.5 in | Wt 163.5 lb

## 2018-03-21 DIAGNOSIS — M545 Low back pain: Secondary | ICD-10-CM

## 2018-03-21 DIAGNOSIS — G20 Parkinson's disease: Secondary | ICD-10-CM

## 2018-03-21 DIAGNOSIS — G8929 Other chronic pain: Secondary | ICD-10-CM

## 2018-03-21 MED ORDER — CARBIDOPA-LEVODOPA 25-100 MG PO TABS: ORAL_TABLET | ORAL | 3 refills | Status: DC

## 2018-03-21 NOTE — Progress Notes (Signed)
Reason for visit: Parkinson's disease  Keith Hill is an 84 y.o. male  History of present illness:  Keith Hill is an 84 year old right-handed white male with a history of Parkinson's disease.  The patient has a right sided tremor, he is limited mainly by his low back pain.  He does have a stooped posture.  He has gotten back into playing golf, he works out 3 times a week and sees a Restaurant manager, fast food.  The patient has not had any falls, he denies issues with swallowing, he sleeps fairly well at night.  He does have increased back pain with standing.  He is on very low-dose Sinemet, he also takes Azilect.  He tolerates these well.  He returns for an evaluation.  Past Medical History:  Diagnosis Date  . Allergic rhinitis, cause unspecified   . Cardiac dysrhythmia, unspecified   . Colon polyps   . Essential and other specified forms of tremor   . GERD (gastroesophageal reflux disease)   . Hemorrhoids, external   . Hiatal hernia   . Hyperlipemia   . Hyperlipidemia   . Hyperplasia of prostate   . Hypertension   . Lumbar disc disease   . Pancreatitis 1976  . Paralysis agitans ([place]) 11/07/2012  . Parkinson disease ([place])   . Peyronie's disease   . Prostate cancer [facility])    prostate    Past Surgical History:  Procedure Laterality Date  . CATARACT EXTRACTION    . EYE SURGERY    . HEMORRHOID SURGERY    . KNEE SURGERY Left   . PROSTATE BIOPSY  04/03/2013  . PROSTATE BIOPSY  11/01/2011  . SHOULDER SURGERY Left     Family History  Problem Relation Age of Onset  . Heart disease Father   . Kidney failure Father   . Alcoholism Brother   . Heart attack Brother   . Hyperlipidemia Son   . Cancer Neg Hx     Social history:  reports that he has never smoked. He has never used smokeless tobacco. He reports that he drank alcohol. He reports that he does not use drugs.    Allergies  Allergen Reactions  . Aspirin     Upsets stomach  . Aspirin Nausea And Vomiting  . Calicum  Glycerophosphate   . Niaspan [Niacin Er]   . Zocor [Simvastatin]     Medications:  Prior to Admission medications   Medication Sig Start Date End Date Taking? Authorizing Provider  amLODipine (NORVASC) 10 MG tablet TAKE (1/2) TABLET DAILY. 01/21/18  Yes Chipper Herb, MD  atorvastatin (LIPITOR) 20 MG tablet Take 1 tablet (20 mg total) by mouth daily. As directed 01/07/18  Yes Chipper Herb, MD  carbidopa-levodopa (SINEMET IR) 25-100 MG tablet TAKE 1&1/2 TABLETS IN THE MORNING AND 1 AT [facility] AND EVENING 03/21/18  Yes Kathrynn Ducking, MD  cholecalciferol (VITAMIN D) 1000 units tablet Take 1,000 Units by mouth daily.   Yes [provider]  Cholecalciferol (VITAMIN D3) 5000 UNITS CAPS Take 1 capsule by mouth 4 (four) times a week. Reported on 09/14/2015   Yes [provider]  docusate sodium (COLACE) 100 MG capsule Take 100 mg by mouth 2 (two) times daily. daily   Yes [provider]  doxylamine, Sleep, (UNISOM) 25 MG tablet Take 12.5 mg by mouth at bedtime as needed.   Yes [provider]  ELIQUIS 5 MG TABS tablet TAKE (1) TABLET TWICE A DAY. 12/10/17  Yes Chipper Herb, MD  fluticasone (FLONASE) 50 MCG/ACT nasal spray Place into both nostrils daily.   Yes [provider]  Folic [id number] (FOLGARD PO) Take by mouth.   Yes [provider]  guaiFENesin (MUCINEX) 600 MG 12 hr tablet Take 600 mg by mouth 2 (two) times daily as needed.   Yes [provider]  [name] Oil 300 MG CAPS Take 500 mg by mouth daily. Reported on 09/14/2015   Yes [provider]  magic mouthwash SOLN Take 15 mLs by mouth 4 (four) times daily as needed for mouth pain. 03/13/18  Yes Timmothy Euler, MD  Magnesium 250 MG TABS Take 2 tablets by mouth at bedtime as needed.    Yes [provider]  metoprolol (LOPRESSOR) 50 MG tablet Take 50 mg by mouth daily.    Yes [provider]  Multiple Vitamins-Minerals (PRESERVISION AREDS 2  PO) Take by mouth.   Yes [provider]  Probiotic Product ([place]) Take by mouth 1 dose over 24 hours.     Yes [provider]  rasagiline (AZILECT) 1 MG TABS tablet TAKE 1 TABLET DAILY 11/05/17  Yes Kathrynn Ducking, MD  terazosin (HYTRIN) 2 MG capsule TAKE (1) CAPSULE DAILY AT BEDTIME. 04/03/17  Yes Chipper Herb, MD  terazosin (HYTRIN) 2 MG capsule TAKE (1) CAPSULE DAILY AT BEDTIME. 01/07/18  Yes Chipper Herb, MD  valsartan-hydrochlorothiazide (DIOVAN HCT) 160-12.5 MG tablet Take 1 tablet by mouth daily. 01/07/18  Yes Chipper Herb, MD  valsartan-hydrochlorothiazide (DIOVAN-HCT) 320-25 MG tablet Take 1 tablet by mouth daily. 01/07/18  Yes Chipper Herb, MD  VIRT-GARD 2.2-25-1 MG TABS TAKE 1 TABLET DAILY 02/04/18  Yes Chipper Herb, MD    ROS:  Out of a complete 14 system review of symptoms, the patient complains only of the following symptoms, and all other reviewed systems are negative.  Low back pain  Blood pressure (!) 98/58, pulse 76, height 5' 6.5" (1.689 m), weight 163 lb 8 oz (74.2 kg), SpO2 98 %.  Physical Exam  General: The patient is alert and cooperative at the time of the examination.  Skin: No significant peripheral edema is noted.   Neurologic Exam  Mental status: The patient is alert and oriented x 3 at the time of the examination. The patient has apparent normal recent and remote memory, with an apparently normal attention span and concentration ability.   Cranial nerves: Facial symmetry is present. Speech is normal, no aphasia or dysarthria is noted. Extraocular movements are full. Visual fields are full.  Motor: The patient has good strength in all 4 extremities.  Sensory examination: Soft touch sensation is symmetric on the face, arms, and legs.  Coordination: The patient has good finger-nose-finger and heel-to-shin bilaterally.  Resting tremors are noted on the right upper extremity.  Gait and station: The  patient has the ability to rise from seated position with arms crossed.  Once up, the patient walks independently, he has fairly good arm swing, he has good turns.  Posture is stooped.  The tandem gait was slightly unsteady.  Romberg is negative.  Reflexes: Deep tendon reflexes are symmetric.   Assessment/Plan:  1.  Parkinson's disease  2.  Chronic low back pain  The patient is doing quite well with his Parkinson's disease, he is progressing very slowly.  His main issue is his chronic low back pain that limits his physical abilities.  The patient will continue on his current dose of Sinemet, a prescription was sent  in.  He will remain on Azilect.  He will follow-up in 6 months.  Jill Alexanders MD 03/21/2018 9:48 AM  [facility] [address] [place], [place] [address]  Phone [phone] Fax [phone]

## 2018-03-25 ENCOUNTER — Other Ambulatory Visit: Payer: Medicare Other

## 2018-03-25 ENCOUNTER — Other Ambulatory Visit: Payer: Self-pay | Admitting: Family Medicine

## 2018-03-25 DIAGNOSIS — E559 Vitamin D deficiency, unspecified: Secondary | ICD-10-CM | POA: Diagnosis not present

## 2018-03-25 DIAGNOSIS — E7800 Pure hypercholesterolemia, unspecified: Secondary | ICD-10-CM | POA: Diagnosis not present

## 2018-03-25 DIAGNOSIS — I10 Essential (primary) hypertension: Secondary | ICD-10-CM | POA: Diagnosis not present

## 2018-03-25 DIAGNOSIS — I4891 Unspecified atrial fibrillation: Secondary | ICD-10-CM | POA: Diagnosis not present

## 2018-03-26 LAB — HEPATIC FUNCTION PANEL
ALBUMIN: 4.4 g/dL (ref 3.5–4.7)
ALK PHOS: 107 IU/L (ref 39–117)
ALT: 29 IU/L (ref 0–44)
AST: 32 IU/L (ref 0–40)
BILIRUBIN, DIRECT: 0.29 mg/dL (ref 0.00–0.40)
Bilirubin Total: 0.9 mg/dL (ref 0.0–1.2)
TOTAL PROTEIN: 7.4 g/dL (ref 6.0–8.5)

## 2018-03-26 LAB — CBC WITH DIFFERENTIAL/PLATELET
Basophils Absolute: 0.1 10*3/uL (ref 0.0–0.2)
Basos: 1 %
EOS (ABSOLUTE): 0.5 10*3/uL — ABNORMAL HIGH (ref 0.0–0.4)
EOS: 6 %
HEMATOCRIT: 39 % (ref 37.5–51.0)
HEMOGLOBIN: 12.9 g/dL — AB (ref 13.0–17.7)
IMMATURE GRANULOCYTES: 0 %
Immature Grans (Abs): 0 10*3/uL (ref 0.0–0.1)
LYMPHS ABS: 1.9 10*3/uL (ref 0.7–3.1)
Lymphs: 23 %
MCH: 34.2 pg — ABNORMAL HIGH (ref 26.6–33.0)
MCHC: 33.1 g/dL (ref 31.5–35.7)
MCV: 103 fL — ABNORMAL HIGH (ref 79–97)
MONOCYTES: 11 %
Monocytes Absolute: 0.9 10*3/uL (ref 0.1–0.9)
Neutrophils Absolute: 5 10*3/uL (ref 1.4–7.0)
Neutrophils: 59 %
Platelets: 232 10*3/uL (ref 150–450)
RBC: 3.77 x10E6/uL — ABNORMAL LOW (ref 4.14–5.80)
RDW: 13.5 % (ref 12.3–15.4)
WBC: 8.3 10*3/uL (ref 3.4–10.8)

## 2018-03-26 LAB — LIPID PANEL
CHOL/HDL RATIO: 2.4 ratio (ref 0.0–5.0)
Cholesterol, Total: 117 mg/dL (ref 100–199)
HDL: 49 mg/dL (ref >39)
LDL CALC: 56 mg/dL (ref 0–99)
Triglycerides: 62 mg/dL (ref 0–149)
VLDL CHOLESTEROL CAL: 12 mg/dL (ref 5–40)

## 2018-03-26 LAB — BMP8+EGFR
BUN / CREAT RATIO: 18 (ref 10–24)
BUN: 27 mg/dL (ref 8–27)
CO2: 23 mmol/L (ref 20–29)
CREATININE: 1.52 mg/dL — AB (ref 0.76–1.27)
Calcium: 9.4 mg/dL (ref 8.6–10.2)
Chloride: 102 mmol/L (ref 96–106)
GFR calc Af Amer: 48 mL/min/{1.73_m2} — ABNORMAL LOW (ref >59)
GFR calc non Af Amer: 41 mL/min/{1.73_m2} — ABNORMAL LOW (ref >59)
GLUCOSE: 98 mg/dL (ref 65–99)
Potassium: 4.7 mmol/L (ref 3.5–5.2)
Sodium: 143 mmol/L (ref 134–144)

## 2018-03-26 LAB — VITAMIN D 25 HYDROXY (VIT D DEFICIENCY, FRACTURES): Vit D, 25-Hydroxy: 45.5 ng/mL (ref 30.0–100.0)

## 2018-03-29 ENCOUNTER — Encounter: Payer: Self-pay | Admitting: Family Medicine

## 2018-03-29 ENCOUNTER — Ambulatory Visit (INDEPENDENT_AMBULATORY_CARE_PROVIDER_SITE_OTHER): Payer: Medicare Other | Admitting: Family Medicine

## 2018-03-29 VITALS — BP 118/60 | HR 59 | Temp 97.3°F | Ht 66.5 in | Wt 160.0 lb

## 2018-03-29 DIAGNOSIS — G20 Parkinson's disease: Secondary | ICD-10-CM | POA: Diagnosis not present

## 2018-03-29 DIAGNOSIS — E7800 Pure hypercholesterolemia, unspecified: Secondary | ICD-10-CM | POA: Diagnosis not present

## 2018-03-29 DIAGNOSIS — I129 Hypertensive chronic kidney disease with stage 1 through stage 4 chronic kidney disease, or unspecified chronic kidney disease: Secondary | ICD-10-CM | POA: Diagnosis not present

## 2018-03-29 DIAGNOSIS — C61 Malignant neoplasm of prostate: Secondary | ICD-10-CM

## 2018-03-29 DIAGNOSIS — N183 Chronic kidney disease, stage 3 unspecified: Secondary | ICD-10-CM

## 2018-03-29 DIAGNOSIS — E559 Vitamin D deficiency, unspecified: Secondary | ICD-10-CM | POA: Diagnosis not present

## 2018-03-29 DIAGNOSIS — J301 Allergic rhinitis due to pollen: Secondary | ICD-10-CM

## 2018-03-29 DIAGNOSIS — I10 Essential (primary) hypertension: Secondary | ICD-10-CM | POA: Diagnosis not present

## 2018-03-29 DIAGNOSIS — G20A1 Parkinson's disease without dyskinesia, without mention of fluctuations: Secondary | ICD-10-CM

## 2018-03-29 DIAGNOSIS — I4891 Unspecified atrial fibrillation: Secondary | ICD-10-CM

## 2018-03-29 NOTE — Progress Notes (Signed)
Subjective:    Patient ID: Keith Hill, male    DOB: Aug 04, 1932, 84 y.o.   MRN: [id number]  HPI Pt here for follow up and management of chronic medical problems which includes hypertension and hyperlipidemia. He is taking medication regularly.  The patient is pleasant and doing well and continues to have ongoing back pain.  He and his wife have been very control by this chronic back pain his wife is worse than the patient is.  He does continue to get physical therapy.  There is a multiple specialist in no one is been able to really helped him very much with the ongoing back pain.  The patient has had lab work done and all of his cholesterol numbers with traditional lipid testing are excellent.  He does see Dr. Wynonia Lawman regularly his cardiologist.  The creatinine this time however was more elevated than previously and interestingly enough so was his wife's.  Blood sugar was good.  The creatinine was 1.52.  All of the electrolytes including potassium are good.  The white blood cell count was normal and hemoglobin is slightly decreased compared to previous readings and we will continue to monitor this and make sure that he is not taking any anti-inflammatory medicines.  The platelet count was adequate.  The vitamin D level was good at 45.5 and he will continue with current treatment.  All liver function tests were good.  The patient is on Eliquis Sinemet for his Parkinson's atorvastatin for his cholesterol along with taking Krill oil.  The patient denies any chest pain pressure or tightness.  He just saw Dr. Wynonia Lawman his cardiologist read recently and will see him again in about a year.  He denies any increased shortness of breath other than when he is climbing a slight heel but no pressure or tightness otherwise.  He denies any trouble with nausea vomiting blood in the stool or black tarry bowel movements.  He has had some loose bowel movements and he attributes this possibly to the Eliquis.  Also with his  hemoglobin being a little bit lower this time we want to follow-up and get a repeat CBC and a repeat BMP because his creatinine has increased in about 4 weeks and make sure that he returns an FOBT.  He is passing his water well but just has some frequency.  He also sees the urologist, Dr. Jeffie Pollock regularly.  His back pain is worse with walking and standing when he is doing exercise at the rehab center next-door he does not have a lot of back pain.     Patient Active Problem List   Diagnosis Date Noted  . Hypertension 12/19/2017  . 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 Aortic atherosclerosis ([place]) 10/29/2017  . Spondylosis without myelopathy or radiculopathy, lumbar region 12/05/2016  . Back pain 11/18/2013  . Vitamin D deficiency 08/28/2013  . Hiatal hernia with gastroesophageal reflux 08/28/2013  . CAD (coronary artery disease) 04/08/2013  . Essential and other specified forms of tremor 03/31/2013  . Parkinson disease ([place]) 11/07/2012  . Hemorrhoids 02/17/2011  . Hyperlipidemia with target LDL less than 70   . Prostate cancer ([place])   . Hypertension   . Allergic rhinitis   . Colon polyps, history of    Outpatient Encounter Medications as of 03/29/2018  Medication Sig  . amLODipine (NORVASC) 10 MG tablet TAKE (1/2) TABLET DAILY.  Marland Kitchen atorvastatin (LIPITOR) 20 MG tablet Take 1 tablet (20  mg total) by mouth daily. As directed  . carbidopa-levodopa (SINEMET IR) 25-100 MG tablet TAKE 1&1/2 TABLETS IN THE MORNING AND 1 AT NOON AND EVENING  . Cholecalciferol (VITAMIN D3) 5000 UNITS CAPS Take 1 capsule by mouth 4 (four) times a week. Reported on 09/14/2015  . docusate sodium (COLACE) 100 MG capsule Take 100 mg by mouth 2 (two) times daily. daily  . doxylamine, Sleep, (UNISOM) 25 MG tablet Take 12.5 mg by mouth at bedtime as needed.  Marland Kitchen ELIQUIS 5 MG TABS tablet TAKE (1) TABLET TWICE A DAY.  . fluticasone (FLONASE) 50 MCG/ACT nasal  spray Place into both nostrils daily.  . Folic [id number] (FOLGARD PO) Take by mouth.  Marland Kitchen guaiFENesin (MUCINEX) 600 MG 12 hr tablet Take 600 mg by mouth 2 (two) times daily as needed.  [name] Oil 300 MG CAPS Take 500 mg by mouth daily. Reported on 09/14/2015  . magic mouthwash SOLN Take 15 mLs by mouth 4 (four) times daily as needed for mouth pain.  . Magnesium 250 MG TABS Take 2 tablets by mouth at bedtime as needed.   . metoprolol (LOPRESSOR) 50 MG tablet Take 50 mg by mouth daily.   . Multiple Vitamins-Minerals (PRESERVISION AREDS 2 PO) Take by mouth.  . Probiotic Product ([place]) Take by mouth 1 dose over 24 hours.    . rasagiline (AZILECT) 1 MG TABS tablet TAKE 1 TABLET DAILY  . terazosin (HYTRIN) 2 MG capsule TAKE (1) CAPSULE DAILY AT BEDTIME.  . valsartan-hydrochlorothiazide (DIOVAN HCT) 160-12.5 MG tablet Take 1 tablet by mouth daily.  Marland Kitchen VIRT-GARD 2.2-25-1 MG TABS TAKE 1 TABLET DAILY  . [DISCONTINUED] cholecalciferol (VITAMIN D) 1000 units tablet Take 1,000 Units by mouth daily.  . [DISCONTINUED] terazosin (HYTRIN) 2 MG capsule TAKE (1) CAPSULE DAILY AT BEDTIME.  . [DISCONTINUED] valsartan-hydrochlorothiazide (DIOVAN-HCT) 320-25 MG tablet Take 1 tablet by mouth daily.   No facility-administered encounter medications on file as of 03/29/2018.      Review of Systems  Constitutional: Negative.   HENT: Negative.   Eyes: Negative.   Respiratory: Negative.   Cardiovascular: Negative.   Gastrointestinal: Negative.   Endocrine: Negative.   Genitourinary: Negative.   Musculoskeletal: Positive for back pain (on-going).  Skin: Negative.   Allergic/Immunologic: Negative.   Neurological: Negative.   Hematological: Negative.   Psychiatric/Behavioral: Negative.        Objective:   Physical Exam  Constitutional: He is oriented to person, place, and time. He appears well-developed and well-nourished. No distress.  The patient is pleasant and doing well and  has somewhat masked facies today but minimal tremor noted.  He is alert and pleasant.  HENT:  Head: Normocephalic and atraumatic.  Right Ear: External ear normal.  Left Ear: External ear normal.  Nose: Nose normal.  Mouth/Throat: Oropharynx is clear and moist. No oropharyngeal exudate.  Eyes: Pupils are equal, round, and reactive to light. Conjunctivae and EOM are normal. Right eye exhibits no discharge. Left eye exhibits no discharge. No scleral icterus.  Neck: Normal range of motion. Neck supple. No thyromegaly present.  No bruits thyromegaly or anterior cervical adenopathy  Cardiovascular: Normal rate, normal heart sounds and intact distal pulses.  No murmur heard. The heart is irregular irregular at 60/min.  Distal pulses were difficult to palpate but I believe they were there in a weak fashion.  Pulmonary/Chest: Effort normal and breath sounds normal. He has no wheezes. He has no rales.  Clear anteriorly and posteriorly and no axillary adenopathy  or chest wall masses.  Abdominal: Soft. Bowel sounds are normal. He exhibits no mass. There is no tenderness.  No abdominal  tenderness to palpation.  No liver or spleen enlargement.  No masses.  No bruits.  No inguinal adenopathy.  Genitourinary:  Genitourinary Comments: Patient is followed by Dr. Jeffie Pollock regularly  Musculoskeletal: Normal range of motion. He exhibits no edema.  Lymphadenopathy:    He has no cervical adenopathy.  Neurological: He is alert and oriented to person, place, and time. He has normal reflexes. No cranial nerve deficit.  Reflexes are 1+ and equal bilaterally.  Patient has good grip strength and lower extremity strength bilaterally.  No obvious tremor was noted today on exam.  His facial expressions were somewhat flat.  Skin: Skin is warm and dry. No rash noted.  Psychiatric: He has a normal mood and affect. His behavior is normal. Judgment and thought content normal.  Mood affect and behavior were normal for this  patient and positive and that he is going to treat his Parkinson's disease regularly with regular activity.  Nursing note and vitals reviewed.  BP (!) 85/49 (BP Location: Left Arm)   Pulse (!) 59   Temp (!) 97.3 F (36.3 C) (Oral)   Ht 5' 6.5" (1.689 m)   Wt 160 lb (72.6 kg)   BMI 25.44 kg/m         Assessment & Plan:  1. Pure hypercholesterolemia -All cholesterol numbers were excellent and he will continue with current treatment and therapeutic lifestyle changes  2. Hypertension -Blood pressure is good he will continue with current treatment and sodium restriction  3. Atrial fibrillation, unspecified type ([place]) -Heart remains in atrial fibrillation at about 60/min and he will continue with follow-up with Dr. Wynonia Lawman his cardiologist  4. Parkinson's -Parkinson's seems to be under good control he will continue to follow-up with Dr. Jannifer Franklin his neurologist about every 6 months.  5. Vitamin D deficiency -Continue with vitamin D replacement  6. Prostate cancer [facility]) -Continue follow-up with urology  7. Parkinson disease ([place]) -Continue follow-up with neurology  8. Seasonal allergic rhinitis due to pollen -Continue allergy treatment  9.  Chronic kidney disease -Creatinine was slightly more elevated this time and hemoglobin was slightly decreased this time and we will repeat the BMP and the CBC in about 4 weeks and he will return in FOBT.  Patient Instructions                       Medicare Annual Wellness Visit  [facility] and the medical providers at [facility] strive to bring you the best medical care.  In doing so we not only want to address your current medical conditions and concerns but also to detect new conditions early and prevent illness, disease and health-related problems.    Medicare offers a yearly Wellness Visit which allows our clinical staff to assess your need for preventative services including immunizations, lifestyle education,  counseling to decrease risk of preventable diseases and screening for fall risk and other medical concerns.    This visit is provided free of charge (no copay) for all Medicare recipients. The clinical pharmacists at [facility] have begun to conduct these Wellness Visits which will also include a thorough review of all your medications.    As you primary medical provider recommend that you make an appointment for your Annual Wellness Visit if you have not done so already this year.  You may set  up this appointment before you leave today or you may call back ([phone]) and schedule an appointment.  Please make sure when you call that you mention that you are scheduling your Annual Wellness Visit with the clinical pharmacist so that the appointment may be made for the proper length of time.     Continue current medications. Continue good therapeutic lifestyle changes which include good diet and exercise. Fall precautions discussed with patient. If an FOBT was given today- please return it to our front desk. If you are over 24 years old - you may need Prevnar 24 or the adult Pneumonia vaccine.  **Flu shots are available--- please call and schedule a FLU-CLINIC appointment**  After your visit with [place] today you will receive a survey in the mail or online from [employer] regarding your care with [place]. Please take a moment to fill this out. Your feedback is very important to [place] as you can help [place] better understand your patient needs as well as improve your experience and satisfaction. WE CARE ABOUT YOU!!!   The patient should try along with his wife some MG 12.  This is magnesium which can be massaged into the areas of the body that hurt more and it can be purchased at [facility] in [place]. The patient should return to the office in about 4 weeks and have his creatinine/BMP repeated along with his CBC since his hemoglobin has dropped some. He will play close attention  to making sure there is no blood in the stool or urine. Continue to follow-up with neurology Continue rehab sessions regularly Drink plenty of fluids and stay well-hydrated    Arrie Senate MD

## 2018-03-29 NOTE — Patient Instructions (Addendum)
Medicare Annual Wellness Visit  [facility] and the medical providers at [facility] strive to bring you the best medical care.  In doing so we not only want to address your current medical conditions and concerns but also to detect new conditions early and prevent illness, disease and health-related problems.    Medicare offers a yearly Wellness Visit which allows our clinical staff to assess your need for preventative services including immunizations, lifestyle education, counseling to decrease risk of preventable diseases and screening for fall risk and other medical concerns.    This visit is provided free of charge (no copay) for all Medicare recipients. The clinical pharmacists at [facility] have begun to conduct these Wellness Visits which will also include a thorough review of all your medications.    As you primary medical provider recommend that you make an appointment for your Annual Wellness Visit if you have not done so already this year.  You may set up this appointment before you leave today or you may call back ([phone]) and schedule an appointment.  Please make sure when you call that you mention that you are scheduling your Annual Wellness Visit with the clinical pharmacist so that the appointment may be made for the proper length of time.     Continue current medications. Continue good therapeutic lifestyle changes which include good diet and exercise. Fall precautions discussed with patient. If an FOBT was given today- please return it to our front desk. If you are over 15 years old - you may need Prevnar 45 or the adult Pneumonia vaccine.  **Flu shots are available--- please call and schedule a FLU-CLINIC appointment**  After your visit with [place] today you will receive a survey in the mail or online from [employer] regarding your care with [place]. Please take a moment to fill this out. Your feedback is very  important to [place] as you can help [place] better understand your patient needs as well as improve your experience and satisfaction. WE CARE ABOUT YOU!!!   The patient should try along with his wife some MG 12.  This is magnesium which can be massaged into the areas of the body that hurt more and it can be purchased at [facility] in [place]. The patient should return to the office in about 4 weeks and have his creatinine/BMP repeated along with his CBC since his hemoglobin has dropped some. He will play close attention to making sure there is no blood in the stool or urine. Continue to follow-up with neurology Continue rehab sessions regularly Drink plenty of fluids and stay well-hydrated

## 2018-04-02 ENCOUNTER — Other Ambulatory Visit: Payer: Medicare Other

## 2018-04-02 DIAGNOSIS — Z1211 Encounter for screening for malignant neoplasm of colon: Secondary | ICD-10-CM | POA: Diagnosis not present

## 2018-04-04 LAB — FECAL OCCULT BLOOD, IMMUNOCHEMICAL: FECAL OCCULT BLD: NEGATIVE

## 2018-04-18 DIAGNOSIS — M65332 Trigger finger, left middle finger: Secondary | ICD-10-CM | POA: Diagnosis not present

## 2018-04-23 ENCOUNTER — Other Ambulatory Visit: Payer: Self-pay | Admitting: Family Medicine

## 2018-04-26 ENCOUNTER — Other Ambulatory Visit: Payer: Medicare Other

## 2018-04-26 DIAGNOSIS — I10 Essential (primary) hypertension: Secondary | ICD-10-CM

## 2018-04-26 DIAGNOSIS — J301 Allergic rhinitis due to pollen: Secondary | ICD-10-CM

## 2018-04-27 LAB — CBC WITH DIFFERENTIAL/PLATELET
BASOS ABS: 0.1 10*3/uL (ref 0.0–0.2)
BASOS: 1 %
EOS (ABSOLUTE): 0.5 10*3/uL — ABNORMAL HIGH (ref 0.0–0.4)
EOS: 5 %
HEMATOCRIT: 36 % — AB (ref 37.5–51.0)
HEMOGLOBIN: 12 g/dL — AB (ref 13.0–17.7)
IMMATURE GRANS (ABS): 0.1 10*3/uL (ref 0.0–0.1)
Immature Granulocytes: 1 %
LYMPHS: 17 %
Lymphocytes Absolute: 1.8 10*3/uL (ref 0.7–3.1)
MCH: 34.3 pg — AB (ref 26.6–33.0)
MCHC: 33.3 g/dL (ref 31.5–35.7)
MCV: 103 fL — ABNORMAL HIGH (ref 79–97)
MONOCYTES: 9 %
Monocytes Absolute: 1 10*3/uL — ABNORMAL HIGH (ref 0.1–0.9)
Neutrophils Absolute: 7.1 10*3/uL — ABNORMAL HIGH (ref 1.4–7.0)
Neutrophils: 67 %
Platelets: 219 10*3/uL (ref 150–450)
RBC: 3.5 x10E6/uL — ABNORMAL LOW (ref 4.14–5.80)
RDW: 11.9 % — ABNORMAL LOW (ref 12.3–15.4)
WBC: 10.5 10*3/uL (ref 3.4–10.8)

## 2018-04-27 LAB — BMP8+EGFR
BUN / CREAT RATIO: 17 (ref 10–24)
BUN: 28 mg/dL — ABNORMAL HIGH (ref 8–27)
CHLORIDE: 101 mmol/L (ref 96–106)
CO2: 24 mmol/L (ref 20–29)
Calcium: 9.1 mg/dL (ref 8.6–10.2)
Creatinine, Ser: 1.66 mg/dL — ABNORMAL HIGH (ref 0.76–1.27)
GFR calc Af Amer: 43 mL/min/{1.73_m2} — ABNORMAL LOW (ref >59)
GFR, EST NON AFRICAN AMERICAN: 37 mL/min/{1.73_m2} — AB (ref >59)
Glucose: 112 mg/dL — ABNORMAL HIGH (ref 65–99)
Potassium: 3.9 mmol/L (ref 3.5–5.2)
Sodium: 142 mmol/L (ref 134–144)

## 2018-04-29 ENCOUNTER — Other Ambulatory Visit: Payer: Self-pay | Admitting: Family Medicine

## 2018-05-02 DIAGNOSIS — M65332 Trigger finger, left middle finger: Secondary | ICD-10-CM | POA: Diagnosis not present

## 2018-05-27 DIAGNOSIS — M65332 Trigger finger, left middle finger: Secondary | ICD-10-CM | POA: Diagnosis not present

## 2018-05-27 NOTE — Progress Notes (Signed)
[facility] Note  05/28/2018     CHIEF COMPLAINT Patient presents for Retina Follow Up   HISTORY OF PRESENT ILLNESS: Keith Hill is a 84 y.o. male who presents to the clinic today for:   HPI    Retina Follow Up    Patient presents with  Dry AMD.  In both eyes.  This started 6 months ago.  Severity is mild.  Since onset it is stable.  I, the attending physician,  performed the HPI with the patient and updated documentation appropriately.          Comments    F/U NoN EXU AMD. Patient states his vision 'is about the same, denies new visual onsets. Patient is taking PreserVision Bid.         Last edited by Bernarda Caffey, MD on 05/28/2018  1:42 PM. (History)      Referring physician: Chipper Herb, MD [place], [place] [id number]  HISTORICAL INFORMATION:   Selected notes from the MEDICAL RECORD NUMBER Referred by Dr. Elliot Dally for concern of ARMD OS;  LEE- 03.20.19 (R. Groat) [BCVA OD: 20/25-2 OS: 20/40-2] Ocular Hx- pseudophakia OS 2.11.19 (R. Groat), cataract OD PMH- parkinsons, A-Fib    CURRENT MEDICATIONS: No current outpatient medications on file. (Ophthalmic Drugs)   No current facility-administered medications for this visit.  (Ophthalmic Drugs)   Current Outpatient Medications (Other)  Medication Sig  . amLODipine (NORVASC) 10 MG tablet TAKE (1/2) TABLET DAILY.  Marland Kitchen atorvastatin (LIPITOR) 20 MG tablet Take 1 tablet (20 mg total) by mouth daily. As directed  . carbidopa-levodopa (SINEMET IR) 25-100 MG tablet TAKE 1&1/2 TABLETS IN THE MORNING AND 1 AT NOON AND EVENING  . Cholecalciferol (VITAMIN D3) 5000 UNITS CAPS Take 1 capsule by mouth 4 (four) times a week. Reported on 09/14/2015  . docusate sodium (COLACE) 100 MG capsule Take 100 mg by mouth 2 (two) times daily. daily  . doxylamine, Sleep, (UNISOM) 25 MG tablet Take 12.5 mg by mouth at bedtime as needed.  Marland Kitchen ELIQUIS 5 MG TABS tablet TAKE (1) TABLET TWICE A DAY.  .  fluticasone (FLONASE) 50 MCG/ACT nasal spray Place into both nostrils daily.  . Folic [id number] (FOLGARD PO) Take by mouth.  Marland Kitchen guaiFENesin (MUCINEX) 600 MG 12 hr tablet Take 600 mg by mouth 2 (two) times daily as needed.  [name] Oil 300 MG CAPS Take 500 mg by mouth daily. Reported on 09/14/2015  . magic mouthwash SOLN Take 15 mLs by mouth 4 (four) times daily as needed for mouth pain.  . Magnesium 250 MG TABS Take 2 tablets by mouth at bedtime as needed.   . metoprolol (LOPRESSOR) 50 MG tablet Take 50 mg by mouth daily.   . Multiple Vitamins-Minerals (PRESERVISION AREDS 2 PO) Take by mouth.  . Probiotic Product ([place]) Take by mouth 1 dose over 24 hours.    . rasagiline (AZILECT) 1 MG TABS tablet TAKE 1 TABLET DAILY  . terazosin (HYTRIN) 2 MG capsule TAKE (1) CAPSULE DAILY AT BEDTIME.  . valsartan-hydrochlorothiazide (DIOVAN HCT) 160-12.5 MG tablet Take 1 tablet by mouth daily.  Marland Kitchen VIRT-GARD 2.2-25-1 MG TABS TAKE 1 TABLET DAILY   No current facility-administered medications for this visit.  (Other)      REVIEW OF SYSTEMS: ROS    Positive for: Eyes   Negative for: Constitutional, Gastrointestinal, Neurological, Skin, Genitourinary, Musculoskeletal, HENT, Endocrine, Cardiovascular, Respiratory, Psychiatric, Allergic/Imm, Heme/Lymph   Last edited by  Zenovia Jordan, LPN on 27/02/8241 35:36 PM. (History)       ALLERGIES Allergies  Allergen Reactions  . Aspirin     Upsets stomach  . Aspirin Nausea And Vomiting  . Calicum Glycerophosphate   . Niaspan [Niacin Er]   . Zocor [Simvastatin]     PAST MEDICAL HISTORY Past Medical History:  Diagnosis Date  . Allergic rhinitis, cause unspecified   . Cardiac dysrhythmia, unspecified   . Colon polyps   . Essential and other specified forms of tremor   . GERD (gastroesophageal reflux disease)   . Hemorrhoids, external   . Hiatal hernia   . Hyperlipemia   . Hyperlipidemia   . Hyperplasia of prostate    . Hypertension   . Lumbar disc disease   . Pancreatitis 1976  . Paralysis agitans ([place]) 11/07/2012  . Parkinson disease ([place])   . Peyronie's disease   . Prostate cancer [facility])    prostate   Past Surgical History:  Procedure Laterality Date  . CATARACT EXTRACTION    . EYE SURGERY    . HEMORRHOID SURGERY    . KNEE SURGERY Left   . PROSTATE BIOPSY  04/03/2013  . PROSTATE BIOPSY  11/01/2011  . SHOULDER SURGERY Left     FAMILY HISTORY Family History  Problem Relation Age of Onset  . Heart disease Father   . Kidney failure Father   . Alcoholism Brother   . Heart attack Brother   . Hyperlipidemia Son   . Cancer Neg Hx     SOCIAL HISTORY Social History   Tobacco Use  . Smoking status: Never Smoker  . Smokeless tobacco: Never Used  Substance Use Topics  . Alcohol use: Not Currently    Frequency: Never  . Drug use: Never         OPHTHALMIC EXAM:  Base Eye Exam    Visual Acuity (Snellen - Linear)      Right Left   Dist cc 20/30 -1 20/60 +2   Dist ph cc 20/30 20/50   Correction:  Glasses       Tonometry (Tonopen, 1:07 PM)      Right Left   Pressure 15 17       Pupils      Dark Light Shape React APD   Right 4 3 Round Brisk None   Left 4 3 Round Brisk None       Visual Fields (Counting fingers)      Left Right    Full Full       Extraocular Movement      Right Left    Full, Ortho Full, Ortho       Neuro/Psych    Oriented x3:  Yes   Mood/Affect:  Normal       Dilation    Both eyes:  1.0% Mydriacyl, 2.5% Phenylephrine @ 1:03 PM        Slit Lamp and Fundus Exam    External Exam      Right Left   External Brow ptosis Brow ptosis       Slit Lamp Exam      Right Left   Lids/Lashes Dermatochalasis - upper lid Dermatochalasis - upper lid   Conjunctiva/Sclera White and quiet White and quiet   Cornea Arcus, 1+ diffuse Punctate epithelial erosions, Debris in tear film Arcus, Well healed cataract wounds, faint round IT sub-epi scar   Anterior  Chamber Deep and quiet, narrow temporal angle Deep and quiet   Iris Round and dilated  Round and dilated   Lens 2-3+ Nuclear sclerosis, 2-3+ Cortical cataract, Vacuoles PC IOL in good position   Vitreous Vitreous syneresis, Posterior vitreous detachment, Weiss ring Vitreous syneresis, Posterior vitreous detachment, Weiss ring       Fundus Exam      Right Left   Disc Pink and Sharp Mild Inferior Peripapillary atrophy, Pink and Sharp   C/D Ratio 0.3 0.4   Macula Blunted foveal reflex, Retinal pigment epithelial mottling and clumping, almost confluent Drusen, early RPE atrophy, No heme or edema Flat, RPE mottling and clumping and atrophy centrally, almost confluent Drusen, + PED, No heme or edema   Vessels Mild Copper wiring, Vascular attenuation, AV crossing changes Mild Vascular attenuation, mild AV crossing changes   Periphery Attached, scattered Reticular degeneration, rare peripheral drusen Attached, scattered Reticular degeneration, rare peripheral drusen          IMAGING AND PROCEDURES  Imaging and Procedures for 11/28/17  OCT, Retina - OU - Both Eyes       Right Eye Quality was good. Central Foveal Thickness: 234. Progression has been stable. Findings include normal foveal contour, no IRF, no SRF, retinal drusen , outer retinal atrophy, pigment epithelial detachment.   Left Eye Quality was good. Central Foveal Thickness: 268. Progression has been stable. Findings include normal foveal contour, no IRF, no SRF, retinal drusen , outer retinal atrophy, pigment epithelial detachment (Mild interval increase in PED and SRHM nasal to fovea).   Notes *Images captured and stored on drive  Diagnosis / Impression:  Non-Exudative ARMD OU  Clinical management:  See below  Abbreviations: NFP - Normal foveal profile. CME - cystoid macular edema. PED - pigment epithelial detachment. IRF - intraretinal fluid. SRF - subretinal fluid. EZ - ellipsoid zone. ERM - epiretinal membrane. ORA -  outer retinal atrophy. ORT - outer retinal tubulation. SRHM - subretinal hyper-reflective material                  ASSESSMENT/PLAN:    ICD-10-CM   1. Intermediate stage nonexudative age-related macular degeneration of both eyes H35.3132 OCT, Retina - OU - Both Eyes  2. Posterior vitreous detachment of both eyes H43.813   3. Retinal edema H35.81 OCT, Retina - OU - Both Eyes  4. Pseudophakia, left eye Z96.1   5. Combined forms of age-related cataract of right eye H25.811     1. Age related macular degeneration, non-exudative, both eyes  - The incidence, anatomy, and pathology of dry AMD, risk of progression, and the AREDS and AREDS 2 study including smoking risks discussed with patient.  - continue amsler grid monitoring  - OS with stable central PED with RPE atrophy and clumping -- likely reason OS is worse than OD  - f/u 4-6 months  2. PVD / vitreous syneresis  Discussed findings and prognosis  No RT or RD on 360 peripheral exam  Reviewed s/s of RT/RD  Strict return precautions for any such RT/RD signs/symptoms  3. No retinal edema on exam or OCT  4. Pseudophakia OS  - s/p CE/IOL OS by Dr. Elliot Dally on 02.11.19  - beautiful surgery, doing well  - monitor  5. Combined form age-related cataract OD-  - The symptoms of cataract, surgical options, and treatments and risks were discussed with patient. - discussed diagnosis and progression - likely visually significant - discussed that visual improvement from cataract surgery may be limited by ARMD OD - okay from a retina standpoint to proceed with cataract surgery OD when pt and  surgeon are ready    Ophthalmic Meds Ordered this visit:  No orders of the defined types were placed in this encounter.      Return in about 6 months (around 11/27/2018) for f/u nonex AMD, Dilated Exam, OCT.  There are no Patient Instructions on file for this visit.   Explained the diagnoses, plan, and follow up with the patient and they  expressed understanding.  Patient expressed understanding of the importance of proper follow up care.   This document serves as a record of services personally performed by Gardiner Sleeper, MD, PhD. It was created on their behalf by Ernest Mallick, OA, an ophthalmic assistant. The creation of this record is the provider's dictation and/or activities during the visit.    Electronically signed by: Ernest Mallick, OA  10.07.19 11:43 AM      Gardiner Sleeper, M.D., Ph.D. Diseases & Surgery of the Retina and Vitreous [facility]   I have reviewed the above documentation for accuracy and completeness, and I agree with the above. Gardiner Sleeper, M.D., Ph.D. 06/02/18 11:48 AM   Abbreviations: M myopia (nearsighted); A astigmatism; H hyperopia (farsighted); P presbyopia; Mrx spectacle prescription;  CTL contact lenses; OD right eye; OS left eye; OU both eyes  XT exotropia; ET esotropia; PEK punctate epithelial keratitis; PEE punctate epithelial erosions; DES dry eye syndrome; MGD meibomian gland dysfunction; ATs artificial tears; PFAT's preservative free artificial tears; [place] nuclear sclerotic cataract; PSC posterior subcapsular cataract; ERM epi-retinal membrane; PVD posterior vitreous detachment; RD retinal detachment; DM diabetes mellitus; DR diabetic retinopathy; NPDR non-proliferative diabetic retinopathy; PDR proliferative diabetic retinopathy; CSME clinically significant macular edema; DME diabetic macular edema; dbh dot blot hemorrhages; CWS cotton wool spot; POAG primary open angle glaucoma; C/D cup-to-disc ratio; HVF humphrey visual field; GVF goldmann visual field; OCT optical coherence tomography; IOP intraocular pressure; BRVO Branch retinal vein occlusion; CRVO central retinal vein occlusion; CRAO central retinal artery occlusion; BRAO branch retinal artery occlusion; RT retinal tear; SB scleral buckle; PPV pars plana vitrectomy; VH Vitreous hemorrhage; PRP panretinal laser  photocoagulation; IVK intravitreal kenalog; VMT vitreomacular traction; MH Macular hole;  NVD neovascularization of the disc; NVE neovascularization elsewhere; AREDS age related eye disease study; ARMD age related macular degeneration; POAG primary open angle glaucoma; EBMD epithelial/anterior basement membrane dystrophy; ACIOL anterior chamber intraocular lens; IOL intraocular lens; PCIOL posterior chamber intraocular lens; Phaco/IOL phacoemulsification with intraocular lens placement; [place] photorefractive keratectomy; LASIK laser assisted in situ keratomileusis; HTN hypertension; DM diabetes mellitus; COPD chronic obstructive pulmonary disease

## 2018-05-28 ENCOUNTER — Ambulatory Visit (INDEPENDENT_AMBULATORY_CARE_PROVIDER_SITE_OTHER): Payer: Medicare Other | Admitting: Ophthalmology

## 2018-05-28 ENCOUNTER — Encounter (INDEPENDENT_AMBULATORY_CARE_PROVIDER_SITE_OTHER): Payer: Self-pay | Admitting: Ophthalmology

## 2018-05-28 DIAGNOSIS — H353132 Nonexudative age-related macular degeneration, bilateral, intermediate dry stage: Secondary | ICD-10-CM

## 2018-05-28 DIAGNOSIS — H3581 Retinal edema: Secondary | ICD-10-CM | POA: Diagnosis not present

## 2018-05-28 DIAGNOSIS — H25811 Combined forms of age-related cataract, right eye: Secondary | ICD-10-CM | POA: Diagnosis not present

## 2018-05-28 DIAGNOSIS — Z961 Presence of intraocular lens: Secondary | ICD-10-CM

## 2018-05-28 DIAGNOSIS — H43813 Vitreous degeneration, bilateral: Secondary | ICD-10-CM

## 2018-05-30 ENCOUNTER — Ambulatory Visit (INDEPENDENT_AMBULATORY_CARE_PROVIDER_SITE_OTHER): Payer: Medicare Other

## 2018-05-30 DIAGNOSIS — Z23 Encounter for immunization: Secondary | ICD-10-CM

## 2018-06-05 DIAGNOSIS — D485 Neoplasm of uncertain behavior of skin: Secondary | ICD-10-CM | POA: Diagnosis not present

## 2018-06-05 DIAGNOSIS — L720 Epidermal cyst: Secondary | ICD-10-CM | POA: Diagnosis not present

## 2018-06-19 DIAGNOSIS — Z4802 Encounter for removal of sutures: Secondary | ICD-10-CM | POA: Diagnosis not present

## 2018-06-26 ENCOUNTER — Other Ambulatory Visit: Payer: Medicare Other

## 2018-06-26 DIAGNOSIS — C61 Malignant neoplasm of prostate: Secondary | ICD-10-CM | POA: Diagnosis not present

## 2018-06-27 ENCOUNTER — Encounter: Payer: Self-pay | Admitting: Family Medicine

## 2018-06-27 ENCOUNTER — Ambulatory Visit (INDEPENDENT_AMBULATORY_CARE_PROVIDER_SITE_OTHER): Payer: Medicare Other | Admitting: Family Medicine

## 2018-06-27 VITALS — BP 137/69 | HR 71 | Temp 97.7°F | Ht 66.5 in | Wt 166.0 lb

## 2018-06-27 DIAGNOSIS — K5904 Chronic idiopathic constipation: Secondary | ICD-10-CM

## 2018-06-27 DIAGNOSIS — L821 Other seborrheic keratosis: Secondary | ICD-10-CM

## 2018-06-27 NOTE — Progress Notes (Signed)
BP 137/69   Pulse 71   Temp 97.7 F (36.5 C) (Oral)   Ht 5' 6.5" (1.689 m)   Wt 166 lb (75.3 kg)   BMI 26.39 kg/m    Subjective:    Patient ID: Keith Hill, male    DOB: Nov 03, 1931, 84 y.o.   MRN: [id number]  HPI: Keith Hill is a 84 y.o. male presenting on 06/27/2018 for Constipation (x 3 weeks) and spot on shoulder (left shoulder. Patient states he just noticed it the last 3 weeks.)   HPI Constipation Patient comes in complaining of constipation he has been using Dulcolax and a stool softener for it which helps most of the time.  This time is been having issues over the past 3 weeks but he has had this recurrently over the past few years.  He has had colonoscopies that have been normal.  He denies any abdominal pain or blood in his stool or fevers or chills or flank pain.  He denies any urinary issues.  Patient does have prostate cancer wonders if it can be related to that  New skin lesion on left upper shoulder on the back that patient wants to come get looked at.  He says he is noticed over the past 3 weeks and wanted to make sure it was not anything serious.  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  Relevant past medical, surgical, family and social history reviewed and updated as indicated. Interim medical history since our last visit reviewed. Allergies and medications reviewed and updated.  Review of Systems  Constitutional: Negative for chills and fever.  Eyes: Negative for visual disturbance.  Respiratory: Negative for shortness of breath and wheezing.   Cardiovascular: Negative for chest pain and leg swelling.  Gastrointestinal: Positive for constipation. Negative for abdominal pain, diarrhea, nausea and vomiting.  Musculoskeletal: Negative for back pain and gait problem.  Skin: Negative for rash.  All other systems reviewed and are negative.   Per HPI unless specifically indicated above     Objective:    BP 137/69   Pulse 71   Temp 97.7 F (36.5 C)  (Oral)   Ht 5' 6.5" (1.689 m)   Wt 166 lb (75.3 kg)   BMI 26.39 kg/m   Wt Readings from Last 3 Encounters:  06/27/18 166 lb (75.3 kg)  03/29/18 160 lb (72.6 kg)  03/21/18 163 lb 8 oz (74.2 kg)    Physical Exam  Constitutional: He is oriented to person, place, and time. He appears well-developed and well-nourished. No distress.  Eyes: Conjunctivae are normal. No scleral icterus.  Neck: Neck supple. No thyromegaly present.  Cardiovascular: Normal rate, regular rhythm, normal heart sounds and intact distal pulses.  No murmur heard. Pulmonary/Chest: Effort normal and breath sounds normal. No respiratory distress. He has no wheezes.  Musculoskeletal: Normal range of motion. He exhibits no edema.  Lymphadenopathy:    He has no cervical adenopathy.  Neurological: He is alert and oriented to person, place, and time. Coordination normal.  Skin: Skin is warm and dry. Lesion (small SK matching other lesions on his back) noted. No rash noted. He is not diaphoretic.  Psychiatric: He has a normal mood and affect. His behavior is normal.  Nursing note and vitals reviewed.     Assessment & Plan:   Problem List Items Addressed This Visit    None    Visit Diagnoses    Chronic idiopathic constipation    -  Primary   Patient  has recurrent issues with constipation, this time is been at least 3 weeks and sees able to have good normal bowel movements and he has been using Dulco   Seborrheic keratosis          Gave samples for 20 days of 72 mg of Linzess for him to try and he will follow-up in PCP with a visit in 4 weeks.  Follow up plan: Return if symptoms worsen or fail to improve.  Counseling provided for all of the vaccine components No orders of the defined types were placed in this encounter.   Caryl Pina, MD [facility] 06/27/2018, 1:44 PM

## 2018-07-01 ENCOUNTER — Other Ambulatory Visit: Payer: Self-pay | Admitting: Family Medicine

## 2018-07-03 DIAGNOSIS — R972 Elevated prostate specific antigen [PSA]: Secondary | ICD-10-CM | POA: Diagnosis not present

## 2018-07-03 DIAGNOSIS — R351 Nocturia: Secondary | ICD-10-CM | POA: Diagnosis not present

## 2018-07-03 DIAGNOSIS — C61 Malignant neoplasm of prostate: Secondary | ICD-10-CM | POA: Diagnosis not present

## 2018-07-08 ENCOUNTER — Other Ambulatory Visit: Payer: Self-pay | Admitting: Family Medicine

## 2018-07-22 DIAGNOSIS — L570 Actinic keratosis: Secondary | ICD-10-CM | POA: Diagnosis not present

## 2018-07-24 ENCOUNTER — Other Ambulatory Visit: Payer: Medicare Other

## 2018-07-24 DIAGNOSIS — I10 Essential (primary) hypertension: Secondary | ICD-10-CM

## 2018-07-24 DIAGNOSIS — E7800 Pure hypercholesterolemia, unspecified: Secondary | ICD-10-CM | POA: Diagnosis not present

## 2018-07-25 LAB — HEPATIC FUNCTION PANEL
ALBUMIN: 4.4 g/dL (ref 3.5–4.7)
ALK PHOS: 112 IU/L (ref 39–117)
ALT: 33 IU/L (ref 0–44)
AST: 25 IU/L (ref 0–40)
BILIRUBIN TOTAL: 0.7 mg/dL (ref 0.0–1.2)
Bilirubin, Direct: 0.28 mg/dL (ref 0.00–0.40)
Total Protein: 7.4 g/dL (ref 6.0–8.5)

## 2018-07-25 LAB — CBC WITH DIFFERENTIAL/PLATELET
BASOS: 1 %
Basophils Absolute: 0.1 10*3/uL (ref 0.0–0.2)
EOS (ABSOLUTE): 0.3 10*3/uL (ref 0.0–0.4)
EOS: 4 %
HEMATOCRIT: 35.5 % — AB (ref 37.5–51.0)
HEMOGLOBIN: 12 g/dL — AB (ref 13.0–17.7)
Immature Grans (Abs): 0 10*3/uL (ref 0.0–0.1)
Immature Granulocytes: 0 %
LYMPHS ABS: 1.4 10*3/uL (ref 0.7–3.1)
Lymphs: 19 %
MCH: 34.6 pg — ABNORMAL HIGH (ref 26.6–33.0)
MCHC: 33.8 g/dL (ref 31.5–35.7)
MCV: 102 fL — AB (ref 79–97)
MONOCYTES: 11 %
Monocytes Absolute: 0.8 10*3/uL (ref 0.1–0.9)
NEUTROS ABS: 4.8 10*3/uL (ref 1.4–7.0)
Neutrophils: 65 %
Platelets: 203 10*3/uL (ref 150–450)
RBC: 3.47 x10E6/uL — AB (ref 4.14–5.80)
RDW: 11.8 % — ABNORMAL LOW (ref 12.3–15.4)
WBC: 7.4 10*3/uL (ref 3.4–10.8)

## 2018-07-25 LAB — BASIC METABOLIC PANEL
BUN/Creatinine Ratio: 19 (ref 10–24)
BUN: 25 mg/dL (ref 8–27)
CO2: 20 mmol/L (ref 20–29)
CREATININE: 1.35 mg/dL — AB (ref 0.76–1.27)
Calcium: 9.6 mg/dL (ref 8.6–10.2)
Chloride: 104 mmol/L (ref 96–106)
GFR, EST AFRICAN AMERICAN: 55 mL/min/{1.73_m2} — AB (ref >59)
GFR, EST NON AFRICAN AMERICAN: 48 mL/min/{1.73_m2} — AB (ref >59)
Glucose: 92 mg/dL (ref 65–99)
Potassium: 3.8 mmol/L (ref 3.5–5.2)
Sodium: 143 mmol/L (ref 134–144)

## 2018-07-25 LAB — LIPID PANEL
CHOL/HDL RATIO: 2 ratio (ref 0.0–5.0)
Cholesterol, Total: 114 mg/dL (ref 100–199)
HDL: 56 mg/dL (ref >39)
LDL Calculated: 47 mg/dL (ref 0–99)
Triglycerides: 54 mg/dL (ref 0–149)
VLDL Cholesterol Cal: 11 mg/dL (ref 5–40)

## 2018-08-02 ENCOUNTER — Ambulatory Visit: Payer: Medicare Other | Admitting: Family Medicine

## 2018-08-06 ENCOUNTER — Encounter: Payer: Self-pay | Admitting: Family Medicine

## 2018-08-06 ENCOUNTER — Ambulatory Visit (INDEPENDENT_AMBULATORY_CARE_PROVIDER_SITE_OTHER): Payer: Medicare Other | Admitting: Family Medicine

## 2018-08-06 VITALS — BP 138/76 | HR 82 | Temp 97.8°F | Ht 66.5 in | Wt 159.0 lb

## 2018-08-06 DIAGNOSIS — E7800 Pure hypercholesterolemia, unspecified: Secondary | ICD-10-CM | POA: Diagnosis not present

## 2018-08-06 DIAGNOSIS — K5904 Chronic idiopathic constipation: Secondary | ICD-10-CM | POA: Diagnosis not present

## 2018-08-06 DIAGNOSIS — I10 Essential (primary) hypertension: Secondary | ICD-10-CM

## 2018-08-06 DIAGNOSIS — E559 Vitamin D deficiency, unspecified: Secondary | ICD-10-CM

## 2018-08-06 DIAGNOSIS — G20 Parkinson's disease: Secondary | ICD-10-CM

## 2018-08-06 DIAGNOSIS — I4891 Unspecified atrial fibrillation: Secondary | ICD-10-CM

## 2018-08-06 NOTE — Patient Instructions (Addendum)
Medicare Annual Wellness Visit  [facility] and the medical providers at [facility] strive to bring you the best medical care.  In doing so we not only want to address your current medical conditions and concerns but also to detect new conditions early and prevent illness, disease and health-related problems.    Medicare offers a yearly Wellness Visit which allows our clinical staff to assess your need for preventative services including immunizations, lifestyle education, counseling to decrease risk of preventable diseases and screening for fall risk and other medical concerns.    This visit is provided free of charge (no copay) for all Medicare recipients. The clinical pharmacists at [facility] have begun to conduct these Wellness Visits which will also include a thorough review of all your medications.    As you primary medical provider recommend that you make an appointment for your Annual Wellness Visit if you have not done so already this year.  You may set up this appointment before you leave today or you may call back ([phone]) and schedule an appointment.  Please make sure when you call that you mention that you are scheduling your Annual Wellness Visit with the clinical pharmacist so that the appointment may be made for the proper length of time.     Continue current medications. Continue good therapeutic lifestyle changes which include good diet and exercise. Fall precautions discussed with patient. If an FOBT was given today- please return it to our front desk. If you are over 51 years old - you may need Prevnar 12 or the adult Pneumonia vaccine.  **Flu shots are available--- please call and schedule a FLU-CLINIC appointment**  After your visit with [place] today you will receive a survey in the mail or online from [employer] regarding your care with [place]. Please take a moment to fill this out. Your feedback is very  important to [place] as you can help [place] better understand your patient needs as well as improve your experience and satisfaction. WE CARE ABOUT YOU!!!   Continue with physical therapy Always be careful and do not put the patient at risk for falling Drink plenty of water Try MiraLAX 1 scoop daily and reduce this if his bowel movements improve

## 2018-08-06 NOTE — Progress Notes (Signed)
Subjective:    Patient ID: Keith Hill, male    DOB: 02-08-1932, 84 y.o.   MRN: [id number]  HPI Pt here for follow up and management of chronic medical problems which includes hypertension and a fib. He is taking medication regularly.  The patient has been followed by the cardiologist in the past.  He has his ongoing back pain issues but does continue to try to exercise regularly to help his Parkinson's disease.  He is also seen several specialist regarding his back pain but not anyone has been able to help him or his wife.  He does see the neurologist regularly and tries to exercise and has done extremely well regarding his Parkinson's disease.  He has had lab work done and this will be reviewed with him during the office today visit all liver function tests were normal.  The white blood cell count was not elevated.  The hemoglobin remains slightly decreased at 12.0.  The platelet count was adequate.  The blood sugar was good at 92.  The creatinine was in fact slightly decreased from where it has been in the past at 1.35.  All of the electrolytes including potassium are good.  All cholesterol numbers including LDL-C cholesterol were excellent.  The patient is doing well other than worrying about his wife who is having more falls.  The patient continues to have back pain he continues to see the neurologist for his Parkinson's.  He goes to the physical therapy area in front of the office 3 times weekly and is very proud of the fact that he can do this and has maintained his stamina with his Parkinson's disease and we are proud of him to.  He denies any chest pain pressure tightness or palpitations.  He does get somewhat short winded at times especially when he is climbing.  He denies any trouble with his stomach including nausea vomiting diarrhea blood in the stool or black tarry bowel movements.  He does have some constipation and I recommended MiraLAX for this.  He says if he takes Linzess that he has an  explosive bout of diarrhea and then he is cleaned out and I felt like this is a little bit too harsh for him.  He is passing his water without problems.     Patient Active Problem List   Diagnosis Date Noted  . Benign hypertension with chronic kidney disease, stage III ([place]) 12/19/2017  . 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 Aortic atherosclerosis ([place]) 10/29/2017  . Spondylosis without myelopathy or radiculopathy, lumbar region 12/05/2016  . Back pain 11/18/2013  . Vitamin D deficiency 08/28/2013  . Hiatal hernia with gastroesophageal reflux 08/28/2013  . CAD (coronary artery disease) 04/08/2013  . Essential and other specified forms of tremor 03/31/2013  . Parkinson disease ([place]) 11/07/2012  . Hemorrhoids 02/17/2011  . Hyperlipidemia with target LDL less than 70   . Prostate cancer ([place])   . Hypertension   . Allergic rhinitis   . Colon polyps, history of    Outpatient Encounter Medications as of 08/06/2018  Medication Sig  . amLODipine (NORVASC) 10 MG tablet TAKE (1/2) TABLET DAILY.  Marland Kitchen atorvastatin (LIPITOR) 20 MG tablet Take 1 tablet (20 mg total) by mouth daily. As directed  . carbidopa-levodopa (SINEMET IR) 25-100 MG tablet TAKE 1&1/2 TABLETS IN THE MORNING AND 1 AT NOON AND EVENING  . Cholecalciferol (VITAMIN D3) 5000 UNITS CAPS Take 1 capsule by mouth  4 (four) times a week. Reported on 09/14/2015  . docusate sodium (COLACE) 100 MG capsule Take 100 mg by mouth 2 (two) times daily. daily  . doxylamine, Sleep, (UNISOM) 25 MG tablet Take 12.5 mg by mouth at bedtime as needed.  Marland Kitchen ELIQUIS 5 MG TABS tablet TAKE (1) TABLET TWICE A DAY.  . fluticasone (FLONASE) 50 MCG/ACT nasal spray Place into both nostrils daily.  . Folic [id number] (FOLGARD PO) Take by mouth.  Marland Kitchen guaiFENesin (MUCINEX) 600 MG 12 hr tablet Take 600 mg by mouth 2 (two) times daily as needed.  [name] Oil 300 MG CAPS Take 500 mg by mouth  daily. Reported on 09/14/2015  . magic mouthwash SOLN Take 15 mLs by mouth 4 (four) times daily as needed for mouth pain.  . Magnesium 250 MG TABS Take 2 tablets by mouth at bedtime as needed.   . metoprolol (LOPRESSOR) 50 MG tablet Take 50 mg by mouth daily.   . Multiple Vitamins-Minerals (PRESERVISION AREDS 2 PO) Take by mouth.  . Probiotic Product ([place]) Take by mouth 1 dose over 24 hours.    . rasagiline (AZILECT) 1 MG TABS tablet TAKE 1 TABLET DAILY  . terazosin (HYTRIN) 2 MG capsule TAKE (1) CAPSULE DAILY AT BEDTIME.  . valsartan-hydrochlorothiazide (DIOVAN HCT) 160-12.5 MG tablet Take 1 tablet by mouth daily.  Marland Kitchen VIRT-GARD 2.2-25-1 MG TABS TAKE 1 TABLET DAILY   No facility-administered encounter medications on file as of 08/06/2018.      Review of Systems  Constitutional: Negative.   HENT: Negative.   Eyes: Negative.   Respiratory: Negative.   Cardiovascular: Negative.   Gastrointestinal: Negative.   Endocrine: Negative.   Genitourinary: Negative.   Musculoskeletal: Positive for back pain.  Skin: Negative.   Allergic/Immunologic: Negative.   Neurological: Negative.   Hematological: Negative.   Psychiatric/Behavioral: Negative.        Objective:   Physical Exam Vitals signs and nursing note reviewed.  Constitutional:      Appearance: Normal appearance. He is well-developed and normal weight.     Comments: The patient is pleasant and relaxed but stressed over worrying about his wife who seems to be becoming more incapacitated with her back pain  HENT:     Head: Normocephalic and atraumatic.     Comments: HEENT within normal limits    Right Ear: Tympanic membrane, ear canal and external ear normal. There is no impacted cerumen.     Left Ear: Tympanic membrane, ear canal and external ear normal. There is no impacted cerumen.     Nose: Nose normal.     Mouth/Throat:     Mouth: Mucous membranes are moist.     Pharynx: Oropharynx is clear. No  oropharyngeal exudate.  Eyes:     General: No scleral icterus.       Right eye: No discharge.        Left eye: No discharge.     Extraocular Movements: Extraocular movements intact.     Conjunctiva/sclera: Conjunctivae normal.     Pupils: Pupils are equal, round, and reactive to light.  Neck:     Musculoskeletal: Normal range of motion and neck supple.     Thyroid: No thyromegaly.     Vascular: No carotid bruit.     Trachea: No tracheal deviation.     Comments: No thyromegaly or anterior cervical adenopathy Cardiovascular:     Rate and Rhythm: Normal rate and regular rhythm.     Heart sounds: Normal heart sounds.  No murmur.  Pulmonary:     Effort: Pulmonary effort is normal. No respiratory distress.     Breath sounds: Normal breath sounds. No wheezing or rales.     Comments: Clear anteriorly and posteriorly Abdominal:     General: Abdomen is flat. Bowel sounds are normal.     Palpations: Abdomen is soft. There is no mass.     Tenderness: There is no abdominal tenderness.  Musculoskeletal: Normal range of motion.        General: No tenderness.     Right lower leg: No edema.     Left lower leg: No edema.  Lymphadenopathy:     Cervical: No cervical adenopathy.  Skin:    General: Skin is warm and dry.     Coloration: Skin is not pale.     Findings: No rash.  Neurological:     General: No focal deficit present.     Mental Status: He is alert and oriented to person, place, and time. Mental status is at baseline.     Cranial Nerves: No cranial nerve deficit.     Sensory: No sensory deficit.     Deep Tendon Reflexes: Reflexes are normal and symmetric.     Comments: No tremor noted on exam today.  Psychiatric:        Mood and Affect: Mood normal.        Behavior: Behavior normal.        Thought Content: Thought content normal.        Judgment: Judgment normal.     Comments: Mood affect and behavior are normal for this patient    BP 138/76 (BP Location: Left Arm)   Pulse 82    Temp 97.8 F (36.6 C) (Oral)   Ht 5' 6.5" (1.689 m)   Wt 159 lb (72.1 kg)   BMI 25.28 kg/m         Assessment & Plan:  1. Pure hypercholesterolemia -All numbers were excellent and patient will continue with therapeutic lifestyle changes and current statin treatment  2. Hypertension -Blood pressure is good he will continue with current treatment  3. Atrial fibrillation, unspecified type [facility]) -Patient has controlled rate atrial for but.  He had atrial fib today at about 72/min.  4. Parkinson's -Continue to follow-up with neurology.  No tremor noted today.  5. Vitamin D deficiency -Continue with vitamin D replacement  6. Parkinson disease [facility]) -Follow-up with neurology as planned and continue weekly exercise regimen  7. Chronic idiopathic constipation -Take MiraLAX 1 scoop nightly increase this as needed or decrease this as needed and continue to drink plenty of fluids and stay well-hydrated No orders of the defined types were placed in this encounter.  Patient Instructions                       Medicare Annual Wellness Visit  [facility] and the medical providers at [facility] strive to bring you the best medical care.  In doing so we not only want to address your current medical conditions and concerns but also to detect new conditions early and prevent illness, disease and health-related problems.    Medicare offers a yearly Wellness Visit which allows our clinical staff to assess your need for preventative services including immunizations, lifestyle education, counseling to decrease risk of preventable diseases and screening for fall risk and other medical concerns.    This visit is provided free of charge (no copay) for all Medicare recipients. The  clinical pharmacists at [facility] have begun to conduct these Wellness Visits which will also include a thorough review of all your medications.    As you primary medical  provider recommend that you make an appointment for your Annual Wellness Visit if you have not done so already this year.  You may set up this appointment before you leave today or you may call back ([phone]) and schedule an appointment.  Please make sure when you call that you mention that you are scheduling your Annual Wellness Visit with the clinical pharmacist so that the appointment may be made for the proper length of time.     Continue current medications. Continue good therapeutic lifestyle changes which include good diet and exercise. Fall precautions discussed with patient. If an FOBT was given today- please return it to our front desk. If you are over 15 years old - you may need Prevnar 11 or the adult Pneumonia vaccine.  **Flu shots are available--- please call and schedule a FLU-CLINIC appointment**  After your visit with [place] today you will receive a survey in the mail or online from [employer] regarding your care with [place]. Please take a moment to fill this out. Your feedback is very important to [place] as you can help [place] better understand your patient needs as well as improve your experience and satisfaction. WE CARE ABOUT YOU!!!   Continue with physical therapy Always be careful and do not put the patient at risk for falling Drink plenty of water Try MiraLAX 1 scoop daily and reduce this if his bowel movements improve  Arrie Senate MD

## 2018-08-08 ENCOUNTER — Ambulatory Visit: Payer: Medicare Other | Admitting: Cardiology

## 2018-08-19 NOTE — Progress Notes (Signed)
Cardiology Office Note:    Date:  08/20/2018   ID:  Keith Hill, DOB 1932/03/23, MRN [id number]  PCP:  Keith Herb, MD  Cardiologist:  Keith More, MD    Referring MD: Keith Herb, MD    ASSESSMENT:    1. Preoperative cardiovascular examination   2. Chronic atrial fibrillation   3. Chronic anticoagulation   4. Coronary artery disease of native artery of native heart with stable angina pectoris ([place])   5. Benign hypertension with chronic kidney disease, stage III ([place])   6. Hyperlipidemia with target LDL less than 70    PLAN:    In order of problems listed above:  1. Stable he is optimized and hold his anticoagulant 2 days prior 1 day post hand surgery 2. Stable rate controlled continue beta-blocker anticoagulant 3. Stable has had no bleeding complications and continue his current anticoagulant 4. Stable CAD New York Heart Association class I continue medical treatment including high intensity statin anticoagulant calcium channel blocker beta-blocker 5. Stable blood pressure target continue current treatment 6. Stable lipids at target no evidence of toxicity continue statin   Next appointment: 1 year   Medication Adjustments/Labs and Tests Ordered: Current medicines are reviewed at length with the patient today.  Concerns regarding medicines are outlined above.  No orders of the defined types were placed in this encounter.  No orders of the defined types were placed in this encounter.   Chief Complaint  Patient presents with  . Coronary Artery Disease  . Hyperlipidemia  . Hypertension    History of Present Illness:    Keith Hill is a 84 y.o. male with a hx of CAD hypertension and hyperlipidemia last seen 1 year ago Keith Hill. He is seen today fpr preoperative evaluation for hand surgery trigger finger. Compliance with diet, lifestyle and medications: Yes  He is here today because of impending hand surgery trigger finger outpatient.  He is on chronic  anticoagulation with atrial fibrillation and we will have him hold his anticoagulant 2 full days 4 doses pre-and 1 day to full doses post procedure.  His atrial fibrillation is rate controlled asymptomatic.  Coronary artery disease is New York Heart Association class I he has had no anginal discomfort normal exercise tolerance he continues to golf and does not require preoperative ischemia evaluation.  His hypertension is stable and will continue current treatment including long-acting calcium channel blocker beta-blocker and ARB diuretic.  Dyslipidemia stable LDL is at target and he will continue his high intensity statin.  I will plan to see him back in the office in 1 year or sooner if he is having cardiovascular symptoms.  Prior to this visit I reviewed Keith Hill office records correspondence sent to his hand surgeon instructions given to the patient in writing regarding anticoagulation perioperatively Past Medical History:  Diagnosis Date  . Allergic rhinitis, cause unspecified   . Cardiac dysrhythmia, unspecified   . Colon polyps   . Essential and other specified forms of tremor   . GERD (gastroesophageal reflux disease)   . Hemorrhoids, external   . Hiatal hernia   . Hyperlipemia   . Hyperlipidemia   . Hyperplasia of prostate   . Hypertension   . Lumbar disc disease   . Pancreatitis 1976  . Paralysis agitans ([place]) 11/07/2012  . Parkinson disease ([place])   . Peyronie's disease   . Prostate cancer [facility])    prostate    Past Surgical History:  Procedure Laterality Date  .  CATARACT EXTRACTION    . EYE SURGERY    . HEMORRHOID SURGERY    . KNEE SURGERY Left   . PROSTATE BIOPSY  04/03/2013  . PROSTATE BIOPSY  11/01/2011  . SHOULDER SURGERY Left     Current Medications: Current Meds  Medication Sig  . amLODipine (NORVASC) 10 MG tablet TAKE (1/2) TABLET DAILY.  Marland Kitchen atorvastatin (LIPITOR) 20 MG tablet Take 1 tablet (20 mg total) by mouth daily. As directed (Patient taking differently:  Take 20 mg by mouth daily. As directed does not take on Wednesday)  . carbidopa-levodopa (SINEMET IR) 25-100 MG tablet TAKE 1&1/2 TABLETS IN THE MORNING AND 1 AT NOON AND EVENING  . Cholecalciferol (VITAMIN D3) 5000 UNITS CAPS Take 1 capsule by mouth 4 (four) times a week. Reported on 09/14/2015  . docusate sodium (COLACE) 100 MG capsule Take 100 mg by mouth daily. daily  . doxylamine, Sleep, (UNISOM) 25 MG tablet Take 12.5 mg by mouth at bedtime as needed.  Marland Kitchen ELIQUIS 5 MG TABS tablet TAKE (1) TABLET TWICE A DAY.  . fluticasone (FLONASE) 50 MCG/ACT nasal spray Place 1 spray into both nostrils daily.   Marland Kitchen guaiFENesin (MUCINEX) 600 MG 12 hr tablet Take 600 mg by mouth 2 (two) times daily as needed.  [name] Oil 300 MG CAPS Take 500 mg by mouth daily. Reported on 09/14/2015  . Magnesium 250 MG TABS Take 2 tablets by mouth at bedtime as needed.   . metoprolol (LOPRESSOR) 50 MG tablet Take 50 mg by mouth daily.   . Multiple Vitamins-Minerals (PRESERVISION AREDS 2 PO) Take 1 tablet by mouth daily.   . Probiotic Product ([place]) Take by mouth 1 dose over 24 hours.    . rasagiline (AZILECT) 1 MG TABS tablet TAKE 1 TABLET DAILY  . terazosin (HYTRIN) 2 MG capsule TAKE (1) CAPSULE DAILY AT BEDTIME.  . valsartan-hydrochlorothiazide (DIOVAN HCT) 160-12.5 MG tablet Take 1 tablet by mouth daily.  Marland Kitchen VIRT-GARD 2.2-25-1 MG TABS TAKE 1 TABLET DAILY     Allergies:   Aspirin; Aspirin; Calicum glycerophosphate; Niaspan [niacin er]; and Zocor [simvastatin]   Social History   Socioeconomic History  . Marital status: Married    Spouse name: Keith Hill  . Number of children: 1  . Years of education: College  . Highest education level: Not on file  Occupational History  . Occupation: retired    Comment: Building control surveyor: RETIRED  . Occupation: retired  Scientific laboratory technician  . Financial resource strain: Not hard at all  . Food insecurity:    Worry: Never true    Inability: Never true  .  Transportation needs:    Medical: No    Non-medical: No  Tobacco Use  . Smoking status: Never Smoker  . Smokeless tobacco: Never Used  Substance and Sexual Activity  . Alcohol use: Never    Frequency: Never  . Drug use: Never  . Sexual activity: Yes  Lifestyle  . Physical activity:    Days per week: 0 days    Minutes per session: 0 min  . Stress: Not at all  Relationships  . Social connections:    Talks on phone: Hill than three times a week    Gets together: Hill than three times a week    Attends religious service: Hill than 4 times per year    Active member of club or organization: Yes    Attends meetings of clubs or organizations: Hill than 4 times per  year    Relationship status: Married  Other Topics Concern  . Not on file  Social History Narrative   ** Merged History Encounter **       Patient lives at home with spouse. Caffeine Use: 2-3 sodas daily Patient is right handed.     Family History: The patient's family history includes Alcoholism in his brother; Heart attack in his brother; Heart disease in his father; Hyperlipidemia in his son; Kidney failure in his father. There is no history of Cancer. ROS:   Please see the history of present illness.    All other systems reviewed and are negative.  EKGs/Labs/Other Studies Reviewed:    The following studies were reviewed today:  EKG:  EKG ordered today.  The ekg ordered today demonstrates atrial fibrillation controlled ventricular rates  Recent Labs: 07/24/2018: ALT 33; BUN 25; Creatinine, Ser 1.35; Hemoglobin 12.0; Platelets 203; Potassium 3.8; Sodium 143  Recent Lipid Panel    Component Value Date/Time   CHOL 114 07/24/2018 1552   CHOL 127 02/06/2013 0821   TRIG 54 07/24/2018 1552   TRIG 72 05/08/2017 0815   TRIG 59 02/06/2013 0821   HDL 56 07/24/2018 1552   HDL 52 05/08/2017 0815   HDL 48 02/06/2013 0821   CHOLHDL 2.0 07/24/2018 1552   LDLCALC 47 07/24/2018 1552   LDLCALC 76 03/19/2014 0816    LDLCALC 67 02/06/2013 0821    Physical Exam:    VS:  BP 118/60 (BP Location: Right Arm, Patient Position: Sitting, Cuff Size: Normal)   Pulse 71   Ht 5\' 9"  (1.753 m)   Wt 157 lb 12.8 oz (71.6 kg)   SpO2 98%   BMI 23.30 kg/m     Wt Readings from Last 3 Encounters:  08/20/18 157 lb 12.8 oz (71.6 kg)  08/06/18 159 lb (72.1 kg)  06/27/18 166 lb (75.3 kg)     GEN: He seems a little frail stooped posture well nourished, well developed in no acute distress HEENT: Normal NECK: No JVD; No carotid bruits LYMPHATICS: No lymphadenopathy CARDIAC: Irregular rhythm variable first heart sound  no murmurs, rubs, gallops RESPIRATORY:  Clear to auscultation without rales, wheezing or rhonchi  ABDOMEN: Soft, non-tender, non-distended MUSCULOSKELETAL:  No edema; No deformity  SKIN: Warm and dry NEUROLOGIC:  Alert and oriented x 3 PSYCHIATRIC:  Normal affect    Signed, Keith More, MD  08/20/2018 1:00 PM    [facility]

## 2018-08-20 ENCOUNTER — Encounter: Payer: Self-pay | Admitting: Cardiology

## 2018-08-20 ENCOUNTER — Ambulatory Visit (INDEPENDENT_AMBULATORY_CARE_PROVIDER_SITE_OTHER): Payer: Medicare Other | Admitting: Cardiology

## 2018-08-20 ENCOUNTER — Other Ambulatory Visit: Payer: Self-pay | Admitting: Family Medicine

## 2018-08-20 VITALS — BP 118/60 | HR 71 | Ht 69.0 in | Wt 157.8 lb

## 2018-08-20 DIAGNOSIS — E785 Hyperlipidemia, unspecified: Secondary | ICD-10-CM | POA: Diagnosis not present

## 2018-08-20 DIAGNOSIS — Z7901 Long term (current) use of anticoagulants: Secondary | ICD-10-CM

## 2018-08-20 DIAGNOSIS — N183 Chronic kidney disease, stage 3 unspecified: Secondary | ICD-10-CM

## 2018-08-20 DIAGNOSIS — I25118 Atherosclerotic heart disease of native coronary artery with other forms of angina pectoris: Secondary | ICD-10-CM

## 2018-08-20 DIAGNOSIS — I129 Hypertensive chronic kidney disease with stage 1 through stage 4 chronic kidney disease, or unspecified chronic kidney disease: Secondary | ICD-10-CM

## 2018-08-20 DIAGNOSIS — I482 Chronic atrial fibrillation, unspecified: Secondary | ICD-10-CM | POA: Insufficient documentation

## 2018-08-20 DIAGNOSIS — Z01810 Encounter for preprocedural cardiovascular examination: Secondary | ICD-10-CM | POA: Insufficient documentation

## 2018-08-20 NOTE — Patient Instructions (Signed)

## 2018-09-01 ENCOUNTER — Emergency Department (HOSPITAL_COMMUNITY)
Admission: EM | Admit: 2018-09-01 | Discharge: 2018-09-01 | Disposition: A | Discharge status: Home / Self Care | Payer: Medicare Other | Attending: Emergency Medicine | Admitting: Emergency Medicine

## 2018-09-01 ENCOUNTER — Emergency Department (HOSPITAL_COMMUNITY): Payer: Medicare Other

## 2018-09-01 ENCOUNTER — Other Ambulatory Visit: Payer: Self-pay

## 2018-09-01 ENCOUNTER — Encounter (HOSPITAL_COMMUNITY): Payer: Self-pay | Admitting: *Deleted

## 2018-09-01 DIAGNOSIS — I2510 Atherosclerotic heart disease of native coronary artery without angina pectoris: Secondary | ICD-10-CM | POA: Insufficient documentation

## 2018-09-01 DIAGNOSIS — I129 Hypertensive chronic kidney disease with stage 1 through stage 4 chronic kidney disease, or unspecified chronic kidney disease: Secondary | ICD-10-CM | POA: Diagnosis not present

## 2018-09-01 DIAGNOSIS — M5030 Other cervical disc degeneration, unspecified cervical region: Secondary | ICD-10-CM

## 2018-09-01 DIAGNOSIS — I4891 Unspecified atrial fibrillation: Secondary | ICD-10-CM | POA: Diagnosis not present

## 2018-09-01 DIAGNOSIS — N183 Chronic kidney disease, stage 3 (moderate): Secondary | ICD-10-CM | POA: Diagnosis not present

## 2018-09-01 DIAGNOSIS — M25511 Pain in right shoulder: Secondary | ICD-10-CM | POA: Diagnosis not present

## 2018-09-01 DIAGNOSIS — Z7901 Long term (current) use of anticoagulants: Secondary | ICD-10-CM | POA: Insufficient documentation

## 2018-09-01 DIAGNOSIS — M542 Cervicalgia: Secondary | ICD-10-CM

## 2018-09-01 DIAGNOSIS — Z8546 Personal history of malignant neoplasm of prostate: Secondary | ICD-10-CM | POA: Insufficient documentation

## 2018-09-01 DIAGNOSIS — G20 Parkinson's disease: Secondary | ICD-10-CM | POA: Insufficient documentation

## 2018-09-01 DIAGNOSIS — Z79899 Other long term (current) drug therapy: Secondary | ICD-10-CM | POA: Insufficient documentation

## 2018-09-01 DIAGNOSIS — M25512 Pain in left shoulder: Secondary | ICD-10-CM | POA: Insufficient documentation

## 2018-09-01 DIAGNOSIS — M5090 Cervical disc disorder, unspecified, unspecified cervical region: Secondary | ICD-10-CM | POA: Diagnosis not present

## 2018-09-01 LAB — I-STAT TROPONIN, ED: TROPONIN I, POC: 0.01 ng/mL (ref 0.00–0.08)

## 2018-09-01 MED ORDER — HYDROCODONE-ACETAMINOPHEN 5-325 MG PO TABS
1.0000 | ORAL_TABLET | Freq: Once | ORAL | Status: AC
  Administered 2018-09-01: 1 via ORAL
  Filled 2018-09-01: qty 1

## 2018-09-01 MED ORDER — METHOCARBAMOL 500 MG PO TABS: 500.0000 mg | ORAL_TABLET | Freq: Two times a day (BID) | ORAL | 0 refills | Status: DC | PRN

## 2018-09-01 MED ORDER — HYDROCODONE-ACETAMINOPHEN 5-325 MG PO TABS: ORAL_TABLET | ORAL | 0 refills | Status: DC

## 2018-09-01 NOTE — ED Notes (Signed)
Pt in no distress has L arm flexed and over his head  Denies pain at this time

## 2018-09-01 NOTE — Discharge Instructions (Signed)
Take the prescriptions as directed.  Apply moist heat or ice to the area(s) of discomfort, for 15 minutes at a time, several times per day for the next few days.  Do not fall asleep on a heating or ice pack.  Call your regular medical doctor on Monday to schedule a follow up appointment this week.  Return to the Emergency Department immediately if worsening. ° °

## 2018-09-01 NOTE — ED Triage Notes (Signed)
Pt c/o bilateral shoulder and neck  pain that started hurting when pt got up this am, denies any injury, states that the pain is worse with movement,

## 2018-09-01 NOTE — ED Provider Notes (Signed)
[facility] EMERGENCY DEPARTMENT Provider Note   CSN: [id number] Arrival date & time: 09/01/18  1841     History   Chief Complaint Chief Complaint  Patient presents with  . Shoulder Pain    HPI Keith Hill is a 85 y.o. male.  HPI  Pt was seen at [facility]. Per pt and his family, c/o gradual onset and persistence of constant left and right shoulders "pains" since yesterday. Pt also c/o left sided neck "pains" since yesterday. Family states pt has hx of arthritis and neck pain/muscles spasms and questions same as cause of his symptoms today. Pains worsen with palpation and movement of his shoulders. Denies CP/palpitations, no SOB/cough, no abd pain, no N/V/D, no fevers, no rash, no injury, no edema, no focal motor weakness, no tingling/numbness in extremities.     Past Medical History:  Diagnosis Date  . Allergic rhinitis, cause unspecified   . Cardiac dysrhythmia, unspecified   . Colon polyps   . Essential and other specified forms of tremor   . GERD (gastroesophageal reflux disease)   . Hemorrhoids, external   . Hiatal hernia   . Hyperlipemia   . Hyperlipidemia   . Hyperplasia of prostate   . Hypertension   . Lumbar disc disease   . Pancreatitis 1976  . Paralysis agitans ([place]) 11/07/2012  . Parkinson disease ([place])   . Peyronie's disease   . Prostate cancer [facility]    prostate    Patient Active Problem List   Diagnosis Date Noted  . Chronic atrial fibrillation 08/20/2018  . Chronic anticoagulation 08/20/2018  . Preoperative cardiovascular examination 08/20/2018  . Benign hypertension with chronic kidney disease, stage III ([place]) 12/19/2017  . 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 Aortic atherosclerosis ([place]) 10/29/2017  . Spondylosis without myelopathy or radiculopathy, lumbar region 12/05/2016  . Back pain 11/18/2013  . Vitamin D deficiency 08/28/2013  . Hiatal hernia with gastroesophageal reflux  08/28/2013  . CAD (coronary artery disease) 04/08/2013  . Essential and other specified forms of tremor 03/31/2013  . Parkinson disease ([place]) 11/07/2012  . Hemorrhoids 02/17/2011  . Hyperlipidemia with target LDL less than 70   . Prostate cancer ([place])   . Hypertension   . Allergic rhinitis   . Colon polyps, history of     Past Surgical History:  Procedure Laterality Date  . CATARACT EXTRACTION    . EYE SURGERY    . HEMORRHOID SURGERY    . KNEE SURGERY Left   . PROSTATE BIOPSY  04/03/2013  . PROSTATE BIOPSY  11/01/2011  . SHOULDER SURGERY Left         Home Medications    Prior to Admission medications   Medication Sig Start Date End Date Taking? Authorizing Provider  amLODipine (NORVASC) 10 MG tablet TAKE (1/2) TABLET DAILY. 04/30/18  Yes Chipper Herb, MD  atorvastatin (LIPITOR) 20 MG tablet TAKE (1) TABLET DAILY AS DIRECTED. 08/22/18  Yes Chipper Herb, MD  carbidopa-levodopa (SINEMET IR) 25-100 MG tablet TAKE 1&1/2 TABLETS IN THE MORNING AND 1 AT [facility] AND EVENING 03/21/18  Yes Kathrynn Ducking, MD  Cholecalciferol (VITAMIN D3) 5000 UNITS CAPS Take 1 capsule by mouth 4 (four) times a week. Reported on 09/14/2015   Yes [provider]  docusate sodium (COLACE) 100 MG capsule Take 100 mg by mouth daily. daily   Yes [provider]  doxylamine, Sleep, (UNISOM) 25 MG tablet Take 12.5 mg by mouth at bedtime  as needed.   Yes [provider]  ELIQUIS 5 MG TABS tablet TAKE (1) TABLET TWICE A DAY. 07/01/18  Yes Chipper Herb, MD  fluticasone [facility]) 50 MCG/ACT nasal spray Place 1 spray into both nostrils daily.    Yes [provider]  guaiFENesin (MUCINEX) 600 MG 12 hr tablet Take 600 mg by mouth 2 (two) times daily as needed.   Yes [provider]  [name] Oil 300 MG CAPS Take 500 mg by mouth daily. Reported on 09/14/2015   Yes [provider]  Magnesium 250 MG TABS Take 2 tablets by mouth at bedtime as needed.    Yes [provider]  Multiple Vitamins-Minerals (PRESERVISION AREDS 2 PO) Take 1 tablet by mouth daily.    Yes [provider]  rasagiline (AZILECT) 1 MG TABS tablet TAKE 1 TABLET DAILY 11/05/17  Yes Kathrynn Ducking, MD  terazosin (HYTRIN) 2 MG capsule TAKE (1) CAPSULE DAILY AT BEDTIME. 07/09/18  Yes Chipper Herb, MD  valsartan-hydrochlorothiazide (DIOVAN HCT) 160-12.5 MG tablet Take 1 tablet by mouth daily. 01/07/18  Yes Chipper Herb, MD  VIRT-GARD 2.2-25-1 MG TABS TAKE 1 TABLET DAILY 04/25/18  Yes Chipper Herb, MD  metoprolol (LOPRESSOR) 50 MG tablet Take 50 mg by mouth daily.     [provider]    Family History Family History  Problem Relation Age of Onset  . Heart disease Father   . Kidney failure Father   . Alcoholism Brother   . Heart attack Brother   . Hyperlipidemia Son   . Cancer Neg Hx     Social History Social History   Tobacco Use  . Smoking status: Never Smoker  . Smokeless tobacco: Never Used  Substance Use Topics  . Alcohol use: Never    Frequency: Never  . Drug use: Never     Allergies   Aspirin; Aspirin; Calicum glycerophosphate; Niaspan [niacin er]; and Zocor [simvastatin]   Review of Systems Review of Systems ROS: Statement: All systems negative except as marked or noted in the HPI; Constitutional: Negative for fever and chills. ; ; Eyes: Negative for eye pain, redness and discharge. ; ; ENMT: Negative for ear pain, hoarseness, nasal congestion, sinus pressure and sore throat. ; ; Cardiovascular: Negative for chest pain, palpitations, diaphoresis, dyspnea and peripheral edema. ; ; Respiratory: Negative for cough, wheezing and stridor. ; ; Gastrointestinal: Negative for nausea, vomiting, diarrhea, abdominal pain, blood in stool, hematemesis, jaundice and rectal bleeding. . ; ; Genitourinary: Negative for dysuria, flank pain and hematuria. ; ; Musculoskeletal: Negative for back pain. +shoulders and neck pain. Negative for swelling and  trauma.; ; Skin: Negative for pruritus, rash, abrasions, blisters, bruising and skin lesion.; ; Neuro: Negative for headache, lightheadedness and neck stiffness. Negative for weakness, altered level of consciousness, altered mental status, extremity weakness, paresthesias, involuntary movement, seizure and syncope.       Physical Exam Updated Vital Signs BP 123/74 (BP Location: Right Arm)   Pulse 91   Temp 98.1 F (36.7 C) (Oral)   Resp 16   Ht 5' 8.5" (1.74 m)   Wt 73.5 kg   SpO2 97%   BMI 24.27 kg/m   Physical Exam 1935: Physical examination:  Nursing notes reviewed; Vital signs and O2 SAT reviewed;  Constitutional: Well developed, Well nourished, Well hydrated, In no acute distress; Head:  Normocephalic, atraumatic; Eyes: EOMI, PERRL, No scleral icterus; ENMT: Mouth and pharynx normal, Mucous membranes moist; Neck: Supple, Full range of motion,  No lymphadenopathy; Cardiovascular: Irregular rate and rhythm, No gallop; Respiratory: Breath sounds clear & equal bilaterally, No wheezes.  Speaking full sentences with ease, Normal respiratory effort/excursion; Chest: Nontender, Movement normal; Abdomen: Soft, Nontender, Nondistended, Normal bowel sounds; Genitourinary: No CVA tenderness; Spine:  No midline CS, TS, LS tenderness. +TTP left hypertonic trapezius muscle. No rash;; Extremities: Peripheral pulses normal.  Right shoulder w/decreased ROM, +TTP right AC joint. Otherwise left shoulder with FROM, generalized tenderness to palp. Bilateral clavicles NT, scapulas NT, proximal humerus bilat NT, biceps tendons NT over bicipital grooves.  Motor strength at shoulders normal.  Sensation intact over deltoid regions, distal NMS intact with both hands having intact and equal sensation and strength in the distribution of the median, radial, and ulnar nerve function compared to opposite side.  Strong radial pulses.  +FROM bilateral elbows with intact motor strength biceps and triceps muscles to resistance.  NT bilat elbows/wrists/hands. No edema, no erythema. No tenderness, No edema, No calf edema or asymmetry.; Neuro: AA&Ox3, Major CN grossly intact.  Speech clear. No gross focal motor or sensory deficits in extremities.; Skin: Color normal, Warm, Dry.   ED Treatments / Results  Labs (all labs ordered are listed, but only abnormal results are displayed)   EKG EKG Interpretation  Date/Time:  Sunday September 01 2018 19:41:51 EST Ventricular Rate:  86 PR Interval:    QRS Duration: 92 QT Interval:  382 QTC Calculation: 457 R Axis:   57 Text Interpretation:  Atrial fibrillation Low voltage, extremity and precordial leads When compared with ECG of 08/20/2018 No significant change was found Confirmed by Francine Graven [phone]) on 09/01/2018 8:05:39 PM   Radiology   Procedures Procedures (including critical care time)  Medications Ordered in ED Medications  HYDROcodone-acetaminophen (NORCO/VICODIN) 5-325 MG per tablet 1 tablet (1 tablet Oral Given 09/01/18 1943)     Initial Impression / Assessment and Plan / ED Course  I have reviewed the triage vital signs and the nursing notes.  Pertinent labs & imaging results that were available during my care of the patient were reviewed by me and considered in my medical decision making (see chart for details).  MDM Reviewed: previous chart, nursing note and vitals Reviewed previous: labs and ECG Interpretation: labs, ECG, x-ray and CT scan   Results for orders placed or performed during the hospital encounter of 09/01/18  I-stat troponin, ED  Result Value Ref Range   Troponin i, poc 0.01 0.00 - 0.08 ng/mL   Comment 3           Dg Chest 2 View Result Date: 09/01/2018 CLINICAL DATA:  Bilateral shoulder and neck pain since this morning. No injury. EXAM: CHEST - 2 VIEW COMPARISON:  10/29/2017 FINDINGS: Mild cardiac enlargement. Lungs are clear and expanded. No airspace disease or consolidation. No blunting of costophrenic angles. No  pneumothorax. Mediastinal contours appear intact. Calcification of the aorta. Degenerative changes in the spine. IMPRESSION: Mild cardiac enlargement. No evidence of active pulmonary disease. Electronically Signed   By: Lucienne Capers M.D.   On: 09/01/2018 21:35   Dg Shoulder Right Result Date: 09/01/2018 CLINICAL DATA:  Right shoulder pain this morning. No injury. EXAM: RIGHT SHOULDER - 2+ VIEW COMPARISON:  07/30/2014 FINDINGS: Degenerative changes in the glenohumeral joint. No evidence of acute fracture or dislocation. Calcifications in the subacromial region likely to represent calcific tendinosis. This is increasing since previous study. IMPRESSION: Degenerative changes in the right shoulder with calcific tendinosis. No acute bony abnormalities. Electronically Signed   By: Gwyndolyn Saxon  Gerilyn Nestle M.D.   On: 09/01/2018 21:36   Ct Cervical Spine Wo Contrast Result Date: 09/01/2018 CLINICAL DATA:  85 year old acute onset of BILATERAL neck and shoulder pain, RIGHT greater than LEFT, after awakening this morning. Pain is worse with movement. No injury. EXAM: CT CERVICAL SPINE WITHOUT CONTRAST TECHNIQUE: Multidetector CT imaging of the cervical spine was performed without intravenous contrast. Multiplanar CT image reconstructions were also generated. COMPARISON:  No prior cervical spine CT. Cervical spine x-rays 09/12/2015, 08/30/2010. FINDINGS: Alignment: Anatomic posterior alignment. Facet joints anatomically aligned with diffuse degenerative changes. Skull base and vertebrae: No fractures identified involving the cervical spine. Coronal reformatted images demonstrate an intact craniocervical junction, intact dens and intact lateral masses throughout. Soft tissues and spinal canal: No evidence of paraspinous or spinal canal hematoma. No evidence of spinal stenosis. Disc levels: Moderate disc space narrowing at C3-4 and C6-7. Calcification in the POSTERIOR annular fibers at C6-7 indicating a chronic protrusion. No  gross disc extrusion on the soft tissue window images. Facet and uncinate hypertrophy combine to account for multilevel foraminal stenoses including mild RIGHT and moderate LEFT C2-3, moderate BILATERAL C3-4, severe RIGHT and moderate LEFT C4-5, mild BILATERAL C5-6, severe RIGHT and moderate LEFT C6-7 and mild RIGHT C7-T1. Upper chest: Atherosclerosis involving the proximal great vessels. Visualized superior mediastinum otherwise unremarkable. Visualized lung apices clear. Other: Moderate atherosclerosis involving the carotid bulbs bilaterally. IMPRESSION: 1. No cervical spine fractures identified. 2. Multilevel degenerative disc disease, spondylosis and facet degenerative changes with multilevel foraminal stenoses as detailed above. Electronically Signed   By: Evangeline Dakin M.D.   On: 09/01/2018 21:11   Dg Shoulder Left Result Date: 09/01/2018 CLINICAL DATA:  Left shoulder pain beginning this morning. No injury. EXAM: LEFT SHOULDER - 2+ VIEW COMPARISON:  None. FINDINGS: Degenerative changes in the glenohumeral and acromioclavicular joints. No evidence of acute fracture or dislocation. No focal bone lesion or bone destruction. Bone cortex appears intact. Soft tissues are unremarkable. IMPRESSION: Degenerative changes in the left shoulder. No acute bony abnormalities. Electronically Signed   By: Lucienne Capers M.D.   On: 09/01/2018 21:36    2210:  XR/CT reassuring. Pt feels better after pain meds and would like to go home now. Family would like to take pt home now; they are requesting pain med and muscle relaxer. Precautions regarding both meds discussed; pt and family verb understanding. Dx and testing d/w pt and family.  Questions answered.  Verb understanding, agreeable to d/c home with outpt f/u.     Final Clinical Impressions(s) / ED Diagnoses   Final diagnoses:  None    ED Discharge Orders    None       Francine Graven, DO 09/05/18 1150

## 2018-09-01 NOTE — ED Notes (Signed)
In rad

## 2018-09-02 ENCOUNTER — Telehealth: Payer: Self-pay | Admitting: Family Medicine

## 2018-09-02 DIAGNOSIS — G8929 Other chronic pain: Secondary | ICD-10-CM

## 2018-09-02 DIAGNOSIS — M25512 Pain in left shoulder: Secondary | ICD-10-CM

## 2018-09-02 DIAGNOSIS — M5030 Other cervical disc degeneration, unspecified cervical region: Secondary | ICD-10-CM

## 2018-09-02 DIAGNOSIS — M542 Cervicalgia: Secondary | ICD-10-CM

## 2018-09-02 NOTE — Telephone Encounter (Signed)
Pt has already seen ramos = in recent past

## 2018-09-02 NOTE — Telephone Encounter (Signed)
Pt just wanted [place] to know that he went to ER [facility] last night.   Pt thinks that his shoulder pain and arthralgias got worse when he started Eliquis and MVI-AREDS (for eyes).  Do you think either med could cause these side effects?

## 2018-09-02 NOTE — Addendum Note (Signed)
Addended by: Zannie Cove on: 09/02/2018 02:57 PM   Modules accepted: Orders

## 2018-09-02 NOTE — Telephone Encounter (Signed)
I seriously doubt this.  Please make an appointment for him to see Dr. Herma Mering as soon as possible for further follow-up of spondylosis in the neck and arthritis in the shoulder.  Some injections may be helpful at this point.

## 2018-09-02 NOTE — Telephone Encounter (Signed)
About neck or back??  I thought he was seeing Ramos about his back?

## 2018-09-02 NOTE — Telephone Encounter (Signed)
Patient prefers to speak to nurse Roselyn Reef.

## 2018-09-02 NOTE — Telephone Encounter (Signed)
Pt has seen RAMOS for low back - is now willing to see him for neck also - will place referral for this

## 2018-09-02 NOTE — Telephone Encounter (Signed)
Pt suggestion: could he see a neurosurgeon? (he states that he has never been to one)

## 2018-09-03 ENCOUNTER — Telehealth: Payer: Self-pay | Admitting: Family Medicine

## 2018-09-03 NOTE — Telephone Encounter (Signed)
Pt already taking Norco and muscle relaxer.

## 2018-09-03 NOTE — Telephone Encounter (Signed)
Patient was seen at [facility] 1/12 for shoulder and neck pain. Patient states he was given Norco and robaxin and they are not helping with his pain. Would like to know If something else can be called in. Patient aware he will need ER follow up and patient states he would like for Roselyn Reef to call him since she is familiar with what has been going on with his neck and shoulders.

## 2018-09-03 NOTE — Telephone Encounter (Signed)
Pt is wanting something stronger for pain - until he can see Ramos.

## 2018-09-03 NOTE — Telephone Encounter (Signed)
PT is wanting to speak to Roselyn Reef he has terrible pain in neck and head, ER gave him medicine and isn't doing any good.

## 2018-09-03 NOTE — Telephone Encounter (Signed)
Do Ultram 1/2 to 1 pill every 8-12 hours as needed for severe pain, #20.

## 2018-09-04 NOTE — Telephone Encounter (Signed)
Pt came by yesterday and was given ortho appt - for 09/05/18.

## 2018-09-05 ENCOUNTER — Telehealth: Payer: Self-pay | Admitting: Cardiology

## 2018-09-05 DIAGNOSIS — M5030 Other cervical disc degeneration, unspecified cervical region: Secondary | ICD-10-CM | POA: Diagnosis not present

## 2018-09-05 DIAGNOSIS — M5412 Radiculopathy, cervical region: Secondary | ICD-10-CM | POA: Diagnosis not present

## 2018-09-05 DIAGNOSIS — M542 Cervicalgia: Secondary | ICD-10-CM | POA: Diagnosis not present

## 2018-09-05 NOTE — Telephone Encounter (Signed)
Please advise. Thanks.  

## 2018-09-05 NOTE — Telephone Encounter (Signed)
New Message   Pt is at the dentist to have teeth removed and he is on eliquis. They are wondering what to do.  Please call Almyra Free at [facility]  [phone]

## 2018-09-12 DIAGNOSIS — M65332 Trigger finger, left middle finger: Secondary | ICD-10-CM | POA: Diagnosis not present

## 2018-09-26 ENCOUNTER — Telehealth: Payer: Self-pay | Admitting: Family Medicine

## 2018-09-26 ENCOUNTER — Ambulatory Visit (INDEPENDENT_AMBULATORY_CARE_PROVIDER_SITE_OTHER): Payer: Medicare Other | Admitting: Neurology

## 2018-09-26 ENCOUNTER — Encounter: Payer: Self-pay | Admitting: Neurology

## 2018-09-26 ENCOUNTER — Other Ambulatory Visit: Payer: Self-pay

## 2018-09-26 ENCOUNTER — Other Ambulatory Visit: Payer: Self-pay | Admitting: *Deleted

## 2018-09-26 VITALS — BP 108/64 | HR 81 | Resp 18 | Ht 68.5 in | Wt 148.5 lb

## 2018-09-26 DIAGNOSIS — G20 Parkinson's disease: Secondary | ICD-10-CM

## 2018-09-26 DIAGNOSIS — R634 Abnormal weight loss: Secondary | ICD-10-CM

## 2018-09-26 MED ORDER — RASAGILINE MESYLATE 1 MG PO TABS: 1.0000 mg | ORAL_TABLET | Freq: Every day | ORAL | 3 refills | Status: DC

## 2018-09-26 NOTE — Telephone Encounter (Signed)
Spoke with Karn Pickler - aware of all orders - will do these next week and dee DWM the following week.

## 2018-09-26 NOTE — Progress Notes (Signed)
Please discusswith clinical pharmacy the side effects of Eliquis on appetite and if that could be a problem.  I have never heard of this before.

## 2018-09-26 NOTE — Telephone Encounter (Signed)
Seen Dr Jannifer Franklin today:  Weight has been dropping 11/7=166 Lb 12/17=159 Lb  2/6= 148 Lb    Pepper says he has no appetite Thinks it may be the [employer] is very concerned on the wt loss.

## 2018-09-26 NOTE — Telephone Encounter (Signed)
Repeat CBC, BMP, LFTs, thyroid profile, urianalysis and make sure that he has had a chest x-ray in the past 6 months if not do a chest x-ray.  Also, have him return an FOBT. If possible an appointment to be seen early next week.  To review these results

## 2018-09-26 NOTE — Telephone Encounter (Signed)
Please try to speak to [facility] and find out what he needs to talk to me about

## 2018-09-26 NOTE — Progress Notes (Signed)
Reason for visit: Parkinson's disease  Keith Hill is an 85 y.o. male  History of present illness:  Keith Hill is an 85 year old right-handed white male with a history of Parkinson's disease.  Since last seen, the patient has lost almost 16 pounds, this weight loss is unintentional.  The patient reports that he no longer has an appetite, he does not feel queasy or nauseated but he just does not have the sensation that he needs to eat.  The patient has had a long duration problem with loss of sense of taste and smell since his 30s after a viral infection.  The patient believes that this may have worsened since he went on Eliquis.  The patient denies any fevers or chills or any night sweats.  His ability to get up and walk has not changed, he has remained fairly stable, he tries to workout 3 times a week.  The patient denies any falls, he denies issues with swallowing or choking.  The patient remains on Sinemet and Azilect.  Past Medical History:  Diagnosis Date  . Allergic rhinitis, cause unspecified   . Cardiac dysrhythmia, unspecified   . Colon polyps   . Essential and other specified forms of tremor   . GERD (gastroesophageal reflux disease)   . Hemorrhoids, external   . Hiatal hernia   . Hyperlipemia   . Hyperlipidemia   . Hyperplasia of prostate   . Hypertension   . Lumbar disc disease   . Pancreatitis 1976  . Paralysis agitans ([place]) 11/07/2012  . Parkinson disease ([place])   . Peyronie's disease   . Prostate cancer [facility])    prostate    Past Surgical History:  Procedure Laterality Date  . CATARACT EXTRACTION    . EYE SURGERY    . HEMORRHOID SURGERY    . KNEE SURGERY Left   . PROSTATE BIOPSY  04/03/2013  . PROSTATE BIOPSY  11/01/2011  . SHOULDER SURGERY Left     Family History  Problem Relation Age of Onset  . Heart disease Father   . Kidney failure Father   . Alcoholism Brother   . Heart attack Brother   . Hyperlipidemia Son   . Cancer Neg Hx     Social  history:  reports that he has never smoked. He has never used smokeless tobacco. He reports that he does not drink alcohol or use drugs.    Allergies  Allergen Reactions  . Aspirin     Upsets stomach  . Aspirin Nausea And Vomiting  . Calicum Glycerophosphate   . Niaspan [Niacin Er]   . Zocor [Simvastatin]     Medications:  Prior to Admission medications   Medication Sig Start Date End Date Taking? Authorizing Provider  amLODipine (NORVASC) 10 MG tablet TAKE (1/2) TABLET DAILY. 04/30/18  Yes Chipper Herb, MD  atorvastatin (LIPITOR) 20 MG tablet TAKE (1) TABLET DAILY AS DIRECTED. 08/22/18  Yes Chipper Herb, MD  carbidopa-levodopa (SINEMET IR) 25-100 MG tablet TAKE 1&1/2 TABLETS IN THE MORNING AND 1 AT [facility] AND EVENING 03/21/18  Yes Kathrynn Ducking, MD  Cholecalciferol (VITAMIN D3) 5000 UNITS CAPS Take 1 capsule by mouth 4 (four) times a week. Reported on 09/14/2015   Yes [provider]  docusate sodium (COLACE) 100 MG capsule Take 100 mg by mouth daily. daily   Yes [provider]  doxylamine, Sleep, (UNISOM) 25 MG tablet Take 12.5 mg by mouth at bedtime as needed.   Yes [provider]  ELIQUIS 5 MG TABS tablet TAKE (1) TABLET TWICE A DAY. 07/01/18  Yes Chipper Herb, MD  fluticasone [facility]) 50 MCG/ACT nasal spray Place 1 spray into both nostrils daily.    Yes [provider]  guaiFENesin (MUCINEX) 600 MG 12 hr tablet Take 600 mg by mouth 2 (two) times daily as needed.   Yes [provider]  HYDROcodone-acetaminophen (NORCO/VICODIN) 5-325 MG tablet 1 tab PO q12 hours prn pain 09/01/18  Yes Francine Graven, DO  Krill Oil 300 MG CAPS Take 500 mg by mouth daily. Reported on 09/14/2015   Yes [provider]  Magnesium 250 MG TABS Take 2 tablets by mouth at bedtime as needed.    Yes [provider]  methocarbamol (ROBAXIN) 500 MG tablet Take 1 tablet (500 mg total) by mouth 2 (two) times daily as needed for muscle spasms.  09/01/18  Yes Francine Graven, DO  metoprolol (LOPRESSOR) 50 MG tablet Take 50 mg by mouth daily.    Yes [provider]  Multiple Vitamins-Minerals (PRESERVISION AREDS 2 PO) Take 1 tablet by mouth daily.    Yes [provider]  rasagiline (AZILECT) 1 MG TABS tablet TAKE 1 TABLET DAILY 11/05/17  Yes Kathrynn Ducking, MD  terazosin (HYTRIN) 2 MG capsule TAKE (1) CAPSULE DAILY AT BEDTIME. 07/09/18  Yes Chipper Herb, MD  valsartan-hydrochlorothiazide (DIOVAN HCT) 160-12.5 MG tablet Take 1 tablet by mouth daily. 01/07/18  Yes Chipper Herb, MD  VIRT-GARD 2.2-25-1 MG TABS TAKE 1 TABLET DAILY 04/25/18  Yes Chipper Herb, MD    ROS:  Out of a complete 14 system review of symptoms, the patient complains only of the following symptoms, and all other reviewed systems are negative.  Weight loss  Blood pressure 108/64, pulse 81, resp. rate 18, height 5' 8.5" (1.74 m), weight 148 lb 8 oz (67.4 kg).  Physical Exam  General: The patient is alert and cooperative at the time of the examination.  Skin: No significant peripheral edema is noted.   Neurologic Exam  Mental status: The patient is alert and oriented x 3 at the time of the examination. The patient has apparent normal recent and remote memory, with an apparently normal attention span and concentration ability.   Cranial nerves: Facial symmetry is present. Speech is normal, no aphasia or dysarthria is noted. Extraocular movements are full. Visual fields are full.  Motor: The patient has good strength in all 4 extremities.  Sensory examination: Soft touch sensation is symmetric on the face, arms, and legs.  Coordination: The patient has good finger-nose-finger and heel-to-shin bilaterally.  Resting tremors are noted on the right arm on occasion.  Gait and station: The patient has the ability to walk independently, he has a stooped posture, decreased arm swing with the right arm, tremor with the right arm is  seen.  Reflexes: Deep tendon reflexes are symmetric.   Assessment/Plan:  1.  Parkinson's disease  2.  Unintentional weight loss  The patient may be seeing his primary care physician for the weight loss issue in the near future.  The patient will remain on his current dose of Azilect and Sinemet, he seems to be maintaining his mobility level quite well.  He will follow-up through this office in 6 months.  Jill Alexanders MD 09/26/2018 4:00 PM  [facility] [address] [place], [place] [address]  Phone [phone] Fax [phone]

## 2018-09-30 ENCOUNTER — Other Ambulatory Visit: Payer: Medicare Other

## 2018-09-30 DIAGNOSIS — R634 Abnormal weight loss: Secondary | ICD-10-CM

## 2018-09-30 DIAGNOSIS — M549 Dorsalgia, unspecified: Secondary | ICD-10-CM | POA: Diagnosis not present

## 2018-09-30 LAB — URINALYSIS, COMPLETE
Bilirubin, UA: NEGATIVE
Glucose, UA: NEGATIVE
KETONES UA: NEGATIVE
Leukocytes, UA: NEGATIVE
Nitrite, UA: NEGATIVE
Protein, UA: NEGATIVE
Specific Gravity, UA: 1.025 (ref 1.005–1.030)
Urobilinogen, Ur: 0.2 mg/dL (ref 0.2–1.0)
pH, UA: 5.5 (ref 5.0–7.5)

## 2018-09-30 LAB — MICROSCOPIC EXAMINATION
Bacteria, UA: NONE SEEN
Epithelial Cells (non renal): NONE SEEN /hpf (ref 0–10)
Renal Epithel, UA: NONE SEEN /hpf
WBC, UA: NONE SEEN /hpf (ref 0–5)

## 2018-09-30 NOTE — Telephone Encounter (Signed)
Almyra Free from [facility] called to see if patient should hold Eliquis prior to having teeth pulled.  Almyra Free advised per Dr Joya Gaskins verbal order that generally patient does not have to hold Eliquis prior to having teeth held, but if the dentist at the practice prefers to hold blood thinner then Eliquis can be held one day prior and one day after teeth extraction.  Almyra Free verbalized understanding and agreed to plan.

## 2018-10-01 ENCOUNTER — Ambulatory Visit (INDEPENDENT_AMBULATORY_CARE_PROVIDER_SITE_OTHER): Payer: Medicare Other

## 2018-10-01 ENCOUNTER — Other Ambulatory Visit: Payer: Medicare Other

## 2018-10-01 DIAGNOSIS — Z1211 Encounter for screening for malignant neoplasm of colon: Secondary | ICD-10-CM

## 2018-10-01 DIAGNOSIS — R634 Abnormal weight loss: Secondary | ICD-10-CM

## 2018-10-01 LAB — BMP8+EGFR
BUN/Creatinine Ratio: 22 (ref 10–24)
BUN: 33 mg/dL — ABNORMAL HIGH (ref 8–27)
CALCIUM: 8.9 mg/dL (ref 8.6–10.2)
CO2: 24 mmol/L (ref 20–29)
Chloride: 100 mmol/L (ref 96–106)
Creatinine, Ser: 1.5 mg/dL — ABNORMAL HIGH (ref 0.76–1.27)
GFR calc Af Amer: 48 mL/min/{1.73_m2} — ABNORMAL LOW (ref >59)
GFR calc non Af Amer: 42 mL/min/{1.73_m2} — ABNORMAL LOW (ref >59)
Glucose: 84 mg/dL (ref 65–99)
Potassium: 3.8 mmol/L (ref 3.5–5.2)
Sodium: 139 mmol/L (ref 134–144)

## 2018-10-01 LAB — HEPATIC FUNCTION PANEL
ALT: 25 IU/L (ref 0–44)
AST: 32 IU/L (ref 0–40)
Albumin: 4 g/dL (ref 3.6–4.6)
Alkaline Phosphatase: 106 IU/L (ref 39–117)
Bilirubin Total: 0.5 mg/dL (ref 0.0–1.2)
Bilirubin, Direct: 0.19 mg/dL (ref 0.00–0.40)
Total Protein: 6.7 g/dL (ref 6.0–8.5)

## 2018-10-01 LAB — CBC WITH DIFFERENTIAL/PLATELET
Basophils Absolute: 0 10*3/uL (ref 0.0–0.2)
Basos: 0 %
EOS (ABSOLUTE): 0.3 10*3/uL (ref 0.0–0.4)
Eos: 4 %
HEMOGLOBIN: 12.1 g/dL — AB (ref 13.0–17.7)
Hematocrit: 33.7 % — ABNORMAL LOW (ref 37.5–51.0)
Immature Grans (Abs): 0 10*3/uL (ref 0.0–0.1)
Immature Granulocytes: 0 %
Lymphocytes Absolute: 1.5 10*3/uL (ref 0.7–3.1)
Lymphs: 21 %
MCH: 35.4 pg — ABNORMAL HIGH (ref 26.6–33.0)
MCHC: 35.9 g/dL — ABNORMAL HIGH (ref 31.5–35.7)
MCV: 99 fL — ABNORMAL HIGH (ref 79–97)
Monocytes Absolute: 1 10*3/uL — ABNORMAL HIGH (ref 0.1–0.9)
Monocytes: 15 %
Neutrophils Absolute: 4.3 10*3/uL (ref 1.4–7.0)
Neutrophils: 60 %
Platelets: 176 10*3/uL (ref 150–450)
RBC: 3.42 x10E6/uL — ABNORMAL LOW (ref 4.14–5.80)
RDW: 12.3 % (ref 11.6–15.4)
WBC: 7.2 10*3/uL (ref 3.4–10.8)

## 2018-10-01 LAB — URINE CULTURE: Organism ID, Bacteria: NO GROWTH

## 2018-10-01 LAB — THYROID PANEL WITH TSH
Free Thyroxine Index: 1.7 (ref 1.2–4.9)
T3 Uptake Ratio: 26 % (ref 24–39)
T4, Total: 6.5 ug/dL (ref 4.5–12.0)
TSH: 4.27 u[IU]/mL (ref 0.450–4.500)

## 2018-10-02 LAB — FECAL OCCULT BLOOD, IMMUNOCHEMICAL: Fecal Occult Bld: NEGATIVE

## 2018-10-09 ENCOUNTER — Ambulatory Visit (INDEPENDENT_AMBULATORY_CARE_PROVIDER_SITE_OTHER): Payer: Medicare Other | Admitting: Family Medicine

## 2018-10-09 ENCOUNTER — Encounter: Payer: Self-pay | Admitting: Family Medicine

## 2018-10-09 VITALS — BP 86/48 | HR 82 | Temp 96.8°F | Ht 68.5 in | Wt 148.0 lb

## 2018-10-09 DIAGNOSIS — G20 Parkinson's disease: Secondary | ICD-10-CM | POA: Diagnosis not present

## 2018-10-09 DIAGNOSIS — I950 Idiopathic hypotension: Secondary | ICD-10-CM

## 2018-10-09 DIAGNOSIS — I4891 Unspecified atrial fibrillation: Secondary | ICD-10-CM | POA: Diagnosis not present

## 2018-10-09 DIAGNOSIS — R634 Abnormal weight loss: Secondary | ICD-10-CM | POA: Diagnosis not present

## 2018-10-09 NOTE — Addendum Note (Signed)
Addended by: Zannie Cove on: 10/09/2018 03:33 PM   Modules accepted: Orders

## 2018-10-09 NOTE — Patient Instructions (Addendum)
Patient will bring medicines in for review and we will make changes once we have reviewed these. He says that he is taking valsartan 100/12.51 daily.  He is taking Hytrin 2 mg at bedtime.  He is taking metoprolol 50 once a day.  He is taking amlodipine 10 mg one half daily. We will confirm this with him as soon as he brings the medicines back and then make changes at that time. We will schedule the patient to visit with the clinical pharmacist in one 1 week because of polypharmacy and bring blood pressure readings back at that time to review with the pharmacist after changes that are made with decreasing and discontinuing some of the medicines today.

## 2018-10-09 NOTE — Addendum Note (Signed)
Addended by: Zannie Cove on: 10/09/2018 12:10 PM   Modules accepted: Orders

## 2018-10-09 NOTE — Progress Notes (Signed)
Subjective:    Patient ID: Keith Hill, male    DOB: [birth year], 85 y.o.   MRN: [id number]  HPI Patient here today for weight loss, decreased appetite and constipation. He is accompanied today by his son.  The patient's son has been notified regarding all of the recent blood work.  Everything was basically good other than he continues to run a slightly decreased hemoglobin.  His creatinine also is slightly elevated at 1.50.  And was clear of infection.  The vital signs today do have a low blood pressure.  He is not taking any medication that would lower the blood pressure.  The patient does see the urologist regularly as he has a history of prostate cancer.  Lab work was reviewed and other than a slightly elevated creatinine that is stable and hemoglobin that is slightly decreased that is stable there were no abnormalities noted.  The patient did have a chest x-ray which was considered normal.  After talking to the patient, I feel that his lightheadedness and weakness are coming from too much antihypertensive medicine.  He is going to bring all of his medicines with the correct information down here for [place] to review and then we can adjust the medication from there on.  The pressures by the nurse were low.  I rechecked the blood pressure with the manual cuff and got 96/56.  This was in the right arm sitting with a regular cuff.   Patient Active Problem List   Diagnosis Date Noted  . Chronic atrial fibrillation 08/20/2018  . Chronic anticoagulation 08/20/2018  . Preoperative cardiovascular examination 08/20/2018  . Benign hypertension with chronic kidney disease, stage III ([place]) 12/19/2017  . 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 Aortic atherosclerosis ([place]) 10/29/2017  . Spondylosis without myelopathy or radiculopathy, lumbar region 12/05/2016  . Back pain 11/18/2013  . Vitamin D deficiency 08/28/2013  . Hiatal hernia with  gastroesophageal reflux 08/28/2013  . CAD (coronary artery disease) 04/08/2013  . Essential and other specified forms of tremor 03/31/2013  . Parkinson disease ([place]) 11/07/2012  . Hemorrhoids 02/17/2011  . Hyperlipidemia with target LDL less than 70   . Prostate cancer ([place])   . Hypertension   . Allergic rhinitis   . Colon polyps, history of    Outpatient Encounter Medications as of 10/09/2018  Medication Sig  . amLODipine (NORVASC) 10 MG tablet TAKE (1/2) TABLET DAILY.  Marland Kitchen atorvastatin (LIPITOR) 20 MG tablet TAKE (1) TABLET DAILY AS DIRECTED.  . carbidopa-levodopa (SINEMET IR) 25-100 MG tablet TAKE 1&1/2 TABLETS IN THE MORNING AND 1 AT NOON AND EVENING  . Cholecalciferol (VITAMIN D3) 5000 UNITS CAPS Take 1 capsule by mouth 4 (four) times a week. Reported on 09/14/2015  . docusate sodium (COLACE) 100 MG capsule Take 100 mg by mouth daily. daily  . doxylamine, Sleep, (UNISOM) 25 MG tablet Take 12.5 mg by mouth at bedtime as needed.  Marland Kitchen ELIQUIS 5 MG TABS tablet TAKE (1) TABLET TWICE A DAY.  . fluticasone (FLONASE) 50 MCG/ACT nasal spray Place 1 spray into both nostrils daily.   Marland Kitchen guaiFENesin (MUCINEX) 600 MG 12 hr tablet Take 600 mg by mouth 2 (two) times daily as needed.  Marland Kitchen HYDROcodone-acetaminophen (NORCO/VICODIN) 5-325 MG tablet 1 tab PO q12 hours prn pain  . Krill Oil 300 MG CAPS Take 500 mg by mouth daily. Reported on 09/14/2015  . Magnesium 250 MG TABS Take 2 tablets by mouth at bedtime  as needed.   . methocarbamol (ROBAXIN) 500 MG tablet Take 1 tablet (500 mg total) by mouth 2 (two) times daily as needed for muscle spasms.  . metoprolol (LOPRESSOR) 50 MG tablet Take 50 mg by mouth daily.   . Multiple Vitamins-Minerals (PRESERVISION AREDS 2 PO) Take 1 tablet by mouth daily.   . rasagiline (AZILECT) 1 MG TABS tablet Take 1 tablet (1 mg total) by mouth daily.  Marland Kitchen terazosin (HYTRIN) 2 MG capsule TAKE (1) CAPSULE DAILY AT BEDTIME.  . valsartan-hydrochlorothiazide (DIOVAN HCT) 160-12.5 MG  tablet Take 1 tablet by mouth daily.  Marland Kitchen VIRT-GARD 2.2-25-1 MG TABS TAKE 1 TABLET DAILY   No facility-administered encounter medications on file as of 10/09/2018.       Review of Systems  Constitutional: Positive for appetite change (decreased ) and unexpected weight change (decreased ).  HENT: Negative.        Dry mouth   Eyes: Negative.   Respiratory: Negative.   Cardiovascular: Negative.   Gastrointestinal: Positive for constipation.  Endocrine: Negative.   Genitourinary: Negative.   Musculoskeletal: Negative.   Skin: Negative.   Allergic/Immunologic: Negative.   Neurological: Negative.   Hematological: Negative.   Psychiatric/Behavioral: Negative.        Objective:   Physical Exam Vitals signs and nursing note reviewed.  Constitutional:      General: He is not in acute distress.    Appearance: He is well-developed and normal weight.     Comments: Patient continues to feel poorly and blood pressures were lower today than in the past.  HENT:     Head: Normocephalic and atraumatic.     Nose: Nose normal. No congestion.  Eyes:     General: No scleral icterus.       Right eye: No discharge.        Left eye: No discharge.     Extraocular Movements: Extraocular movements intact.     Conjunctiva/sclera: Conjunctivae normal.     Pupils: Pupils are equal, round, and reactive to light.  Neck:     Musculoskeletal: Normal range of motion and neck supple.     Thyroid: No thyromegaly.     Vascular: No carotid bruit.     Trachea: No tracheal deviation.  Cardiovascular:     Rate and Rhythm: Normal rate. Rhythm irregular.     Heart sounds: Normal heart sounds. No murmur.     Comments: The heart was slightly irregular today at 72/min Pulmonary:     Effort: Pulmonary effort is normal. No respiratory distress.     Breath sounds: Normal breath sounds. No wheezing or rales.  Abdominal:     General: Abdomen is flat. Bowel sounds are normal.     Palpations: Abdomen is soft. There is  no mass.     Tenderness: There is no abdominal tenderness. There is no guarding.  Musculoskeletal: Normal range of motion.     Right lower leg: No edema.     Left lower leg: No edema.  Lymphadenopathy:     Cervical: No cervical adenopathy.  Skin:    General: Skin is warm and dry.     Findings: No rash.  Neurological:     Mental Status: He is alert and oriented to person, place, and time. Mental status is at baseline.     Cranial Nerves: No cranial nerve deficit.     Motor: Weakness present.     Gait: Gait abnormal.     Deep Tendon Reflexes: Reflexes are normal and symmetric.  Psychiatric:        Mood and Affect: Mood normal.        Behavior: Behavior normal.        Thought Content: Thought content normal.        Judgment: Judgment normal.     Comments: Patient's mood affect and behavior were frustrating because of his weakness.     BP (!) 75/41 (BP Location: Left Arm)   Pulse 82   Temp (!) 96.8 F (36 C) (Oral)   Ht 5' 8.5" (1.74 m)   Wt 148 lb (67.1 kg)   BMI 22.18 kg/m        Assessment & Plan:  1. Idiopathic hypotension -Probably secondary to too much medicine.  2. Weight loss -No reasons for weight loss were found with recent lab work.  3. Parkinson's -Patient will continue with follow-ups with the neurologist.  4. Atrial fibrillation, unspecified type [facility]) -Since his cardiologist has left because of health reasons we will schedule him to see Dr. Percival Spanish here instead.  Patient Instructions  Patient will bring medicines in for review and we will make changes once we have reviewed these. He says that he is taking valsartan 100/12.51 daily.  He is taking Hytrin 2 mg at bedtime.  He is taking metoprolol 50 once a day.  He is taking amlodipine 10 mg one half daily. We will confirm this with him as soon as he brings the medicines back and then make changes at that time. We will schedule the patient to visit with the clinical pharmacist in one 1 week because of  polypharmacy and bring blood pressure readings back at that time to review with the pharmacist after changes that are made with decreasing and discontinuing some of the medicines today.  Arrie Senate MD

## 2018-10-16 ENCOUNTER — Encounter: Payer: Self-pay | Admitting: Family Medicine

## 2018-10-16 ENCOUNTER — Ambulatory Visit (INDEPENDENT_AMBULATORY_CARE_PROVIDER_SITE_OTHER): Payer: Medicare Other | Admitting: Family Medicine

## 2018-10-16 VITALS — BP 95/51 | HR 79 | Temp 97.0°F | Ht 68.5 in | Wt 152.0 lb

## 2018-10-16 DIAGNOSIS — G20 Parkinson's disease: Secondary | ICD-10-CM

## 2018-10-16 DIAGNOSIS — R5383 Other fatigue: Secondary | ICD-10-CM | POA: Diagnosis not present

## 2018-10-16 DIAGNOSIS — Z79899 Other long term (current) drug therapy: Secondary | ICD-10-CM

## 2018-10-16 DIAGNOSIS — R5381 Other malaise: Secondary | ICD-10-CM

## 2018-10-16 DIAGNOSIS — I950 Idiopathic hypotension: Secondary | ICD-10-CM | POA: Diagnosis not present

## 2018-10-16 NOTE — Progress Notes (Signed)
Subjective:    Patient ID: Keith Hill, male    DOB: 1932/03/30, 85 y.o.   MRN: [id number]  HPI Patient here today for 1 week follow up on HTN and weight loss.  The patient is feeling better.  He brings readings from home which are more in the normal range for being at home and not so low.  The reading here was actually still somewhat decreased and I will recheck it again with a manual cuff.  His weight is up about 3 pounds.  His heart rate is good at 79.  At the last visit, the amlodipine was discontinued completely.  The valsartan HCT was discontinued completely.  He is only taking metoprolol now.  The patient says he is feeling better and has more energy and has been drinking some Ensure.  The son who comes with him today confirms this. We did discuss his bowel movements and he is currently using Dulcolax.  We told him that may be trying MiraLAX every day or every other day might be better for him and adding a Colace to this if needed.  We did emphasize that drinking plenty of water is important.  He denies any chest pain shortness of breath intestinal problems or any trouble passing his water.    Patient Active Problem List   Diagnosis Date Noted  . Chronic atrial fibrillation 08/20/2018  . Chronic anticoagulation 08/20/2018  . Preoperative cardiovascular examination 08/20/2018  . Benign hypertension with chronic kidney disease, stage III ([place]) 12/19/2017  . 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 Aortic atherosclerosis ([place]) 10/29/2017  . Spondylosis without myelopathy or radiculopathy, lumbar region 12/05/2016  . Back pain 11/18/2013  . Vitamin D deficiency 08/28/2013  . Hiatal hernia with gastroesophageal reflux 08/28/2013  . CAD (coronary artery disease) 04/08/2013  . Essential and other specified forms of tremor 03/31/2013  . Parkinson disease ([place]) 11/07/2012  . Hemorrhoids 02/17/2011  . Hyperlipidemia with target  LDL less than 70   . Prostate cancer ([place])   . Hypertension   . Allergic rhinitis   . Colon polyps, history of    Outpatient Encounter Medications as of 10/16/2018  Medication Sig  . acetaminophen (TYLENOL) 500 MG tablet Take 500 mg by mouth every 6 (six) hours as needed.  Marland Kitchen atorvastatin (LIPITOR) 20 MG tablet TAKE (1) TABLET DAILY AS DIRECTED.  . carbidopa-levodopa (SINEMET IR) 25-100 MG tablet TAKE 1&1/2 TABLETS IN THE MORNING AND 1 AT NOON AND EVENING  . Cholecalciferol (VITAMIN D3) 5000 UNITS CAPS Take 1 capsule by mouth 4 (four) times a week. Reported on 09/14/2015  . docusate sodium (COLACE) 100 MG capsule Take 100 mg by mouth daily. daily  . doxylamine, Sleep, (UNISOM) 25 MG tablet Take 12.5 mg by mouth at bedtime as needed.  Marland Kitchen ELIQUIS 5 MG TABS tablet TAKE (1) TABLET TWICE A DAY.  Marland Kitchen guaiFENesin (MUCINEX) 600 MG 12 hr tablet Take 600 mg by mouth 2 (two) times daily as needed.  [name] Oil 300 MG CAPS Take 500 mg by mouth daily. Reported on 09/14/2015  . Magnesium 250 MG TABS Take 2 tablets by mouth at bedtime as needed.   . metoprolol (LOPRESSOR) 50 MG tablet Take 50 mg by mouth daily.   . Multiple Vitamins-Minerals (PRESERVISION AREDS 2 PO) Take 1 tablet by mouth daily.   Marland Kitchen NASAL SALINE NA Place into the nose. [employer] brand  . rasagiline (AZILECT) 1 MG TABS tablet Take  1 tablet (1 mg total) by mouth daily.  Marland Kitchen terazosin (HYTRIN) 2 MG capsule TAKE (1) CAPSULE DAILY AT BEDTIME.  Marland Kitchen VIRT-GARD 2.2-25-1 MG TABS TAKE 1 TABLET DAILY  . [DISCONTINUED] amLODipine (NORVASC) 10 MG tablet TAKE (1/2) TABLET DAILY.  . [DISCONTINUED] valsartan-hydrochlorothiazide (DIOVAN HCT) 160-12.5 MG tablet Take 1 tablet by mouth daily.   No facility-administered encounter medications on file as of 10/16/2018.      Review of Systems  Constitutional: Negative.        Appetite still gone - is trying to eat more, drinking 1 ensure daily. Taste still gone   HENT: Negative.   Eyes: Negative.   Respiratory:  Negative.   Cardiovascular: Negative.   Gastrointestinal: Negative.   Endocrine: Negative.   Genitourinary: Negative.   Musculoskeletal: Negative.   Skin: Negative.   Allergic/Immunologic: Negative.   Neurological: Negative.   Hematological: Negative.   Psychiatric/Behavioral: Negative.        Objective:   Physical Exam Vitals signs and nursing note reviewed.  Constitutional:      General: He is not in acute distress.    Appearance: Normal appearance. He is well-developed and normal weight.     Comments: Patient looks better and seems to feel better today even though my blood pressure was still low on him it was better than it was previously.  He comes to the visit today with his son.  HENT:     Head: Normocephalic and atraumatic.     Right Ear: External ear normal.     Left Ear: External ear normal.     Nose: Nose normal.  Eyes:     General: No scleral icterus.       Right eye: No discharge.        Left eye: No discharge.     Extraocular Movements: Extraocular movements intact.     Conjunctiva/sclera: Conjunctivae normal.     Pupils: Pupils are equal, round, and reactive to light.  Neck:     Musculoskeletal: Normal range of motion.     Thyroid: No thyromegaly.     Trachea: No tracheal deviation.  Cardiovascular:     Rate and Rhythm: Normal rate and regular rhythm.     Heart sounds: Normal heart sounds. No murmur.  Pulmonary:     Effort: Pulmonary effort is normal.     Breath sounds: Normal breath sounds. No wheezing or rales.  Genitourinary:    Rectum: Normal.  Musculoskeletal: Normal range of motion.        General: No tenderness.     Right lower leg: No edema.     Left lower leg: No edema.  Skin:    General: Skin is warm and dry.     Findings: No rash.  Neurological:     General: No focal deficit present.     Mental Status: He is alert and oriented to person, place, and time. Mental status is at baseline.     Deep Tendon Reflexes: Reflexes are normal and  symmetric.  Psychiatric:        Mood and Affect: Mood normal.        Behavior: Behavior normal.        Thought Content: Thought content normal.        Judgment: Judgment normal.     Comments: Mood affect and behavior are more upbeat today and son who is with him agrees with that.    BP (!) 95/51 (BP Location: Left Arm)   Pulse 79   Temp Marland Kitchen)  97 F (36.1 C) (Oral)   Ht 5' 8.5" (1.74 m)   Wt 152 lb (68.9 kg)   BMI 22.78 kg/m         Assessment & Plan:  1. Malaise and fatigue -This seems to be improved.  We will continue on the current course and have him meet with the clinical pharmacist to make sure that there is no other issues involving his medicines that could be making him feel bad.  The only medicine he is currently taking for his blood pressure is his metoprolol.  He is off of valsartan HCT and off of amlodipine 5.  2. Parkinson's -Continue with Parkinson's meds and follow-up with neurology as planned  3. Idiopathic hypotension -Blood pressure still running low but not as low as they were previously.  Patient will continue to monitor readings at home and bring these in for review.  4. Polypharmacy -Meet with clinical pharmacy next week  Patient Instructions  Continue with current treatment regimen Continue to watch sodium intake Keep appointment with clinical pharmacy next week to discuss polypharmacy issues and any particular side effects they may have on making you feel more tired and fatigued Stay active as physically possible always being careful not to fall You can use MiraLAX daily or every other day if needed for bowel movements. Potentially you could add Colace to the daily MiraLAX if that is needed for bowel movements. If the bowel movements get too frequent with the MiraLAX reduce it to every other day.  Arrie Senate MD

## 2018-10-16 NOTE — Patient Instructions (Signed)
Continue with current treatment regimen Continue to watch sodium intake Keep appointment with clinical pharmacy next week to discuss polypharmacy issues and any particular side effects they may have on making you feel more tired and fatigued Stay active as physically possible always being careful not to fall You can use MiraLAX daily or every other day if needed for bowel movements. Potentially you could add Colace to the daily MiraLAX if that is needed for bowel movements. If the bowel movements get too frequent with the MiraLAX reduce it to every other day.

## 2018-10-23 ENCOUNTER — Ambulatory Visit: Payer: Medicare Other | Admitting: Pharmacist Clinician (PhC)/ Clinical Pharmacy Specialist

## 2018-10-25 ENCOUNTER — Telehealth: Payer: Self-pay | Admitting: Cardiology

## 2018-10-25 NOTE — Telephone Encounter (Signed)
OK for me.  He is a [employer] patient.  Does he live in [place] because I could also see him up there.

## 2018-10-25 NOTE — Telephone Encounter (Signed)
Yes

## 2018-10-25 NOTE — Telephone Encounter (Signed)
Patient would like to switch providers as the [facility] office is close for him.  Are you both in agreement?

## 2018-10-31 ENCOUNTER — Encounter: Payer: Self-pay | Admitting: Cardiology

## 2018-10-31 NOTE — Progress Notes (Deleted)
Cardiology Office Note   Date:  10/31/2018   ID:  Keith Hill, DOB 1932-02-19, MRN [id number]  PCP:  Chipper Herb, MD  Cardiologist:   No primary care provider on file. Referring:  ***   No chief complaint on file.     History of Present Illness: Keith Hill is a 85 y.o. male who presents for follow up of CAD, hypertension and hyperlipidemia.  ***    last seen 1 year ago Dr. Wynonia Lawman. He is seen today fpr preoperative evaluation for hand surgery trigger finger. Compliance with diet, lifestyle and medications: Yes  He is here today because of impending hand surgery trigger finger outpatient.  He is on chronic anticoagulation with atrial fibrillation and we will have him hold his anticoagulant 2 full days 4 doses pre-and 1 day to full doses post procedure.  His atrial fibrillation is rate controlled asymptomatic.  Coronary artery disease is New York Heart Association class I he has had no anginal discomfort normal exercise tolerance he continues to golf and does not require preoperative ischemia evaluation.  His hypertension is stable and will continue current treatment including long-acting calcium channel blocker beta-blocker and ARB diuretic.  Dyslipidemia stable LDL is at target and he will continue his high intensity statin.  I will plan to see him back in the office in 1 year or sooner if he is having cardiovascular symptoms.  Prior to this visit I reviewed Dr. Thurman Coyer office records correspondence sent to his hand surgeon instructions given to the patient in writing regarding anticoagulation perioperatively   Past Medical History:  Diagnosis Date  . Allergic rhinitis, cause unspecified   . Cardiac dysrhythmia, unspecified   . Colon polyps   . Essential and other specified forms of tremor   . GERD (gastroesophageal reflux disease)   . Hemorrhoids, external   . Hiatal hernia   . Hyperlipemia   . Hyperlipidemia   . Hyperplasia of prostate   . Hypertension   . Lumbar  disc disease   . Pancreatitis 1976  . Paralysis agitans ([place]) 11/07/2012  . Parkinson disease ([place])   . Peyronie's disease   . Prostate cancer [facility])    prostate    Past Surgical History:  Procedure Laterality Date  . CATARACT EXTRACTION    . EYE SURGERY    . HEMORRHOID SURGERY    . KNEE SURGERY Left   . PROSTATE BIOPSY  04/03/2013  . PROSTATE BIOPSY  11/01/2011  . SHOULDER SURGERY Left      Current Outpatient Medications  Medication Sig Dispense Refill  . acetaminophen (TYLENOL) 500 MG tablet Take 500 mg by mouth every 6 (six) hours as needed.    Marland Kitchen atorvastatin (LIPITOR) 20 MG tablet TAKE (1) TABLET DAILY AS DIRECTED. 90 tablet 1  . carbidopa-levodopa (SINEMET IR) 25-100 MG tablet TAKE 1&1/2 TABLETS IN THE MORNING AND 1 AT NOON AND EVENING 315 tablet 3  . Cholecalciferol (VITAMIN D3) 5000 UNITS CAPS Take 1 capsule by mouth 4 (four) times a week. Reported on 09/14/2015    . docusate sodium (COLACE) 100 MG capsule Take 100 mg by mouth daily. daily    . doxylamine, Sleep, (UNISOM) 25 MG tablet Take 12.5 mg by mouth at bedtime as needed.    Marland Kitchen ELIQUIS 5 MG TABS tablet TAKE (1) TABLET TWICE A DAY. 60 tablet 6  . guaiFENesin (MUCINEX) 600 MG 12 hr tablet Take 600 mg by mouth 2 (two) times daily as needed.    Marland Kitchen  Krill Oil 300 MG CAPS Take 500 mg by mouth daily. Reported on 09/14/2015    . Magnesium 250 MG TABS Take 2 tablets by mouth at bedtime as needed.     . metoprolol (LOPRESSOR) 50 MG tablet Take 50 mg by mouth daily.     . Multiple Vitamins-Minerals (PRESERVISION AREDS 2 PO) Take 1 tablet by mouth daily.     Marland Kitchen NASAL SALINE NA Place into the nose. [facility] brand    . rasagiline (AZILECT) 1 MG TABS tablet Take 1 tablet (1 mg total) by mouth daily. 90 tablet 3  . terazosin (HYTRIN) 2 MG capsule TAKE (1) CAPSULE DAILY AT BEDTIME. 90 capsule 1  . VIRT-GARD 2.2-25-1 MG TABS TAKE 1 TABLET DAILY 90 each 1   No current facility-administered medications for this visit.     Allergies:   Aspirin;  Aspirin; Calicum glycerophosphate; Niaspan [niacin er]; and Zocor [simvastatin]    ROS:  Please see the history of present illness.   Otherwise, review of systems are positive for {NONE DEFAULTED:18576::"none"}.   All other systems are reviewed and negative.    PHYSICAL EXAM: VS:  There were no vitals taken for this visit. , BMI There is no height or weight on file to calculate BMI. GENERAL:  Well appearing NECK:  No jugular venous distention, waveform within normal limits, carotid upstroke brisk and symmetric, no bruits, no thyromegaly LUNGS:  Clear to auscultation bilaterally CHEST:  Unremarkable HEART:  PMI not displaced or sustained,S1 and S2 within normal limits, no S3, no S4, no clicks, no rubs, *** murmurs ABD:  Flat, positive bowel sounds normal in frequency in pitch, no bruits, no rebound, no guarding, no midline pulsatile mass, no hepatomegaly, no splenomegaly EXT:  2 plus pulses throughout, no edema, no cyanosis no clubbing     ***GENERAL:  Well appearing HEENT:  Pupils equal round and reactive, fundi not visualized, oral mucosa unremarkable NECK:  No jugular venous distention, waveform within normal limits, carotid upstroke brisk and symmetric, no bruits, no thyromegaly LYMPHATICS:  No cervical, inguinal adenopathy LUNGS:  Clear to auscultation bilaterally BACK:  No CVA tenderness CHEST:  Unremarkable HEART:  PMI not displaced or sustained,S1 and S2 within normal limits, no S3, no S4, no clicks, no rubs, *** murmurs ABD:  Flat, positive bowel sounds normal in frequency in pitch, no bruits, no rebound, no guarding, no midline pulsatile mass, no hepatomegaly, no splenomegaly EXT:  2 plus pulses throughout, no edema, no cyanosis no clubbing SKIN:  No rashes no nodules NEURO:  Cranial nerves II through XII grossly intact, motor grossly intact throughout PSYCH:  Cognitively intact, oriented to person place and time    EKG:  EKG {ACTION; IS/IS OZD:[id number]} ordered today.  The ekg ordered today demonstrates ***   Recent Labs: 09/30/2018: ALT 25; BUN 33; Creatinine, Ser 1.50; Hemoglobin 12.1; Platelets 176; Potassium 3.8; Sodium 139; TSH 4.270    Lipid Panel    Component Value Date/Time   CHOL 114 07/24/2018 1552   CHOL 127 02/06/2013 0821   TRIG 54 07/24/2018 1552   TRIG 72 05/08/2017 0815   TRIG 59 02/06/2013 0821   HDL 56 07/24/2018 1552   HDL 52 05/08/2017 0815   HDL 48 02/06/2013 0821   CHOLHDL 2.0 07/24/2018 1552   LDLCALC 47 07/24/2018 1552   LDLCALC 76 03/19/2014 0816   LDLCALC 67 02/06/2013 0821      Wt Readings from Last 3 Encounters:  10/16/18 152 lb (68.9 kg)  10/09/18 148 lb (67.1  kg)  09/26/18 148 lb 8 oz (67.4 kg)      Other studies Reviewed: Additional studies/ records that were reviewed today include: ***. Review of the above records demonstrates:  Please see elsewhere in the note.  ***   ASSESSMENT AND PLAN:   CHRONIC ATRIAL FIB:  ***  CAD:  ***  CKD:  ***  DYSLIPIDEMIA:  ***    Current medicines are reviewed at length with the patient today.  The patient {ACTIONS; HAS/DOES NOT HAVE:19233} concerns regarding medicines.  The following changes have been made:  {PLAN; NO CHANGE:13088:s}  Labs/ tests ordered today include: *** No orders of the defined types were placed in this encounter.    Disposition:   FU with ***    Signed, Minus Breeding, MD  10/31/2018 8:35 PM    [facility]

## 2018-11-01 ENCOUNTER — Ambulatory Visit: Payer: Medicare Other | Admitting: Cardiology

## 2018-11-02 ENCOUNTER — Other Ambulatory Visit: Payer: Self-pay

## 2018-11-02 ENCOUNTER — Encounter: Payer: Self-pay | Admitting: Family Medicine

## 2018-11-02 ENCOUNTER — Ambulatory Visit (INDEPENDENT_AMBULATORY_CARE_PROVIDER_SITE_OTHER): Payer: Medicare Other | Admitting: Family Medicine

## 2018-11-02 VITALS — BP 135/79 | HR 74 | Temp 97.0°F | Ht 68.5 in | Wt 155.0 lb

## 2018-11-02 DIAGNOSIS — R7989 Other specified abnormal findings of blood chemistry: Secondary | ICD-10-CM | POA: Diagnosis not present

## 2018-11-02 DIAGNOSIS — R0601 Orthopnea: Secondary | ICD-10-CM | POA: Diagnosis not present

## 2018-11-02 NOTE — Progress Notes (Signed)
Subjective:  Patient ID: Keith Hill, male    DOB: [birth year], 85 y.o.   MRN: [id number]  Chief Complaint:  URI (sinus drainage, feels like he is "breathing hard")   HPI: Keith Hill is a 85 y.o. male presenting on 11/02/2018 for URI (sinus drainage, feels like he is "breathing hard")  Pt presents today with complaints of orthopnea. Pt states this has been present for 2 weeks. States he only notices the tachypnea when he is in the bed. States he is fine if he sleeps in his recliner. He denies chest pain, palpitations, weakness, weight gain, lower extremity swelling, anxiety, confusion, dizziness, or syncope. No increased fatigue. No shortness of breath with exertion. No fever, chills, or congestion.   Relevant past medical, surgical, family, and social history reviewed and updated as indicated.  Allergies and medications reviewed and updated.   Past Medical History:  Diagnosis Date  . Allergic rhinitis, cause unspecified   . Cardiac dysrhythmia, unspecified   . Colon polyps   . Essential and other specified forms of tremor   . GERD (gastroesophageal reflux disease)   . Hemorrhoids, external   . Hiatal hernia   . Hyperlipidemia   . Hyperplasia of prostate   . Hypertension   . Lumbar disc disease   . Pancreatitis 1976  . Paralysis agitans ([place]) 11/07/2012  . Parkinson disease ([place])   . Peyronie's disease   . Prostate cancer [facility])    prostate    Past Surgical History:  Procedure Laterality Date  . CATARACT EXTRACTION    . EYE SURGERY    . HEMORRHOID SURGERY    . KNEE SURGERY Left   . PROSTATE BIOPSY  04/03/2013  . PROSTATE BIOPSY  11/01/2011  . SHOULDER SURGERY Left     Social History   Socioeconomic History  . Marital status: Married    Spouse name: [name]  . Number of children: 1  . Years of education: College  . Highest education level: Not on file  Occupational History  . Occupation: retired    Comment: Building control surveyor: RETIRED  . Occupation:  retired  Scientific laboratory technician  . Financial resource strain: Not hard at all  . Food insecurity:    Worry: Never true    Inability: Never true  . Transportation needs:    Medical: No    Non-medical: No  Tobacco Use  . Smoking status: Never Smoker  . Smokeless tobacco: Never Used  Substance and Sexual Activity  . Alcohol use: Never    Frequency: Never  . Drug use: Never  . Sexual activity: Yes  Lifestyle  . Physical activity:    Days per week: 0 days    Minutes per session: 0 min  . Stress: Not at all  Relationships  . Social connections:    Talks on phone: More than three times a week    Gets together: More than three times a week    Attends religious service: More than 4 times per year    Active member of club or organization: Yes    Attends meetings of clubs or organizations: More than 4 times per year    Relationship status: Married  . Intimate partner violence:    Fear of current or ex partner: No    Emotionally abused: No    Physically abused: No    Forced sexual activity: No  Other Topics Concern  . Not on file  Social History Narrative   **  Merged History Encounter **       Patient lives at home with spouse. Caffeine Use: 2-3 sodas daily Patient is right handed.    Outpatient Encounter Medications as of 11/02/2018  Medication Sig  . acetaminophen (TYLENOL) 500 MG tablet Take 500 mg by mouth every 6 (six) hours as needed.  Marland Kitchen atorvastatin (LIPITOR) 20 MG tablet TAKE (1) TABLET DAILY AS DIRECTED.  . carbidopa-levodopa (SINEMET IR) 25-100 MG tablet TAKE 1&1/2 TABLETS IN THE MORNING AND 1 AT NOON AND EVENING  . Cholecalciferol (VITAMIN D3) 5000 UNITS CAPS Take 1 capsule by mouth 4 (four) times a week. Reported on 09/14/2015  . docusate sodium (COLACE) 100 MG capsule Take 100 mg by mouth daily. daily  . doxylamine, Sleep, (UNISOM) 25 MG tablet Take 12.5 mg by mouth at bedtime as needed.  Marland Kitchen ELIQUIS 5 MG TABS tablet TAKE (1) TABLET TWICE A DAY.  Marland Kitchen guaiFENesin (MUCINEX) 600  MG 12 hr tablet Take 600 mg by mouth 2 (two) times daily as needed.  [name] Oil 300 MG CAPS Take 500 mg by mouth daily. Reported on 09/14/2015  . Magnesium 250 MG TABS Take 2 tablets by mouth at bedtime as needed.   . metoprolol (LOPRESSOR) 50 MG tablet Take 50 mg by mouth daily.   . Multiple Vitamins-Minerals (PRESERVISION AREDS 2 PO) Take 1 tablet by mouth daily.   Marland Kitchen NASAL SALINE NA Place into the nose. [facility] brand  . rasagiline (AZILECT) 1 MG TABS tablet Take 1 tablet (1 mg total) by mouth daily.  Marland Kitchen terazosin (HYTRIN) 2 MG capsule TAKE (1) CAPSULE DAILY AT BEDTIME.  Marland Kitchen VIRT-GARD 2.2-25-1 MG TABS TAKE 1 TABLET DAILY   No facility-administered encounter medications on file as of 11/02/2018.     Allergies  Allergen Reactions  . Aspirin     Upsets stomach  . Aspirin Nausea And Vomiting  . Calicum Glycerophosphate   . Niaspan [Niacin Er]   . Zocor [Simvastatin]     Review of Systems  Constitutional: Negative for activity change, appetite change, chills, diaphoresis, fatigue, fever and unexpected weight change.  HENT: Negative for congestion, postnasal drip and rhinorrhea.   Respiratory: Positive for shortness of breath (orthopnea). Negative for apnea, cough, choking, chest tightness, wheezing and stridor.   Cardiovascular: Negative for chest pain, palpitations and leg swelling.  Gastrointestinal: Negative for abdominal distention.  Genitourinary: Negative for decreased urine volume, difficulty urinating and flank pain.  Neurological: Negative for dizziness, syncope, weakness, light-headedness and headaches.  Psychiatric/Behavioral: Negative for confusion.  All other systems reviewed and are negative.       Objective:  BP 135/79   Pulse 74   Temp (!) 97 F (36.1 C) (Oral)   Ht 5' 8.5" (1.74 m)   Wt 155 lb (70.3 kg)   SpO2 99%   BMI 23.22 kg/m    Wt Readings from Last 3 Encounters:  11/02/18 155 lb (70.3 kg)  10/16/18 152 lb (68.9 kg)  10/09/18 148 lb (67.1 kg)     Physical Exam Vitals signs and nursing note reviewed.  Constitutional:      General: He is not in acute distress.    Appearance: Normal appearance. He is not ill-appearing, toxic-appearing or diaphoretic.  HENT:     Head: Normocephalic and atraumatic.     Right Ear: Tympanic membrane, ear canal and external ear normal.     Left Ear: Ear canal and external ear normal.     Nose: Nose normal.     Mouth/Throat:  Mouth: Mucous membranes are moist.     Pharynx: Oropharynx is clear.  Eyes:     Extraocular Movements: Extraocular movements intact.     Conjunctiva/sclera: Conjunctivae normal.     Pupils: Pupils are equal, round, and reactive to light.  Neck:     Musculoskeletal: Full passive range of motion without pain and neck supple.     Vascular: No carotid bruit or JVD.     Trachea: Trachea and phonation normal.  Cardiovascular:     Rate and Rhythm: Normal rate. Rhythm regularly irregular.     Chest Wall: PMI is not displaced.     Pulses: Normal pulses.     Heart sounds: Heart sounds not distant. No murmur. No friction rub. No gallop. No S3 sounds.   Pulmonary:     Effort: Pulmonary effort is normal. No respiratory distress.     Breath sounds: Normal breath sounds. No wheezing, rhonchi or rales.  Abdominal:     General: Bowel sounds are normal. There is no distension.     Palpations: Abdomen is soft.     Tenderness: There is no abdominal tenderness.  Musculoskeletal:     Right lower leg: No edema.     Left lower leg: No edema.  Skin:    General: Skin is warm and dry.     Capillary Refill: Capillary refill takes less than 2 seconds.     Coloration: Skin is not cyanotic or pale.  Neurological:     General: No focal deficit present.     Mental Status: He is alert and oriented to person, place, and time.  Psychiatric:        Mood and Affect: Mood normal.        Behavior: Behavior normal. Behavior is cooperative.        Thought Content: Thought content normal.         Judgment: Judgment normal.     Results for orders placed or performed in visit on 10/01/18  Fecal occult blood, imunochemical  Result Value Ref Range   Fecal Occult Bld Negative Negative       Pertinent labs & imaging results that were available during my care of the patient were reviewed by me and considered in my medical decision making.  Assessment & Plan:  Dossie was seen today for uri.  Diagnoses and all orders for this visit:  Orthopnea No signs of fluid retention today. No S3, lower extremity edema, or rales on exam. Pt to trial propping on extra pillow at night. Will draw BNP today. Pt aware of signs and symptoms that warrant emergent evaluation. Pt to keep follow up appointment with Dr. Laurance Flatten, return sooner if symptoms worsen.  -     Brain natriuretic peptide     Continue all other maintenance medications.  Follow up plan: Return for follow up with Dr. Laurance Flatten as scheduled. .   The above assessment and management plan was discussed with the patient. The patient verbalized understanding of and has agreed to the management plan. Patient is aware to call the clinic if symptoms persist or worsen. Patient is aware when to return to the clinic for a follow-up visit. Patient educated on when it is appropriate to go to the emergency department.   Monia Pouch, FNP-C [facility] [phone]

## 2018-11-04 LAB — BRAIN NATRIURETIC PEPTIDE: BNP: 1000.8 pg/mL — ABNORMAL HIGH (ref 0.0–100.0)

## 2018-11-05 MED ORDER — FUROSEMIDE 20 MG PO TABS: 20.0000 mg | ORAL_TABLET | Freq: Every day | ORAL | 3 refills | Status: DC

## 2018-11-05 NOTE — Addendum Note (Signed)
Addended by: Baruch Gouty on: 11/05/2018 08:09 AM   Modules accepted: Orders

## 2018-11-06 ENCOUNTER — Ambulatory Visit: Payer: Medicare Other | Admitting: Pharmacist Clinician (PhC)/ Clinical Pharmacy Specialist

## 2018-11-07 ENCOUNTER — Other Ambulatory Visit: Payer: Self-pay | Admitting: Family Medicine

## 2018-11-11 ENCOUNTER — Other Ambulatory Visit: Payer: Self-pay | Admitting: Family Medicine

## 2018-11-11 ENCOUNTER — Other Ambulatory Visit: Payer: Self-pay | Admitting: *Deleted

## 2018-11-11 MED ORDER — METOPROLOL TARTRATE 50 MG PO TABS: 50.0000 mg | ORAL_TABLET | Freq: Every day | ORAL | 3 refills | Status: DC

## 2018-11-11 NOTE — Telephone Encounter (Signed)
Med questions answered - he was confused on BP meds - reviewed last OV note with him

## 2018-11-12 ENCOUNTER — Telehealth: Payer: Self-pay | Admitting: Cardiology

## 2018-11-12 NOTE — Telephone Encounter (Signed)
°*  STAT* If patient is at the pharmacy, call can be transferred to refill team.   1. Which medications need to be refilled? (please list name of each medication and dose if known) Metoprolol succ er 50mg  tablets once daily  2. Which pharmacy/location (including street and city if local pharmacy) is medication to be sent to? [facility], [place]  3. Do they need a 30 day or 90 day supply? [place]

## 2018-11-12 NOTE — Telephone Encounter (Signed)
This was refilled yesterday.

## 2018-11-13 ENCOUNTER — Ambulatory Visit: Payer: Medicare Other | Admitting: Cardiology

## 2018-11-26 ENCOUNTER — Other Ambulatory Visit: Payer: Self-pay

## 2018-11-26 ENCOUNTER — Other Ambulatory Visit: Payer: Self-pay | Admitting: *Deleted

## 2018-11-26 ENCOUNTER — Ambulatory Visit (INDEPENDENT_AMBULATORY_CARE_PROVIDER_SITE_OTHER): Payer: Medicare Other | Admitting: Family Medicine

## 2018-11-26 DIAGNOSIS — I950 Idiopathic hypotension: Secondary | ICD-10-CM

## 2018-11-26 DIAGNOSIS — I4891 Unspecified atrial fibrillation: Secondary | ICD-10-CM | POA: Diagnosis not present

## 2018-11-26 DIAGNOSIS — R2681 Unsteadiness on feet: Secondary | ICD-10-CM

## 2018-11-26 DIAGNOSIS — C61 Malignant neoplasm of prostate: Secondary | ICD-10-CM | POA: Diagnosis not present

## 2018-11-26 DIAGNOSIS — M549 Dorsalgia, unspecified: Secondary | ICD-10-CM

## 2018-11-26 DIAGNOSIS — G20 Parkinson's disease: Secondary | ICD-10-CM | POA: Diagnosis not present

## 2018-11-26 DIAGNOSIS — R5381 Other malaise: Secondary | ICD-10-CM | POA: Diagnosis not present

## 2018-11-26 DIAGNOSIS — R5383 Other fatigue: Secondary | ICD-10-CM

## 2018-11-26 DIAGNOSIS — G8929 Other chronic pain: Secondary | ICD-10-CM

## 2018-11-26 DIAGNOSIS — M5030 Other cervical disc degeneration, unspecified cervical region: Secondary | ICD-10-CM | POA: Diagnosis not present

## 2018-11-26 DIAGNOSIS — R531 Weakness: Secondary | ICD-10-CM | POA: Diagnosis not present

## 2018-11-26 DIAGNOSIS — Z79899 Other long term (current) drug therapy: Secondary | ICD-10-CM

## 2018-11-26 MED ORDER — METOPROLOL TARTRATE 50 MG PO TABS: 25.0000 mg | ORAL_TABLET | Freq: Two times a day (BID) | ORAL | 3 refills | Status: DC

## 2018-11-26 NOTE — Addendum Note (Signed)
Addended by: Zannie Cove on: 11/26/2018 03:01 PM   Modules accepted: Orders

## 2018-11-26 NOTE — Progress Notes (Signed)
Virtual Visit via telephone Note I connected with@ on 11/26/18 by telephone and verified that I am speaking with the correct person or authorized healthcare agent using two identifiers. Keith Hill is currently located at home and there are no unauthorized people in close proximity. I, Redge Gainer, MD, completed this visit while in a private location in my home office.  This visit type was conducted due to national recommendations for restrictions regarding the COVID-19 Pandemic (e.g. social distancing).  This format is felt to be most appropriate for this patient at this time.  All issues noted in this document were discussed and addressed.  No physical exam was performed.    I discussed the limitations, risks, security and privacy concerns of performing an evaluation and management service by telephone and the availability of in person appointments. I also discussed with the patient that there may be a patient responsible charge related to this service. The patient expressed understanding and agreed to proceed.   Date:  11/26/2018    ID:  Keith Hill      September 06, 1931        [id number]   Patient Care Team Patient Care Team: Chipper Herb, MD as PCP - General (Family Medicine) Kathrynn Ducking, MD as Consulting Physician (Neurology) Clarene Essex, MD as Consulting Physician (Gastroenterology) Irine Seal, MD as Consulting Physician (Urology) Jacolyn Reedy, MD as Consulting Physician (Cardiology) Suella Broad, MD as Consulting Physician (Physical Medicine and Rehabilitation) Langley Gauss, MD as Referring Physician (Pain Medicine) Michel Harrow, [place] as Referring Physician (Chiropractic Medicine) Chipper Herb, MD (Family Medicine) Clent Jacks, MD as Consulting Physician (Ophthalmology) Iran Planas, MD as Consulting Physician (Orthopedic Surgery)  Reason for Visit: Primary Care Follow-up     History of Present Illness & Review of Systems:     Keith Hill  is a 85 y.o. year old male primary care patient that presents today for a telehealth visit.  -The patient is alert today and feeling well other than having to take a lot of medications that he wishes he did not have to take.  He and his wife have both been in the house for the past 2 to 3 weeks because of COVID-19.  His son is very attentive to taking care of them and making sure they have the food and supplies that are necessary.  They also have people come in and help him with house chores and cleaning on a daily basis.  The patient has a history of hypertension Parkinson's atrial fibrillation and back pain from arthritis.  Review of systems as stated otherwise negative for body systems left unmentioned.   The patient does not have symptoms concerning for COVID-19 infection (fever, chills, cough, or new shortness of breath).      Current Medications (Verified) Allergies as of 11/26/2018      Reactions   Aspirin    Upsets stomach   Aspirin Nausea And Vomiting   Calicum Glycerophosphate    Niaspan [niacin Er]    Zocor [simvastatin]       Medication List       Accurate as of November 26, 2018 12:00 PM. Always use your most recent med list.        acetaminophen 500 MG tablet Commonly known as:  TYLENOL Take 500 mg by mouth every 6 (six) hours as needed.   atorvastatin 20 MG tablet Commonly known as:  LIPITOR TAKE (1) TABLET DAILY AS DIRECTED.   carbidopa-levodopa  25-100 MG tablet Commonly known as:  SINEMET IR TAKE 1&1/2 TABLETS IN THE MORNING AND 1 AT NOON AND EVENING   docusate sodium 100 MG capsule Commonly known as:  COLACE Take 100 mg by mouth daily. daily   doxylamine (Sleep) 25 MG tablet Commonly known as:  UNISOM Take 12.5 mg by mouth at bedtime as needed.   Eliquis 5 MG Tabs tablet Generic drug:  apixaban TAKE (1) TABLET TWICE A DAY.   furosemide 20 MG tablet Commonly known as:  LASIX Take 1 tablet (20 mg total) by mouth daily.   guaiFENesin 600 MG 12 hr tablet  Commonly known as:  MUCINEX Take 600 mg by mouth 2 (two) times daily as needed.   Krill Oil 300 MG Caps Take 500 mg by mouth daily. Reported on 09/14/2015   Magnesium 250 MG Tabs Take 2 tablets by mouth at bedtime as needed.   metoprolol tartrate 50 MG tablet Commonly known as:  LOPRESSOR Take 1 tablet (50 mg total) by mouth daily.   NASAL SALINE NA Place into the nose. [facility] brand   PRESERVISION AREDS 2 PO Take 1 tablet by mouth daily.   rasagiline 1 MG Tabs tablet Commonly known as:  AZILECT Take 1 tablet (1 mg total) by mouth daily.   terazosin 2 MG capsule Commonly known as:  HYTRIN TAKE (1) CAPSULE DAILY AT BEDTIME.   Virt-Gard 2.2-25-1 MG Tabs Generic drug:  Folic Acid-Vit T5-TDD U20 TAKE 1 TABLET DAILY   Vitamin D3 125 MCG (5000 UT) Caps Take 1 capsule by mouth 4 (four) times a week. Reported on 09/14/2015           Allergies (Verified)    Aspirin; Aspirin; Calicum glycerophosphate; Niaspan [niacin er]; and Zocor [simvastatin]  Past Medical History Past Medical History:  Diagnosis Date  . Allergic rhinitis, cause unspecified   . Cardiac dysrhythmia, unspecified   . Colon polyps   . Essential and other specified forms of tremor   . GERD (gastroesophageal reflux disease)   . Hemorrhoids, external   . Hiatal hernia   . Hyperlipidemia   . Hyperplasia of prostate   . Hypertension   . Lumbar disc disease   . Pancreatitis 1976  . Paralysis agitans ([place]) 11/07/2012  . Parkinson disease ([place])   . Peyronie's disease   . Prostate cancer [facility])    prostate     Past Surgical History:  Procedure Laterality Date  . CATARACT EXTRACTION    . EYE SURGERY    . HEMORRHOID SURGERY    . KNEE SURGERY Left   . PROSTATE BIOPSY  04/03/2013  . PROSTATE BIOPSY  11/01/2011  . SHOULDER SURGERY Left     Social History   Socioeconomic History  . Marital status: Married    Spouse name: [name]  . Number of children: 1  . Years of education: College  . Highest  education level: Not on file  Occupational History  . Occupation: retired    Comment: Building control surveyor: RETIRED  . Occupation: retired  Scientific laboratory technician  . Financial resource strain: Not hard at all  . Food insecurity:    Worry: Never true    Inability: Never true  . Transportation needs:    Medical: No    Non-medical: No  Tobacco Use  . Smoking status: Never Smoker  . Smokeless tobacco: Never Used  Substance and Sexual Activity  . Alcohol use: Never    Frequency: Never  . Drug use: Never  .  Sexual activity: Yes  Lifestyle  . Physical activity:    Days per week: 0 days    Minutes per session: 0 min  . Stress: Not at all  Relationships  . Social connections:    Talks on phone: More than three times a week    Gets together: More than three times a week    Attends religious service: More than 4 times per year    Active member of club or organization: Yes    Attends meetings of clubs or organizations: More than 4 times per year    Relationship status: Married  Other Topics Concern  . Not on file  Social History Narrative   ** Merged History Encounter **       Patient lives at home with spouse. Caffeine Use: 2-3 sodas daily Patient is right handed.     Family History  Problem Relation Age of Onset  . Heart disease Father   . Kidney failure Father   . Alcoholism Brother   . Heart attack Brother   . Hyperlipidemia Son   . Cancer Neg Hx       Labs/Other Tests and Data Reviewed:    Wt Readings from Last 3 Encounters:  11/02/18 155 lb (70.3 kg)  10/16/18 152 lb (68.9 kg)  10/09/18 148 lb (67.1 kg)   Temp Readings from Last 3 Encounters:  11/02/18 (!) 97 F (36.1 C) (Oral)  10/16/18 (!) 97 F (36.1 C) (Oral)  10/09/18 (!) 96.8 F (36 C) (Oral)   BP Readings from Last 3 Encounters:  11/02/18 135/79  10/16/18 (!) 95/51  10/09/18 (!) 86/48   Pulse Readings from Last 3 Encounters:  11/02/18 74  10/16/18 79  10/09/18 82     No results found for:  HGBA1C Lab Results  Component Value Date   LDLCALC 47 07/24/2018   CREATININE 1.50 (H) 09/30/2018       Chemistry      Component Value Date/Time   NA 139 09/30/2018 1044   K 3.8 09/30/2018 1044   CL 100 09/30/2018 1044   CO2 24 09/30/2018 1044   BUN 33 (H) 09/30/2018 1044   CREATININE 1.50 (H) 09/30/2018 1044   CREATININE 1.26 02/06/2013 0821      Component Value Date/Time   CALCIUM 8.9 09/30/2018 1044   ALKPHOS 106 09/30/2018 1044   AST 32 09/30/2018 1044   ALT 25 09/30/2018 1044   BILITOT 0.5 09/30/2018 1044         OBSERVATIONS/ OBJECTIVE:     The patient was pleasant and alert and a good historian regarding his medications and current condition.  His biggest complaints today were his back pain which is been ongoing and some constipation.  He said that his weight was stable.  He denies any chest pain pressure tightness or shortness of breath.  He denies any trouble with swallowing heartburn indigestion nausea vomiting diarrhea blood in the stool or black tarry bowel movements or change in bowel habits.  He does have some constipation but has not been taking his Colace as often recently during the questioning of GI complaints he was reminded to drink more water.  He has some nocturia and some nights it may be 3 or 4 times and some nights it may just be once.  There is no burning.  His lightheadedness and weakness are better.  His appetite is fair.  He has not had any falls.  Physical exam deferred due to nature of telephonic visit.  ASSESSMENT &  PLAN    Time:   Today, I have spent 30 minutes with the patient via telephone discussing the above including Covid precautions.     Visit Diagnoses: 1. Malaise and fatigue -That lab work as soon as possible and arrange for physical therapy to improve strength and gait  2. Idiopathic hypotension -Blood pressures are running in the 1 32-1 35 range over the 60s.  Patient will continue with current treatment  3. Parkinson's  -Follow-up with neurology as planned and resume physical therapy  4. Polypharmacy -Medications have been reduced somewhat in the past few months and he should continue with his current treatment regimen  5. Atrial fibrillation, unspecified type ([place]) -The patient does not complain with any chest pain or shortness of breath and no complaints of edema.  Follow-up with cardiology as planned  6. DDD (degenerative disc disease), cervical -The patient continues to have ongoing problems with degenerative disc disease in his back and he has his Parkinson's disease.  This is 1 reason for arranging for more physical therapy for him at home.  7. Parkinson disease ([place]) -Follow-up with Dr. Jannifer Franklin, neurology as planned and resume physical therapy  8. Prostate cancer [facility]) -Follow-up with urology as planned     Follow-up Instructions:  Patient Instructions  Continue with medications as currently discussed over the phone We will arrange for you to have physical therapy at home since you have not been able to do that recently and your condition has weakened and this is very helpful for your Parkinson's disease. Continue to drink plenty of water and fluids and stay well-hydrated Restart Colace and take 1 at least on Monday Wednesday and Friday Try to reduce the Unisom that you are currently taking a half of 125 mg tablet at night to only Monday Wednesday and Friday only other nights take extra strength Tylenol.  Unisom has diphenhydramine in it and this can increase the risk of dementia if taken on a regular basis. Drink plenty of water When COVID-19 virus issues are reduced you will need to come to the office and get lab work along with your wife. Continue to be careful and not put yourself at risk for falling    Continue current medications. Continue good therapeutic lifestyle changes which include good diet and exercise. Fall precautions discussed with patient. If an FOBT was given today-  please return it to our front desk. If you are over 77 years old - you may need Prevnar 41 or the adult Pneumonia vaccine.  **Flu shots are available--- please call and schedule a FLU-CLINIC appointment**  After your visit with [place] today you will receive a survey in the mail or online from [employer] regarding your care with [place]. Please take a moment to fill this out. Your feedback is very important to [place] as you can help [place] better understand your patient needs as well as improve your experience and satisfaction. WE CARE ABOUT YOU!!!     I discussed the assessment and treatment plan with the patient. The patient was provided an opportunity to ask questions and all were answered. The patient agreed with the plan and demonstrated an understanding of the instructions.   The patient was advised to call back or seek an in-person evaluation if the symptoms worsen or if the condition fails to improve as anticipated.  The above assessment and management plan was discussed with the patient. The patient verbalized understanding of and has agreed to the management plan. Patient is aware to call the  clinic if symptoms persist or worsen. Patient is aware when to return to the clinic for a follow-up visit. Patient educated on when it is appropriate to go to the emergency department.    Chipper Herb, MD [address], [place], [place] [address] Ph [phone]   Arrie Senate MD

## 2018-11-26 NOTE — Patient Instructions (Signed)
Continue with medications as currently discussed over the phone We will arrange for you to have physical therapy at home since you have not been able to do that recently and your condition has weakened and this is very helpful for your Parkinson's disease. Continue to drink plenty of water and fluids and stay well-hydrated Restart Colace and take 1 at least on Monday Wednesday and Friday Try to reduce the Unisom that you are currently taking a half of 125 mg tablet at night to only Monday Wednesday and Friday only other nights take extra strength Tylenol.  Unisom has diphenhydramine in it and this can increase the risk of dementia if taken on a regular basis. Drink plenty of water When COVID-19 virus issues are reduced you will need to come to the office and get lab work along with your wife. Continue to be careful and not put yourself at risk for falling

## 2018-11-28 DIAGNOSIS — I4891 Unspecified atrial fibrillation: Secondary | ICD-10-CM | POA: Diagnosis not present

## 2018-11-28 DIAGNOSIS — I10 Essential (primary) hypertension: Secondary | ICD-10-CM | POA: Diagnosis not present

## 2018-11-28 DIAGNOSIS — Z8546 Personal history of malignant neoplasm of prostate: Secondary | ICD-10-CM | POA: Diagnosis not present

## 2018-11-28 DIAGNOSIS — E785 Hyperlipidemia, unspecified: Secondary | ICD-10-CM | POA: Diagnosis not present

## 2018-11-28 DIAGNOSIS — M5030 Other cervical disc degeneration, unspecified cervical region: Secondary | ICD-10-CM | POA: Diagnosis not present

## 2018-11-28 DIAGNOSIS — Z7901 Long term (current) use of anticoagulants: Secondary | ICD-10-CM | POA: Diagnosis not present

## 2018-11-28 DIAGNOSIS — G8929 Other chronic pain: Secondary | ICD-10-CM | POA: Diagnosis not present

## 2018-11-28 DIAGNOSIS — I950 Idiopathic hypotension: Secondary | ICD-10-CM | POA: Diagnosis not present

## 2018-11-28 DIAGNOSIS — G20 Parkinson's disease: Secondary | ICD-10-CM | POA: Diagnosis not present

## 2018-11-28 DIAGNOSIS — K219 Gastro-esophageal reflux disease without esophagitis: Secondary | ICD-10-CM | POA: Diagnosis not present

## 2018-11-28 DIAGNOSIS — Z9181 History of falling: Secondary | ICD-10-CM | POA: Diagnosis not present

## 2018-11-28 DIAGNOSIS — M5136 Other intervertebral disc degeneration, lumbar region: Secondary | ICD-10-CM | POA: Diagnosis not present

## 2018-12-03 DIAGNOSIS — I950 Idiopathic hypotension: Secondary | ICD-10-CM | POA: Diagnosis not present

## 2018-12-03 DIAGNOSIS — M5136 Other intervertebral disc degeneration, lumbar region: Secondary | ICD-10-CM | POA: Diagnosis not present

## 2018-12-03 DIAGNOSIS — I10 Essential (primary) hypertension: Secondary | ICD-10-CM | POA: Diagnosis not present

## 2018-12-03 DIAGNOSIS — M5030 Other cervical disc degeneration, unspecified cervical region: Secondary | ICD-10-CM | POA: Diagnosis not present

## 2018-12-03 DIAGNOSIS — G8929 Other chronic pain: Secondary | ICD-10-CM | POA: Diagnosis not present

## 2018-12-03 DIAGNOSIS — G20 Parkinson's disease: Secondary | ICD-10-CM | POA: Diagnosis not present

## 2018-12-05 DIAGNOSIS — I950 Idiopathic hypotension: Secondary | ICD-10-CM | POA: Diagnosis not present

## 2018-12-05 DIAGNOSIS — I10 Essential (primary) hypertension: Secondary | ICD-10-CM | POA: Diagnosis not present

## 2018-12-05 DIAGNOSIS — G8929 Other chronic pain: Secondary | ICD-10-CM | POA: Diagnosis not present

## 2018-12-05 DIAGNOSIS — M5136 Other intervertebral disc degeneration, lumbar region: Secondary | ICD-10-CM | POA: Diagnosis not present

## 2018-12-05 DIAGNOSIS — M5030 Other cervical disc degeneration, unspecified cervical region: Secondary | ICD-10-CM | POA: Diagnosis not present

## 2018-12-05 DIAGNOSIS — G20 Parkinson's disease: Secondary | ICD-10-CM | POA: Diagnosis not present

## 2018-12-06 ENCOUNTER — Ambulatory Visit: Payer: Medicare Other | Admitting: Family Medicine

## 2018-12-09 DIAGNOSIS — M5030 Other cervical disc degeneration, unspecified cervical region: Secondary | ICD-10-CM | POA: Diagnosis not present

## 2018-12-09 DIAGNOSIS — I950 Idiopathic hypotension: Secondary | ICD-10-CM | POA: Diagnosis not present

## 2018-12-09 DIAGNOSIS — M5136 Other intervertebral disc degeneration, lumbar region: Secondary | ICD-10-CM | POA: Diagnosis not present

## 2018-12-09 DIAGNOSIS — I10 Essential (primary) hypertension: Secondary | ICD-10-CM | POA: Diagnosis not present

## 2018-12-09 DIAGNOSIS — G8929 Other chronic pain: Secondary | ICD-10-CM | POA: Diagnosis not present

## 2018-12-09 DIAGNOSIS — G20 Parkinson's disease: Secondary | ICD-10-CM | POA: Diagnosis not present

## 2018-12-10 ENCOUNTER — Other Ambulatory Visit: Payer: Self-pay

## 2018-12-10 ENCOUNTER — Ambulatory Visit (INDEPENDENT_AMBULATORY_CARE_PROVIDER_SITE_OTHER): Payer: Medicare Other

## 2018-12-10 DIAGNOSIS — G8929 Other chronic pain: Secondary | ICD-10-CM | POA: Diagnosis not present

## 2018-12-10 DIAGNOSIS — K219 Gastro-esophageal reflux disease without esophagitis: Secondary | ICD-10-CM | POA: Diagnosis not present

## 2018-12-10 DIAGNOSIS — E785 Hyperlipidemia, unspecified: Secondary | ICD-10-CM

## 2018-12-10 DIAGNOSIS — G20 Parkinson's disease: Secondary | ICD-10-CM

## 2018-12-10 DIAGNOSIS — I4891 Unspecified atrial fibrillation: Secondary | ICD-10-CM | POA: Diagnosis not present

## 2018-12-10 DIAGNOSIS — Z8546 Personal history of malignant neoplasm of prostate: Secondary | ICD-10-CM

## 2018-12-10 DIAGNOSIS — M5136 Other intervertebral disc degeneration, lumbar region: Secondary | ICD-10-CM | POA: Diagnosis not present

## 2018-12-10 DIAGNOSIS — I950 Idiopathic hypotension: Secondary | ICD-10-CM | POA: Diagnosis not present

## 2018-12-10 DIAGNOSIS — Z9181 History of falling: Secondary | ICD-10-CM

## 2018-12-10 DIAGNOSIS — M5030 Other cervical disc degeneration, unspecified cervical region: Secondary | ICD-10-CM

## 2018-12-10 DIAGNOSIS — I10 Essential (primary) hypertension: Secondary | ICD-10-CM

## 2018-12-10 DIAGNOSIS — Z7901 Long term (current) use of anticoagulants: Secondary | ICD-10-CM

## 2018-12-11 DIAGNOSIS — M5030 Other cervical disc degeneration, unspecified cervical region: Secondary | ICD-10-CM | POA: Diagnosis not present

## 2018-12-11 DIAGNOSIS — I950 Idiopathic hypotension: Secondary | ICD-10-CM | POA: Diagnosis not present

## 2018-12-11 DIAGNOSIS — M5136 Other intervertebral disc degeneration, lumbar region: Secondary | ICD-10-CM | POA: Diagnosis not present

## 2018-12-11 DIAGNOSIS — G20 Parkinson's disease: Secondary | ICD-10-CM | POA: Diagnosis not present

## 2018-12-11 DIAGNOSIS — G8929 Other chronic pain: Secondary | ICD-10-CM | POA: Diagnosis not present

## 2018-12-11 DIAGNOSIS — I10 Essential (primary) hypertension: Secondary | ICD-10-CM | POA: Diagnosis not present

## 2018-12-16 DIAGNOSIS — M5136 Other intervertebral disc degeneration, lumbar region: Secondary | ICD-10-CM | POA: Diagnosis not present

## 2018-12-16 DIAGNOSIS — M5030 Other cervical disc degeneration, unspecified cervical region: Secondary | ICD-10-CM | POA: Diagnosis not present

## 2018-12-16 DIAGNOSIS — I10 Essential (primary) hypertension: Secondary | ICD-10-CM | POA: Diagnosis not present

## 2018-12-16 DIAGNOSIS — I950 Idiopathic hypotension: Secondary | ICD-10-CM | POA: Diagnosis not present

## 2018-12-16 DIAGNOSIS — G8929 Other chronic pain: Secondary | ICD-10-CM | POA: Diagnosis not present

## 2018-12-16 DIAGNOSIS — G20 Parkinson's disease: Secondary | ICD-10-CM | POA: Diagnosis not present

## 2018-12-23 ENCOUNTER — Telehealth: Payer: Self-pay | Admitting: Family Medicine

## 2018-12-23 ENCOUNTER — Ambulatory Visit (INDEPENDENT_AMBULATORY_CARE_PROVIDER_SITE_OTHER): Payer: Medicare Other | Admitting: Family Medicine

## 2018-12-23 ENCOUNTER — Other Ambulatory Visit: Payer: Self-pay

## 2018-12-23 DIAGNOSIS — I129 Hypertensive chronic kidney disease with stage 1 through stage 4 chronic kidney disease, or unspecified chronic kidney disease: Secondary | ICD-10-CM

## 2018-12-23 DIAGNOSIS — M25551 Pain in right hip: Secondary | ICD-10-CM

## 2018-12-23 DIAGNOSIS — M5030 Other cervical disc degeneration, unspecified cervical region: Secondary | ICD-10-CM

## 2018-12-23 DIAGNOSIS — R531 Weakness: Secondary | ICD-10-CM

## 2018-12-23 DIAGNOSIS — G20 Parkinson's disease: Secondary | ICD-10-CM

## 2018-12-23 DIAGNOSIS — I700 Atherosclerosis of aorta: Secondary | ICD-10-CM | POA: Diagnosis not present

## 2018-12-23 DIAGNOSIS — C61 Malignant neoplasm of prostate: Secondary | ICD-10-CM | POA: Diagnosis not present

## 2018-12-23 DIAGNOSIS — R5381 Other malaise: Secondary | ICD-10-CM

## 2018-12-23 DIAGNOSIS — R5383 Other fatigue: Secondary | ICD-10-CM | POA: Diagnosis not present

## 2018-12-23 DIAGNOSIS — Z7901 Long term (current) use of anticoagulants: Secondary | ICD-10-CM

## 2018-12-23 DIAGNOSIS — N183 Chronic kidney disease, stage 3 (moderate): Secondary | ICD-10-CM | POA: Diagnosis not present

## 2018-12-23 MED ORDER — PREDNISONE 10 MG PO TABS: ORAL_TABLET | ORAL | 0 refills | Status: DC

## 2018-12-23 NOTE — Patient Instructions (Signed)
Continue with current medicines Drink plenty of water and fluids and stay well-hydrated Exercise and walk regularly Follow-up with specialist including neurologist and cardiologist as planned

## 2018-12-23 NOTE — Addendum Note (Signed)
Addended by: Zannie Cove on: 12/23/2018 04:26 PM   Modules accepted: Orders

## 2018-12-23 NOTE — Telephone Encounter (Signed)
Needed appt with moore

## 2018-12-23 NOTE — Progress Notes (Signed)
Virtual Visit Via telephone Note I connected with@ on 12/23/18 by telephone and verified that I am speaking with the correct person or authorized healthcare agent using two identifiers. Keith Hill is currently located at home and there are no unauthorized people in close proximity. I completed this visit while in a private location in my home .  I connected to the patient telephone and verified that I was speaking with the correct person.  This visit type was conducted due to national recommendations for restrictions regarding the COVID-19 Pandemic (e.g. social distancing).  This format is felt to be most appropriate for this patient at this time.  All issues noted in this document were discussed and addressed.  No physical exam was performed.    I discussed the limitations, risks, security and privacy concerns of performing an evaluation and management service by telephone and the availability of in person appointments. I also discussed with the patient that there may be a patient responsible charge related to this service. The patient expressed understanding and agreed to proceed.   Date:  12/23/2018    ID:  Keith Hill      Dec 21, 1931        [id number]   Patient Care Team Patient Care Team: Keith Herb, MD as PCP - General (Family Medicine) Keith Ducking, MD as Consulting Physician (Neurology) Keith Essex, MD as Consulting Physician (Gastroenterology) Keith Seal, MD as Consulting Physician (Urology) Keith Reedy, MD as Consulting Physician (Cardiology) Keith Broad, MD as Consulting Physician (Physical Medicine and Rehabilitation) Keith Gauss, MD as Referring Physician (Pain Medicine) Keith Hill, [place] as Referring Physician (Chiropractic Medicine) Keith Herb, MD (Family Medicine) Keith Jacks, MD as Consulting Physician (Ophthalmology) Keith Planas, MD as Consulting Physician (Orthopedic Surgery)  Reason for Visit: Primary Care Follow-up      History of Present Illness & Review of Systems:     Keith Hill is a 85 y.o. year old male primary care patient that presents today for a telehealth visit.  Talk with the patient's son and the patient.  The patient was at home.  The son feels like that he has getting weaker and because of sciatica type pain in his right hip and leg that he has become more and active.  We discussed the possibility of being depressed.  The patient says that he just does not have an appetite to eat a lot and he has lost some weight again after he had gained some weight recently he denies any chest pain pressure tightness or shortness of breath other than occasional palpitations.  He denies any trouble with his stomach including nausea vomiting diarrhea blood in the stool black tarry bowel movements.  He does have some frequency but no burning.  He is up-to-date on his visits to the neurologist.  He has an upcoming visit scheduled with Dr. Percival Hill soon because Dr. Wynonia Hill has retired.  Regarding his right hip discomfort he has had no injury.  Review of systems as stated otherwise negative for body systems left unmentioned.   The patient does not have symptoms concerning for COVID-19 infection (fever, chills, cough, or new shortness of breath).      Current Medications (Verified) Allergies as of 12/23/2018      Reactions   Aspirin    Upsets stomach   Aspirin Nausea And Vomiting   Calicum Glycerophosphate    Niaspan [niacin Er]    Zocor [simvastatin]  Medication List       Accurate as of Dec 23, 2018  2:51 PM. Always use your most recent med list.        acetaminophen 500 MG tablet Commonly known as:  TYLENOL Take 500 mg by mouth every 6 (six) hours as needed.   atorvastatin 20 MG tablet Commonly known as:  LIPITOR TAKE (1) TABLET DAILY AS DIRECTED.   carbidopa-levodopa 25-100 MG tablet Commonly known as:  SINEMET IR TAKE 1&1/2 TABLETS IN THE MORNING AND 1 AT NOON AND EVENING   docusate  sodium 100 MG capsule Commonly known as:  COLACE Take 100 mg by mouth daily. daily   doxylamine (Sleep) 25 MG tablet Commonly known as:  UNISOM Take 12.5 mg by mouth at bedtime as needed.   Eliquis 5 MG Tabs tablet Generic drug:  apixaban TAKE (1) TABLET TWICE A DAY.   guaiFENesin 600 MG 12 hr tablet Commonly known as:  MUCINEX Take 600 mg by mouth 2 (two) times daily as needed.   Krill Oil 300 MG Caps Take 500 mg by mouth daily. Reported on 09/14/2015   Magnesium 250 MG Tabs Take 2 tablets by mouth at bedtime as needed.   metoprolol tartrate 50 MG tablet Commonly known as:  LOPRESSOR Take 0.5 tablets (25 mg total) by mouth 2 (two) times daily.   NASAL SALINE NA Place into the nose. [facility] brand   PRESERVISION AREDS 2 PO Take 1 tablet by mouth daily.   rasagiline 1 MG Tabs tablet Commonly known as:  AZILECT Take 1 tablet (1 mg total) by mouth daily.   terazosin 2 MG capsule Commonly known as:  HYTRIN TAKE (1) CAPSULE DAILY AT BEDTIME.   Virt-Gard 2.2-25-1 MG Tabs Generic drug:  Folic Acid-Vit V6-HMC N47 TAKE 1 TABLET DAILY   Vitamin D3 125 MCG (5000 UT) Caps Take 1 capsule by mouth 4 (four) times a week. Reported on 09/14/2015           Allergies (Verified)    Aspirin; Aspirin; Calicum glycerophosphate; Niaspan [niacin er]; and Zocor [simvastatin]  Past Medical History Past Medical History:  Diagnosis Date  . Allergic rhinitis, cause unspecified   . Cardiac dysrhythmia, unspecified   . Colon polyps   . Essential and other specified forms of tremor   . GERD (gastroesophageal reflux disease)   . Hemorrhoids, external   . Hiatal hernia   . Hyperlipidemia   . Hyperplasia of prostate   . Hypertension   . Lumbar disc disease   . Pancreatitis 1976  . Paralysis agitans ([place]) 11/07/2012  . Parkinson disease ([place])   . Peyronie's disease   . Prostate cancer [facility])    prostate     Past Surgical History:  Procedure Laterality Date  . CATARACT EXTRACTION    .  EYE SURGERY    . HEMORRHOID SURGERY    . KNEE SURGERY Left   . PROSTATE BIOPSY  04/03/2013  . PROSTATE BIOPSY  11/01/2011  . SHOULDER SURGERY Left     Social History   Socioeconomic History  . Marital status: Married    Spouse name: Keith Hill  . Number of children: 1  . Years of education: College  . Highest education level: Not on file  Occupational History  . Occupation: retired    Comment: Building control surveyor: RETIRED  . Occupation: retired  Scientific laboratory technician  . Financial resource strain: Not hard at all  . Food insecurity:    Worry: Never true  Inability: Never true  . Transportation needs:    Medical: No    Non-medical: No  Tobacco Use  . Smoking status: Never Smoker  . Smokeless tobacco: Never Used  Substance and Sexual Activity  . Alcohol use: Never    Frequency: Never  . Drug use: Never  . Sexual activity: Yes  Lifestyle  . Physical activity:    Days per week: 0 days    Minutes per session: 0 min  . Stress: Not at all  Relationships  . Social connections:    Talks on phone: More than three times a week    Gets together: More than three times a week    Attends religious service: More than 4 times per year    Active member of club or organization: Yes    Attends meetings of clubs or organizations: More than 4 times per year    Relationship status: Married  Other Topics Concern  . Not on file  Social History Narrative   ** Merged History Encounter **       Patient lives at home with spouse. Caffeine Use: 2-3 sodas daily Patient is right handed.     Family History  Problem Relation Age of Onset  . Heart disease Father   . Kidney failure Father   . Alcoholism Brother   . Heart attack Brother   . Hyperlipidemia Son   . Cancer Neg Hx       Labs/Other Tests and Data Reviewed:    Wt Readings from Last 3 Encounters:  11/02/18 155 lb (70.3 kg)  10/16/18 152 lb (68.9 kg)  10/09/18 148 lb (67.1 kg)   Temp Readings from Last 3 Encounters:  11/02/18  (!) 97 F (36.1 C) (Oral)  10/16/18 (!) 97 F (36.1 C) (Oral)  10/09/18 (!) 96.8 F (36 C) (Oral)   BP Readings from Last 3 Encounters:  11/02/18 135/79  10/16/18 (!) 95/51  10/09/18 (!) 86/48   Pulse Readings from Last 3 Encounters:  11/02/18 74  10/16/18 79  10/09/18 82     No results found for: HGBA1C Lab Results  Component Value Date   LDLCALC 47 07/24/2018   CREATININE 1.50 (H) 09/30/2018       Chemistry      Component Value Date/Time   NA 139 09/30/2018 1044   K 3.8 09/30/2018 1044   CL 100 09/30/2018 1044   CO2 24 09/30/2018 1044   BUN 33 (H) 09/30/2018 1044   CREATININE 1.50 (H) 09/30/2018 1044   CREATININE 1.26 02/06/2013 0821      Component Value Date/Time   CALCIUM 8.9 09/30/2018 1044   ALKPHOS 106 09/30/2018 1044   AST 32 09/30/2018 1044   ALT 25 09/30/2018 1044   BILITOT 0.5 09/30/2018 1044         OBSERVATIONS/ OBJECTIVE:     The patient was alert and says he is getting down with being in the home and looking at for walls and not be able to get out of the house because of the COVID-19 pandemic.  According to his son his weight is down to 145.  His blood pressures at home have been running in the 1 35-1 40 range over the 60 range.  He says that on a previous episode when he was having trouble with the hip that a course of prednisone and an injection really made his hip feel better.  Physical exam deferred due to nature of telephonic visit.  ASSESSMENT & PLAN    Time:  Today, I have spent 22 minutes with the patient via telephone discussing the above including Covid precautions.     Visit Diagnoses: 1. Malaise and fatigue -Got better but seems to have worsened again because of having to stay in the home so much and not being able to get out.  2. Parkinson disease [facility]) -Follow-up with neurology as planned -In another month, arrange with physical therapy to come back and resume some type of physical activity next door in the rehab center.   3. Prostate cancer [facility]) -Follow-up with urology if needed  4. DDD (degenerative disc disease), cervical -He is especially bothered with the right hip.  He feels like he has sciatica.  5. General weakness -He continues to have general weakness and has had some additional weight loss after gaining some weight and he is feeling weaker because he does not have a lot of appetite.  This is based on what his son relayed to me.  6. Benign hypertension with chronic kidney disease, stage III (HCC) -Blood pressures at home are currently running in the 1 35-1 40 range over the 60 range.  He has occasional bouts of heart irregularity but not as much as he was since he started back on the metoprolol.  7. Aortic atherosclerosis ([place]) -Continue with aggressive therapeutic lifestyle changes  8. Chronic anticoagulation -Continue with Eliquis -Follow-up with Dr. Percival Hill as planned  9.  Right hip pain -Prednisone taper -May need injection if this does not get better.     The above assessment and management plan was discussed with the patient. The patient verbalized understanding of and has agreed to the management plan. Patient is aware to call the clinic if symptoms persist or worsen. Patient is aware when to return to the clinic for a follow-up visit. Patient educated on when it is appropriate to go to the emergency department.    Keith Herb, MD [address], [place], [place] [address] Ph [phone]   Arrie Senate MD

## 2018-12-24 ENCOUNTER — Encounter: Payer: Medicare Other | Admitting: *Deleted

## 2018-12-25 DIAGNOSIS — I10 Essential (primary) hypertension: Secondary | ICD-10-CM | POA: Diagnosis not present

## 2018-12-25 DIAGNOSIS — I950 Idiopathic hypotension: Secondary | ICD-10-CM | POA: Diagnosis not present

## 2018-12-25 DIAGNOSIS — G8929 Other chronic pain: Secondary | ICD-10-CM | POA: Diagnosis not present

## 2018-12-25 DIAGNOSIS — M5136 Other intervertebral disc degeneration, lumbar region: Secondary | ICD-10-CM | POA: Diagnosis not present

## 2018-12-25 DIAGNOSIS — G20 Parkinson's disease: Secondary | ICD-10-CM | POA: Diagnosis not present

## 2018-12-25 DIAGNOSIS — M5030 Other cervical disc degeneration, unspecified cervical region: Secondary | ICD-10-CM | POA: Diagnosis not present

## 2018-12-27 DIAGNOSIS — I10 Essential (primary) hypertension: Secondary | ICD-10-CM | POA: Diagnosis not present

## 2018-12-27 DIAGNOSIS — G20 Parkinson's disease: Secondary | ICD-10-CM | POA: Diagnosis not present

## 2018-12-27 DIAGNOSIS — M5136 Other intervertebral disc degeneration, lumbar region: Secondary | ICD-10-CM | POA: Diagnosis not present

## 2018-12-27 DIAGNOSIS — I950 Idiopathic hypotension: Secondary | ICD-10-CM | POA: Diagnosis not present

## 2018-12-27 DIAGNOSIS — G8929 Other chronic pain: Secondary | ICD-10-CM | POA: Diagnosis not present

## 2018-12-27 DIAGNOSIS — M5030 Other cervical disc degeneration, unspecified cervical region: Secondary | ICD-10-CM | POA: Diagnosis not present

## 2018-12-28 DIAGNOSIS — E785 Hyperlipidemia, unspecified: Secondary | ICD-10-CM | POA: Diagnosis not present

## 2018-12-28 DIAGNOSIS — G8929 Other chronic pain: Secondary | ICD-10-CM | POA: Diagnosis not present

## 2018-12-28 DIAGNOSIS — I4891 Unspecified atrial fibrillation: Secondary | ICD-10-CM | POA: Diagnosis not present

## 2018-12-28 DIAGNOSIS — M5030 Other cervical disc degeneration, unspecified cervical region: Secondary | ICD-10-CM | POA: Diagnosis not present

## 2018-12-28 DIAGNOSIS — Z8546 Personal history of malignant neoplasm of prostate: Secondary | ICD-10-CM | POA: Diagnosis not present

## 2018-12-28 DIAGNOSIS — Z9181 History of falling: Secondary | ICD-10-CM | POA: Diagnosis not present

## 2018-12-28 DIAGNOSIS — Z7901 Long term (current) use of anticoagulants: Secondary | ICD-10-CM | POA: Diagnosis not present

## 2018-12-28 DIAGNOSIS — M5136 Other intervertebral disc degeneration, lumbar region: Secondary | ICD-10-CM | POA: Diagnosis not present

## 2018-12-28 DIAGNOSIS — I950 Idiopathic hypotension: Secondary | ICD-10-CM | POA: Diagnosis not present

## 2018-12-28 DIAGNOSIS — I10 Essential (primary) hypertension: Secondary | ICD-10-CM | POA: Diagnosis not present

## 2018-12-28 DIAGNOSIS — K219 Gastro-esophageal reflux disease without esophagitis: Secondary | ICD-10-CM | POA: Diagnosis not present

## 2018-12-28 DIAGNOSIS — G20 Parkinson's disease: Secondary | ICD-10-CM | POA: Diagnosis not present

## 2018-12-30 DIAGNOSIS — M5030 Other cervical disc degeneration, unspecified cervical region: Secondary | ICD-10-CM | POA: Diagnosis not present

## 2018-12-30 DIAGNOSIS — M5136 Other intervertebral disc degeneration, lumbar region: Secondary | ICD-10-CM | POA: Diagnosis not present

## 2018-12-30 DIAGNOSIS — G20 Parkinson's disease: Secondary | ICD-10-CM | POA: Diagnosis not present

## 2018-12-30 DIAGNOSIS — I10 Essential (primary) hypertension: Secondary | ICD-10-CM | POA: Diagnosis not present

## 2018-12-30 DIAGNOSIS — G8929 Other chronic pain: Secondary | ICD-10-CM | POA: Diagnosis not present

## 2018-12-30 DIAGNOSIS — I950 Idiopathic hypotension: Secondary | ICD-10-CM | POA: Diagnosis not present

## 2019-01-01 DIAGNOSIS — M5136 Other intervertebral disc degeneration, lumbar region: Secondary | ICD-10-CM | POA: Diagnosis not present

## 2019-01-01 DIAGNOSIS — I950 Idiopathic hypotension: Secondary | ICD-10-CM | POA: Diagnosis not present

## 2019-01-01 DIAGNOSIS — I10 Essential (primary) hypertension: Secondary | ICD-10-CM | POA: Diagnosis not present

## 2019-01-01 DIAGNOSIS — M5030 Other cervical disc degeneration, unspecified cervical region: Secondary | ICD-10-CM | POA: Diagnosis not present

## 2019-01-01 DIAGNOSIS — G20 Parkinson's disease: Secondary | ICD-10-CM | POA: Diagnosis not present

## 2019-01-01 DIAGNOSIS — G8929 Other chronic pain: Secondary | ICD-10-CM | POA: Diagnosis not present

## 2019-01-02 ENCOUNTER — Telehealth: Payer: Self-pay | Admitting: *Deleted

## 2019-01-02 NOTE — Telephone Encounter (Signed)
YOUR CARDIOLOGY TEAM HAS ARRANGED FOR AN E-VISIT FOR YOUR APPOINTMENT - PLEASE REVIEW IMPORTANT INFORMATION BELOW SEVERAL DAYS PRIOR TO YOUR APPOINTMENT  Due to the recent COVID-19 pandemic, we are transitioning in-person office visits to tele-medicine visits in an effort to decrease unnecessary exposure to our patients, their families, and staff. These visits are billed to your insurance just like a normal visit is. We also encourage you to sign up for MyChart if you have not already done so. You will need a smartphone if possible. For patients that do not have this, we can still complete the visit using a regular telephone but do prefer a smartphone to enable video when possible. You may have a family member that lives with you that can help. If possible, we also ask that you have a blood pressure cuff and scale at home to measure your blood pressure, heart rate and weight prior to your scheduled appointment. Patients with clinical needs that need an in-person evaluation and testing will still be able to come to the office if absolutely necessary. If you have any questions, feel free to call our office.  YOUR PROVIDER WILL BE USING THE FOLLOWING PLATFORM TO COMPLETE YOUR VISIT: phone visit   2-3 [place] will receive a telephone call from one of our [place] team members - your caller ID may say "Unknown caller." If this is a video visit, we will walk you through how to get the video launched on your phone. We will remind you check your blood pressure, heart rate and weight prior to your scheduled appointment. If you have an Apple Watch or Kardia, please upload any pertinent ECG strips the day before or morning of your appointment to [facility]. Our staff will also make sure you have reviewed the consent and agree to move forward with your scheduled tele-health visit.   THE DAY OF YOUR APPOINTMENT  Approximately 15 minutes prior to your scheduled appointment, you will receive a  telephone call from one of [facility] team - your caller ID may say "Unknown caller."  Our staff will confirm medications, vital signs for the day and any symptoms you may be experiencing. Please have this information available prior to the time of visit start. It may also be helpful for you to have a pad of paper and pen handy for any instructions given during your visit. They will also walk you through joining the smartphone meeting if this is a video visit.  CONSENT FOR TELE-HEALTH VISIT - PLEASE REVIEW  I hereby voluntarily request, consent and authorize [facility] and its employed or contracted physicians, physician assistants, nurse practitioners or other licensed health care professionals (the Practitioner), to provide me with telemedicine health care services (the "Services") as deemed necessary by the treating Practitioner. I acknowledge and consent to receive the Services by the Practitioner via telemedicine. I understand that the telemedicine visit will involve communicating with the Practitioner through live audiovisual communication technology and the disclosure of certain medical information by electronic transmission. I acknowledge that I have been given the opportunity to request an in-person assessment or other available alternative prior to the telemedicine visit and am voluntarily participating in the telemedicine visit.  I understand that I have the right to withhold or withdraw my consent to the use of telemedicine in the course of my care at any time, without affecting my right to future care or treatment, and that the Practitioner or I may terminate the telemedicine visit at any time. I  understand that I have the right to inspect all information obtained and/or recorded in the course of the telemedicine visit and may receive copies of available information for a reasonable fee.  I understand that some of the potential risks of receiving the Services via telemedicine include:  Marland Kitchen Delay  or interruption in medical evaluation due to technological equipment failure or disruption; . Information transmitted may not be sufficient (e.g. poor resolution of images) to allow for appropriate medical decision making by the Practitioner; and/or  . In rare instances, security protocols could fail, causing a breach of personal health information.  Furthermore, I acknowledge that it is my responsibility to provide information about my medical history, conditions and care that is complete and accurate to the best of my ability. I acknowledge that Practitioner's advice, recommendations, and/or decision may be based on factors not within their control, such as incomplete or inaccurate data provided by me or distortions of diagnostic images or specimens that may result from electronic transmissions. I understand that the practice of medicine is not an exact science and that Practitioner makes no warranties or guarantees regarding treatment outcomes. I acknowledge that I will receive a copy of this consent concurrently upon execution via email to the email address I last provided but may also request a printed copy by calling the office of [facility].    I understand that my insurance will be billed for this visit.   I have read or had this consent read to me. . I understand the contents of this consent, which adequately explains the benefits and risks of the Services being provided via telemedicine.  . I have been provided ample opportunity to ask questions regarding this consent and the Services and have had my questions answered to my satisfaction. . I give my informed consent for the services to be provided through the use of telemedicine in my medical care  By participating in this telemedicine visit I agree to the above. Consent for phone appt obtained

## 2019-01-06 DIAGNOSIS — G20 Parkinson's disease: Secondary | ICD-10-CM | POA: Diagnosis not present

## 2019-01-06 DIAGNOSIS — M5136 Other intervertebral disc degeneration, lumbar region: Secondary | ICD-10-CM | POA: Diagnosis not present

## 2019-01-06 DIAGNOSIS — G8929 Other chronic pain: Secondary | ICD-10-CM | POA: Diagnosis not present

## 2019-01-06 DIAGNOSIS — I950 Idiopathic hypotension: Secondary | ICD-10-CM | POA: Diagnosis not present

## 2019-01-06 DIAGNOSIS — M5030 Other cervical disc degeneration, unspecified cervical region: Secondary | ICD-10-CM | POA: Diagnosis not present

## 2019-01-06 DIAGNOSIS — I10 Essential (primary) hypertension: Secondary | ICD-10-CM | POA: Diagnosis not present

## 2019-01-07 NOTE — Progress Notes (Signed)
Virtual Visit via Video Note   This visit type was conducted due to national recommendations for restrictions regarding the COVID-19 Pandemic (e.g. social distancing) in an effort to limit this patient's exposure and mitigate transmission in our community.  Due to his co-morbid illnesses, this patient is at least at moderate risk for complications without adequate follow up.  This format is felt to be most appropriate for this patient at this time.  All issues noted in this document were discussed and addressed.  A limited physical exam was performed with this format.  Please refer to the patient's chart for his consent to telehealth for [facility].   Date:  01/08/2019   ID:  Keith Hill, DOB [birth year], MRN [id number]  Patient Location: Home Provider Location: Home  PCP:  Chipper Herb, MD  Cardiologist:  Dr. Percival Spanish Electrophysiologist:  None   Evaluation Performed:  Follow-Up Visit  Chief Complaint:  Palpitations  History of Present Illness:    Keith Hill is a 85 y.o. male who presents for follow up of CAD hypertension and hyperlipidemia.  He ws previously seen by Dr. Wynonia Lawman.  He was last seen by Dr. Bettina Gavia prior to dental surgery.   Back to previous records and he has had a stress perfusion study in 2017 that was negative for ischemia.  He had an echocardiogram at that time.  There was a mention of mild to moderate AI and MR but a well-preserved ejection fraction left atrial enlargement.  He does well been limited a little bit by Parkinson's.  He rarely feels palpitations.  He denies presyncope or syncope.  He has no chest discomfort, neck or arm discomfort.  He might get short of breath climbing up a hill he has no resting shortness of breath, PND or orthopnea.  He is had no weight gain or edema.  He tolerates anticoagulation.  I do note that he had an increased BNP.  He was treated with diuretics.  However, the knee does not report any symptoms.  I am to see him back in about  2 months to decide on getting another echocardiogram.  The patient does not have symptoms concerning for COVID-19 infection (fever, chills, cough, or new shortness of breath).    Past Medical History:  Diagnosis Date   Allergic rhinitis, cause unspecified    Cardiac dysrhythmia, unspecified    Colon polyps    Essential and other specified forms of tremor    GERD (gastroesophageal reflux disease)    Hemorrhoids, external    Hiatal hernia    Hyperlipidemia    Hyperplasia of prostate    Hypertension    Lumbar disc disease    Pancreatitis 1976   Paralysis agitans ([place]) 11/07/2012   Parkinson disease ([place])    Peyronie's disease    Prostate cancer ([place])    prostate   Past Surgical History:  Procedure Laterality Date   CATARACT EXTRACTION     EYE SURGERY     HEMORRHOID SURGERY     KNEE SURGERY Left    PROSTATE BIOPSY  04/03/2013   PROSTATE BIOPSY  11/01/2011   SHOULDER SURGERY Left      Current Meds  Medication Sig   acetaminophen (TYLENOL) 500 MG tablet Take 500 mg by mouth every 6 (six) hours as needed.   atorvastatin (LIPITOR) 20 MG tablet TAKE (1) TABLET DAILY AS DIRECTED.   carbidopa-levodopa (SINEMET IR) 25-100 MG tablet TAKE 1&1/2 TABLETS IN THE MORNING AND 1 AT [facility] [place] AND EVENING  Cholecalciferol (VITAMIN D3) 5000 UNITS CAPS Take 1 capsule by mouth 3 (three) times a week. Reported on 09/14/2015   docusate sodium (COLACE) 100 MG capsule Take 100 mg by mouth daily as needed. daily   doxylamine, Sleep, (UNISOM) 25 MG tablet Take 12.5 mg by mouth at bedtime as needed.   ELIQUIS 5 MG TABS tablet TAKE (1) TABLET TWICE A DAY.   guaiFENesin (MUCINEX) 600 MG 12 hr tablet Take 600 mg by mouth 2 (two) times daily as needed.   Krill Oil 300 MG CAPS Take 500 mg by mouth daily. Reported on 09/14/2015   Magnesium 250 MG TABS Take 2 tablets by mouth at bedtime as needed.    metoprolol tartrate (LOPRESSOR) 50 MG tablet Take 0.5 tablets (25 mg total) by  mouth 2 (two) times daily.   NASAL SALINE NA Place into the nose. [facility] brand   rasagiline (AZILECT) 1 MG TABS tablet Take 1 tablet (1 mg total) by mouth daily.   terazosin (HYTRIN) 2 MG capsule TAKE (1) CAPSULE DAILY AT BEDTIME.   VIRT-GARD 2.2-25-1 MG TABS TAKE 1 TABLET DAILY     Allergies:   Aspirin; Aspirin; Calicum glycerophosphate; Niaspan [niacin er]; and Zocor [simvastatin]   Social History   Tobacco Use   Smoking status: Never Smoker   Smokeless tobacco: Never Used  Substance Use Topics   Alcohol use: Never    Frequency: Never   Drug use: Never     Family Hx: The patient's family history includes Alcoholism in his brother; Heart attack in his brother; Heart disease in his father; Hyperlipidemia in his son; Kidney failure in his father. There is no history of Cancer.  ROS:   Please see the history of present illness.    As stated in the HPI and negative for all other systems.   Prior CV studies:   The following studies were reviewed today:   Labs/Other Tests and Data Reviewed:    EKG:  Atrial fibrillation, rate 86, nonspecific ST-T wave changes, 09/01/2018  Recent Labs: 09/30/2018: ALT 25; BUN 33; Creatinine, Ser 1.50; Hemoglobin 12.1; Platelets 176; Potassium 3.8; Sodium 139; TSH 4.270 11/02/2018: BNP 1,000.8   Recent Lipid Panel Lab Results  Component Value Date/Time   CHOL 114 07/24/2018 03:52 PM   CHOL 127 02/06/2013 08:21 AM   TRIG 54 07/24/2018 03:52 PM   TRIG 72 05/08/2017 08:15 AM   TRIG 59 02/06/2013 08:21 AM   HDL 56 07/24/2018 03:52 PM   HDL 52 05/08/2017 08:15 AM   HDL 48 02/06/2013 08:21 AM   CHOLHDL 2.0 07/24/2018 03:52 PM   LDLCALC 47 07/24/2018 03:52 PM   LDLCALC 76 03/19/2014 08:16 AM   LDLCALC 67 02/06/2013 08:21 AM    Wt Readings from Last 3 Encounters:  01/08/19 153 lb (69.4 kg)  11/02/18 155 lb (70.3 kg)  10/16/18 152 lb (68.9 kg)     Objective:    Vital Signs:  BP 125/65 (BP Location: Left Arm, Patient Position:  Sitting, Cuff Size: Normal)    Pulse 68    Ht 5' 8.5" (1.74 m)    Wt 153 lb (69.4 kg)    BMI 22.93 kg/m    VITAL SIGNS:  reviewed  ASSESSMENT & PLAN:    CHRONIC ATRIAL FIB:    He tolerates anticoagulation and has had good rate control.  No change in therapy.  HTN: Blood pressure is controlled.  No change in therapy.  CKD III:  Creat was 1.5 which was baseline.  This was  checked in February.  This should be checked again given his treatment with diuretics.  DYSLIPIDEMIA:    Per Dr. Laurance Flatten.     MR/AI:    I will follow with an echo as above.    COVID-19 Education: The signs and symptoms of COVID-19 were discussed with the patient and how to seek care for testing (follow up with PCP or arrange E-visit).   The importance of social distancing was discussed today.  Time:   Today, I have spent 25 minutes with the patient with telehealth technology discussing the above problems.     Medication Adjustments/Labs and Tests Ordered: Current medicines are reviewed at length with the patient today.  Concerns regarding medicines are outlined above.   Tests Ordered: No orders of the defined types were placed in this encounter.   Medication Changes: No orders of the defined types were placed in this encounter.   Disposition:  Follow up with me in 2 months  Signed, Minus Breeding, MD  01/08/2019 10:08 AM    [place]

## 2019-01-08 ENCOUNTER — Telehealth: Payer: Self-pay | Admitting: Cardiology

## 2019-01-08 ENCOUNTER — Telehealth (INDEPENDENT_AMBULATORY_CARE_PROVIDER_SITE_OTHER): Payer: Medicare Other | Admitting: Cardiology

## 2019-01-08 ENCOUNTER — Other Ambulatory Visit: Payer: Medicare Other

## 2019-01-08 ENCOUNTER — Encounter: Payer: Self-pay | Admitting: Cardiology

## 2019-01-08 ENCOUNTER — Other Ambulatory Visit: Payer: Self-pay

## 2019-01-08 ENCOUNTER — Encounter

## 2019-01-08 VITALS — BP 125/65 | HR 68 | Ht 68.5 in | Wt 153.0 lb

## 2019-01-08 DIAGNOSIS — G8929 Other chronic pain: Secondary | ICD-10-CM | POA: Diagnosis not present

## 2019-01-08 DIAGNOSIS — I950 Idiopathic hypotension: Secondary | ICD-10-CM | POA: Diagnosis not present

## 2019-01-08 DIAGNOSIS — G20 Parkinson's disease: Secondary | ICD-10-CM | POA: Diagnosis not present

## 2019-01-08 DIAGNOSIS — I10 Essential (primary) hypertension: Secondary | ICD-10-CM | POA: Diagnosis not present

## 2019-01-08 DIAGNOSIS — Z79899 Other long term (current) drug therapy: Secondary | ICD-10-CM

## 2019-01-08 DIAGNOSIS — I25118 Atherosclerotic heart disease of native coronary artery with other forms of angina pectoris: Secondary | ICD-10-CM

## 2019-01-08 DIAGNOSIS — M5136 Other intervertebral disc degeneration, lumbar region: Secondary | ICD-10-CM | POA: Diagnosis not present

## 2019-01-08 DIAGNOSIS — I482 Chronic atrial fibrillation, unspecified: Secondary | ICD-10-CM

## 2019-01-08 DIAGNOSIS — Z7189 Other specified counseling: Secondary | ICD-10-CM

## 2019-01-08 DIAGNOSIS — M5030 Other cervical disc degeneration, unspecified cervical region: Secondary | ICD-10-CM | POA: Diagnosis not present

## 2019-01-08 MED ORDER — METOPROLOL TARTRATE 50 MG PO TABS: 25.0000 mg | ORAL_TABLET | Freq: Two times a day (BID) | ORAL | 3 refills | Status: AC

## 2019-01-08 MED ORDER — METOPROLOL SUCCINATE ER 25 MG PO TB24: 25.0000 mg | ORAL_TABLET | Freq: Every day | ORAL | 3 refills | Status: DC

## 2019-01-08 NOTE — Patient Instructions (Signed)
Medication Instructions:  The current medical regimen is effective;  continue present plan and medications.  If you need a refill on your cardiac medications before your next appointment, please call your pharmacy.   Lab work: Please have blood work today at [facility]  (BMP) If you have labs (blood work) drawn today and your tests are completely normal, you will receive your results only by: Marland Kitchen MyChart Message (if you have MyChart) OR . A paper copy in the mail If you have any lab test that is abnormal or we need to change your treatment, we will call you to review the results.  Testing/Procedures: Your physician has requested that you have an echocardiogram.This is due in July 2020 before seeing Dr Percival Spanish back.  You will be called to schedule this appointment.  Echocardiography is a painless test that uses sound waves to create images of your heart. It provides your doctor with information about the size and shape of your heart and how well your heart's chambers and valves are working. This procedure takes approximately one hour. There are no restrictions for this procedure.  Follow-Up: Please see Dr Percival Spanish in 2 months in [place].  Thank you for choosing [facility]!!

## 2019-01-08 NOTE — Telephone Encounter (Signed)
Disregard opened in error °

## 2019-01-08 NOTE — Progress Notes (Signed)
At Dr. Rosezella Florida recommendation please get repeat BMP on this patient since taking diuretic.

## 2019-01-09 LAB — BASIC METABOLIC PANEL
BUN/Creatinine Ratio: 15 (ref 10–24)
BUN: 20 mg/dL (ref 8–27)
CO2: 25 mmol/L (ref 20–29)
Calcium: 9.2 mg/dL (ref 8.6–10.2)
Chloride: 103 mmol/L (ref 96–106)
Creatinine, Ser: 1.34 mg/dL — ABNORMAL HIGH (ref 0.76–1.27)
GFR calc Af Amer: 55 mL/min/{1.73_m2} — ABNORMAL LOW (ref >59)
GFR calc non Af Amer: 48 mL/min/{1.73_m2} — ABNORMAL LOW (ref >59)
Glucose: 114 mg/dL — ABNORMAL HIGH (ref 65–99)
Potassium: 4 mmol/L (ref 3.5–5.2)
Sodium: 142 mmol/L (ref 134–144)

## 2019-01-15 DIAGNOSIS — I950 Idiopathic hypotension: Secondary | ICD-10-CM | POA: Diagnosis not present

## 2019-01-15 DIAGNOSIS — M5030 Other cervical disc degeneration, unspecified cervical region: Secondary | ICD-10-CM | POA: Diagnosis not present

## 2019-01-15 DIAGNOSIS — G20 Parkinson's disease: Secondary | ICD-10-CM | POA: Diagnosis not present

## 2019-01-15 DIAGNOSIS — G8929 Other chronic pain: Secondary | ICD-10-CM | POA: Diagnosis not present

## 2019-01-15 DIAGNOSIS — I10 Essential (primary) hypertension: Secondary | ICD-10-CM | POA: Diagnosis not present

## 2019-01-15 DIAGNOSIS — M5136 Other intervertebral disc degeneration, lumbar region: Secondary | ICD-10-CM | POA: Diagnosis not present

## 2019-01-17 ENCOUNTER — Emergency Department (HOSPITAL_COMMUNITY)
Admission: EM | Admit: 2019-01-17 | Discharge: 2019-01-17 | Disposition: A | Discharge status: Home / Self Care | Payer: Medicare Other | Attending: Emergency Medicine | Admitting: Emergency Medicine

## 2019-01-17 ENCOUNTER — Encounter (HOSPITAL_COMMUNITY): Payer: Self-pay | Admitting: Emergency Medicine

## 2019-01-17 ENCOUNTER — Emergency Department (HOSPITAL_COMMUNITY): Payer: Medicare Other

## 2019-01-17 ENCOUNTER — Other Ambulatory Visit: Payer: Self-pay

## 2019-01-17 DIAGNOSIS — Y9301 Activity, walking, marching and hiking: Secondary | ICD-10-CM | POA: Insufficient documentation

## 2019-01-17 DIAGNOSIS — G20 Parkinson's disease: Secondary | ICD-10-CM | POA: Insufficient documentation

## 2019-01-17 DIAGNOSIS — Z23 Encounter for immunization: Secondary | ICD-10-CM | POA: Insufficient documentation

## 2019-01-17 DIAGNOSIS — S0990XA Unspecified injury of head, initial encounter: Secondary | ICD-10-CM

## 2019-01-17 DIAGNOSIS — Y92018 Other place in single-family (private) house as the place of occurrence of the external cause: Secondary | ICD-10-CM | POA: Diagnosis not present

## 2019-01-17 DIAGNOSIS — I129 Hypertensive chronic kidney disease with stage 1 through stage 4 chronic kidney disease, or unspecified chronic kidney disease: Secondary | ICD-10-CM | POA: Insufficient documentation

## 2019-01-17 DIAGNOSIS — N183 Chronic kidney disease, stage 3 (moderate): Secondary | ICD-10-CM | POA: Insufficient documentation

## 2019-01-17 DIAGNOSIS — S0991XA Unspecified injury of ear, initial encounter: Secondary | ICD-10-CM | POA: Diagnosis not present

## 2019-01-17 DIAGNOSIS — Z7901 Long term (current) use of anticoagulants: Secondary | ICD-10-CM | POA: Insufficient documentation

## 2019-01-17 DIAGNOSIS — S199XXA Unspecified injury of neck, initial encounter: Secondary | ICD-10-CM | POA: Diagnosis not present

## 2019-01-17 DIAGNOSIS — I2510 Atherosclerotic heart disease of native coronary artery without angina pectoris: Secondary | ICD-10-CM | POA: Diagnosis not present

## 2019-01-17 DIAGNOSIS — S0181XA Laceration without foreign body of other part of head, initial encounter: Secondary | ICD-10-CM | POA: Diagnosis not present

## 2019-01-17 DIAGNOSIS — Z79899 Other long term (current) drug therapy: Secondary | ICD-10-CM | POA: Insufficient documentation

## 2019-01-17 DIAGNOSIS — W010XXA Fall on same level from slipping, tripping and stumbling without subsequent striking against object, initial encounter: Secondary | ICD-10-CM | POA: Insufficient documentation

## 2019-01-17 DIAGNOSIS — Y999 Unspecified external cause status: Secondary | ICD-10-CM | POA: Insufficient documentation

## 2019-01-17 DIAGNOSIS — W19XXXA Unspecified fall, initial encounter: Secondary | ICD-10-CM

## 2019-01-17 MED ORDER — BACITRACIN ZINC 500 UNIT/GM EX OINT
TOPICAL_OINTMENT | CUTANEOUS | Status: AC
  Filled 2019-01-17: qty 1.8

## 2019-01-17 MED ORDER — POVIDONE-IODINE 10 % EX SOLN
CUTANEOUS | Status: AC
  Filled 2019-01-17: qty 15

## 2019-01-17 MED ORDER — LIDOCAINE-EPINEPHRINE (PF) 1 %-1:200000 IJ SOLN
10.0000 mL | Freq: Once | INTRAMUSCULAR | Status: DC
  Filled 2019-01-17: qty 30

## 2019-01-17 MED ORDER — TETANUS-DIPHTH-ACELL PERTUSSIS 5-2.5-18.5 LF-MCG/0.5 IM SUSP
0.5000 mL | Freq: Once | INTRAMUSCULAR | Status: AC
  Administered 2019-01-17: 0.5 mL via INTRAMUSCULAR
  Filled 2019-01-17: qty 0.5

## 2019-01-17 NOTE — ED Triage Notes (Signed)
[address] going hall and tripped over rug.  Hit head on tile.  Denies LOC and denies dizziness.  No fever.  No other injury per pt.

## 2019-01-17 NOTE — ED Provider Notes (Addendum)
[facility] EMERGENCY DEPARTMENT Provider Note   CSN: [id number] Arrival date & time: 01/17/19  1621    History   Chief Complaint Chief Complaint  Patient presents with   Fall    HPI Keith Hill is a 85 y.o. male.     HPI   He presents for evaluation of injury to face.  Injury sustained when walking in his home, in the hallway, tripped on a rug and fell face forward.  There is no prodrome.  He is able to ambulate afterwards.  He denies neck pain, back pain, nausea, vomiting, shortness of breath, chest pain, weakness or dizziness.  There are no other known modifying factors.  Past Medical History:  Diagnosis Date   Allergic rhinitis, cause unspecified    Cardiac dysrhythmia, unspecified    Colon polyps    Essential and other specified forms of tremor    GERD (gastroesophageal reflux disease)    Hemorrhoids, external    Hiatal hernia    Hyperlipidemia    Hyperplasia of prostate    Hypertension    Lumbar disc disease    Pancreatitis 1976   Paralysis agitans ([place]) 11/07/2012   Parkinson disease ([place])    Peyronie's disease    Prostate cancer [facility])    prostate    Patient Active Problem List   Diagnosis Date Noted   Chronic atrial fibrillation 08/20/2018   Chronic anticoagulation 08/20/2018   Preoperative cardiovascular examination 08/20/2018   Benign hypertension with chronic kidney disease, stage III ([place]) 12/19/2017   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, 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[place]) 10/29/2017   Aortic atherosclerosis ([place]) 10/29/2017   Spondylosis without myelopathy or radiculopathy, lumbar region 12/05/2016   Back pain 11/18/2013   Vitamin D deficiency 08/28/2013   Hiatal hernia with gastroesophageal reflux 08/28/2013   CAD (coronary artery disease) 04/08/2013   Essential and other specified forms of tremor 03/31/2013   Parkinson disease ([place]) 11/07/2012   Hemorrhoids 02/17/2011   Hyperlipidemia with  target LDL less than 70    Prostate cancer ([place])    Hypertension    Allergic rhinitis    Colon polyps, history of     Past Surgical History:  Procedure Laterality Date   CATARACT EXTRACTION     EYE SURGERY     HEMORRHOID SURGERY     KNEE SURGERY Left    PROSTATE BIOPSY  04/03/2013   PROSTATE BIOPSY  11/01/2011   SHOULDER SURGERY Left         Home Medications    Prior to Admission medications   Medication Sig Start Date End Date Taking? Authorizing Provider  acetaminophen (TYLENOL) 500 MG tablet Take 500 mg by mouth every 6 (six) hours as needed.    [provider]  atorvastatin (LIPITOR) 20 MG tablet TAKE (1) TABLET DAILY AS DIRECTED. 08/22/18   Chipper Herb, MD  carbidopa-levodopa (SINEMET IR) 25-100 MG tablet TAKE 1&1/2 TABLETS IN THE MORNING AND 1 AT [facility] AND EVENING 03/21/18   Kathrynn Ducking, MD  Cholecalciferol (VITAMIN D3) 5000 UNITS CAPS Take 1 capsule by mouth 3 (three) times a week. Reported on 09/14/2015    [provider]  docusate sodium (COLACE) 100 MG capsule Take 100 mg by mouth daily as needed. daily    [provider]  doxylamine, Sleep, (UNISOM) 25 MG tablet Take 12.5 mg by mouth at bedtime as needed.    [provider]  ELIQUIS 5 MG TABS tablet TAKE (1) TABLET TWICE A DAY.  07/01/18   Chipper Herb, MD  guaiFENesin (MUCINEX) 600 MG 12 hr tablet Take 600 mg by mouth 2 (two) times daily as needed.    [provider]  [name] Oil 300 MG CAPS Take 500 mg by mouth daily. Reported on 09/14/2015    [provider]  Magnesium 250 MG TABS Take 2 tablets by mouth at bedtime as needed.     [provider]  metoprolol tartrate (LOPRESSOR) 50 MG tablet Take 0.5 tablets (25 mg total) by mouth 2 (two) times daily. 01/08/19   Minus Breeding, MD  Multiple Vitamins-Minerals (PRESERVISION AREDS 2 PO) Take 1 tablet by mouth daily.     [provider]  NASAL SALINE NA Place into the nose. [employer] brand     [provider]  rasagiline (AZILECT) 1 MG TABS tablet Take 1 tablet (1 mg total) by mouth daily. 09/26/18   Kathrynn Ducking, MD  terazosin (HYTRIN) 2 MG capsule TAKE (1) CAPSULE DAILY AT BEDTIME. 07/09/18   Chipper Herb, MD  VIRT-GARD 2.2-25-1 MG TABS TAKE 1 TABLET DAILY 11/11/18   Chipper Herb, MD    Family History Family History  Problem Relation Age of Onset   Heart disease Father    Kidney failure Father    Alcoholism Brother    Heart attack Brother    Hyperlipidemia Son    Cancer Neg Hx     Social History Social History   Tobacco Use   Smoking status: Never Smoker   Smokeless tobacco: Never Used  Substance Use Topics   Alcohol use: Never    Frequency: Never   Drug use: Never     Allergies   Aspirin; Aspirin; Calicum glycerophosphate; Niaspan [niacin er]; and Zocor [simvastatin]   Review of Systems Review of Systems  All other systems reviewed and are negative.    Physical Exam Updated Vital Signs BP (!) 160/101 (BP Location: Left Arm)    Pulse (!) 110    Temp 98.2 F (36.8 C) (Oral)    Resp 16    Ht 5\' 8"  (1.727 m)    Wt 69.4 kg    SpO2 99%    BMI 23.26 kg/m   Physical Exam Vitals signs and nursing note reviewed.  Constitutional:      General: He is not in acute distress.    Appearance: Normal appearance. He is well-developed and normal weight. He is not ill-appearing, toxic-appearing or diaphoretic.  HENT:     Head: Normocephalic.     Comments: Contusion and laceration left forehead, above the eyebrow.  No associated crepitation or deformity.  No trismus.    Right Ear: External ear normal.     Left Ear: External ear normal.     Nose: Nose normal.     Mouth/Throat:     Mouth: Mucous membranes are moist.     Pharynx: No oropharyngeal exudate or posterior oropharyngeal erythema.  Eyes:     Conjunctiva/sclera: Conjunctivae normal.     Pupils: Pupils are equal, round, and reactive to light.  Neck:     Musculoskeletal: Normal  range of motion and neck supple.     Trachea: Phonation normal.  Cardiovascular:     Rate and Rhythm: Normal rate and regular rhythm.     Heart sounds: Normal heart sounds.  Pulmonary:     Effort: Pulmonary effort is normal.     Breath sounds: Normal breath sounds.  Abdominal:     General: There is no distension.  Palpations: Abdomen is soft.     Tenderness: There is no abdominal tenderness.  Musculoskeletal: Normal range of motion.  Skin:    General: Skin is warm and dry.  Neurological:     Mental Status: He is alert and oriented to person, place, and time.     Cranial Nerves: No cranial nerve deficit.     Sensory: No sensory deficit.     Motor: No abnormal muscle tone.     Coordination: Coordination normal.  Psychiatric:        Mood and Affect: Mood normal.        Behavior: Behavior normal.        Thought Content: Thought content normal.        Judgment: Judgment normal.      ED Treatments / Results  Labs (all labs ordered are listed, but only abnormal results are displayed) Labs Reviewed - No data to display  EKG None  Radiology Ct Head Wo Contrast  Result Date: 01/17/2019 CLINICAL DATA:  [address] with trauma to the head. EXAM: CT HEAD WITHOUT CONTRAST CT CERVICAL SPINE WITHOUT CONTRAST TECHNIQUE: Multidetector CT imaging of the head and cervical spine was performed following the standard protocol without intravenous contrast. Multiplanar CT image reconstructions of the cervical spine were also generated. COMPARISON:  09/01/2018 FINDINGS: CT HEAD FINDINGS Brain: Age related atrophy. Mild chronic small-vessel ischemic change of the white matter. No sign of acute infarction, mass lesion, hemorrhage, hydrocephalus or extra-axial collection. Vascular: There is atherosclerotic calcification of the major vessels at the base of the brain. Skull: Negative Sinuses/Orbits: Clear/no Other: None CT CERVICAL SPINE FINDINGS Alignment: No traumatic malalignment. Skull base and vertebrae:  No fracture. Soft tissues and spinal canal: No traumatic finding. Disc levels: Ordinary mild spondylosis from C3-4 through C6-7. Facet osteoarthritis left more than right from C2-3 through C5-6. Upper chest: Negative Other: None IMPRESSION: Head CT: No acute or traumatic finding. Atrophy and chronic small-vessel ischemic changes. Vascular calcification. Cervical spine CT: No acute or traumatic finding. Ordinary mild cervical spondylosis and left-sided facet osteoarthritis. Electronically Signed   By: Nelson Chimes M.D.   On: 01/17/2019 18:11   Ct Cervical Spine Wo Contrast  Result Date: 01/17/2019 CLINICAL DATA:  [address] with trauma to the head. EXAM: CT HEAD WITHOUT CONTRAST CT CERVICAL SPINE WITHOUT CONTRAST TECHNIQUE: Multidetector CT imaging of the head and cervical spine was performed following the standard protocol without intravenous contrast. Multiplanar CT image reconstructions of the cervical spine were also generated. COMPARISON:  09/01/2018 FINDINGS: CT HEAD FINDINGS Brain: Age related atrophy. Mild chronic small-vessel ischemic change of the white matter. No sign of acute infarction, mass lesion, hemorrhage, hydrocephalus or extra-axial collection. Vascular: There is atherosclerotic calcification of the major vessels at the base of the brain. Skull: Negative Sinuses/Orbits: Clear/no Other: None CT CERVICAL SPINE FINDINGS Alignment: No traumatic malalignment. Skull base and vertebrae: No fracture. Soft tissues and spinal canal: No traumatic finding. Disc levels: Ordinary mild spondylosis from C3-4 through C6-7. Facet osteoarthritis left more than right from C2-3 through C5-6. Upper chest: Negative Other: None IMPRESSION: Head CT: No acute or traumatic finding. Atrophy and chronic small-vessel ischemic changes. Vascular calcification. Cervical spine CT: No acute or traumatic finding. Ordinary mild cervical spondylosis and left-sided facet osteoarthritis. Electronically Signed   By: Nelson Chimes M.D.   On:  01/17/2019 18:11    Procedures .Marland KitchenLaceration Repair Date/Time: 01/17/2019 7:18 PM Performed by: Daleen Bo, MD Authorized by: Daleen Bo, MD   Consent:    Consent obtained:  Verbal   Consent given by:  Patient   Risks discussed:  Pain, need for additional repair and poor wound healing   Alternatives discussed:  No treatment Anesthesia (see MAR for exact dosages):    Anesthesia method:  Local infiltration   Local anesthetic:  Lidocaine 1% WITH epi Laceration details:    Location:  Face   Face location:  Forehead   Length (cm):  3   Depth (mm):  9 Repair type:    Repair type:  Simple Pre-procedure details:    Preparation:  Patient was prepped and draped in usual sterile fashion and imaging obtained to evaluate for foreign bodies Exploration:    Hemostasis achieved with:  Epinephrine and direct pressure   Wound exploration: wound explored through full range of motion     Wound extent: no areolar tissue violation noted, no fascia violation noted, no foreign bodies/material noted, no muscle damage noted and no underlying fracture noted     Contaminated: no   Treatment:    Area cleansed with:  Betadine   Amount of cleaning:  Standard   Irrigation solution:  Sterile saline   Irrigation volume:  50   Irrigation method:  Syringe   Visualized foreign bodies/material removed: no   Skin repair:    Repair method:  Sutures   Suture size:  4-0   Suture material:  Prolene   Suture technique:  Simple interrupted   Number of sutures:  5 Approximation:    Approximation:  Close Post-procedure details:    Dressing:  Antibiotic ointment and non-adherent dressing   Patient tolerance of procedure:  Tolerated well, no immediate complications   (including critical care time)  Medications Ordered in ED Medications  lidocaine-EPINEPHrine (XYLOCAINE-EPINEPHrine) 1 %-1:200000 (PF) injection 10 mL (has no administration in time range)  povidone-iodine (BETADINE) 10 % external solution  (has no administration in time range)  bacitracin 500 UNIT/GM ointment (has no administration in time range)  Tdap (BOOSTRIX) injection 0.5 mL (0.5 mLs Intramuscular Given 01/17/19 1659)     Initial Impression / Assessment and Plan / ED Course  I have reviewed the triage vital signs and the nursing notes.  Pertinent labs & imaging results that were available during my care of the patient were reviewed by me and considered in my medical decision making (see chart for details).  Clinical Course as of Jan 16 1918  Fri Jan 17, 2019  1846 I updated the patient's son, Orrie Schubert on the status and plan for his dad.  He is waiting outside, currently.   [EW]    Clinical Course User Index [EW] Daleen Bo, MD        Patient Vitals for the past 24 hrs:  BP Temp Temp src Pulse Resp SpO2 Height Weight  01/17/19 1839 (!) 160/101 98.2 F (36.8 C) Oral (!) 110 16 99 % -- --  01/17/19 1815 -- -- -- (!) 59 (!) 22 100 % -- --  01/17/19 1800 -- -- -- 93 (!) 21 100 % -- --  01/17/19 1654 131/80 -- -- 95 18 98 % -- --  01/17/19 1634 -- -- -- -- -- -- 5\' 8"  (1.727 m) 69.4 kg  01/17/19 1633 (!) 130/94 97.8 F (36.6 C) Oral 94 16 98 % -- --    7:19 PM Reevaluation with update and discussion. After initial assessment and treatment, an updated evaluation reveals he remains comfortable and calm.  Findings discussed and questions answered. Daleen Bo   Medical Decision Making: Fall, mechanical, with  forehead injury.  No evidence for intracranial or cervical injuries.  Patient does not require additional evaluation ED or hospitalization, at this time  CRITICAL CARE-no Performed by: Daleen Bo  Nursing Notes Reviewed/ Care Coordinated Applicable Imaging Reviewed Interpretation of Laboratory Data incorporated into ED treatment  The patient appears reasonably screened and/or stabilized for discharge and I doubt any other medical condition or other [facility] requiring further screening, evaluation,  or treatment in the ED at this time prior to discharge.  Plan: Home Medications-resume usual medications and use Tylenol for pain; Home Treatments-wound care at home, head injury precautions to be followed, verbally given by me; return here if the recommended treatment, does not improve the symptoms; Recommended follow up-PCP 7 days for suture removal, return here if needed   Final Clinical Impressions(s) / ED Diagnoses   Final diagnoses:  Facial laceration, initial encounter  Injury of head, initial encounter  Fall, initial encounter    ED Discharge Orders    None       Daleen Bo, MD 01/17/19 Lurena Nida    Daleen Bo, MD 01/27/19 1510

## 2019-01-17 NOTE — Discharge Instructions (Addendum)
Keep the wound clean with soap and water daily.  It may bleed so you probably need to keep a bandage on it.  Return here if needed for problems.

## 2019-01-17 NOTE — ED Notes (Signed)
Pt on Eliquis

## 2019-01-17 NOTE — ED Notes (Signed)
From CT 

## 2019-01-20 ENCOUNTER — Telehealth: Payer: Self-pay | Admitting: Family Medicine

## 2019-01-20 DIAGNOSIS — Z85828 Personal history of other malignant neoplasm of skin: Secondary | ICD-10-CM | POA: Diagnosis not present

## 2019-01-20 DIAGNOSIS — L821 Other seborrheic keratosis: Secondary | ICD-10-CM | POA: Diagnosis not present

## 2019-01-20 NOTE — Telephone Encounter (Signed)
Apt made for suture removal.

## 2019-01-21 ENCOUNTER — Other Ambulatory Visit: Payer: Self-pay

## 2019-01-22 ENCOUNTER — Telehealth: Payer: Self-pay | Admitting: Family Medicine

## 2019-01-22 NOTE — Telephone Encounter (Signed)
Pt son called - appt for tomorrow moved up in time - jhb

## 2019-01-23 ENCOUNTER — Encounter: Payer: Self-pay | Admitting: Family Medicine

## 2019-01-23 ENCOUNTER — Encounter: Payer: Medicare Other | Admitting: *Deleted

## 2019-01-23 ENCOUNTER — Other Ambulatory Visit: Payer: Self-pay

## 2019-01-23 ENCOUNTER — Ambulatory Visit (INDEPENDENT_AMBULATORY_CARE_PROVIDER_SITE_OTHER): Payer: Medicare Other

## 2019-01-23 ENCOUNTER — Ambulatory Visit (INDEPENDENT_AMBULATORY_CARE_PROVIDER_SITE_OTHER): Payer: Medicare Other | Admitting: Family Medicine

## 2019-01-23 VITALS — BP 138/84 | HR 107 | Temp 97.8°F | Ht 68.0 in | Wt 150.0 lb

## 2019-01-23 DIAGNOSIS — Z4802 Encounter for removal of sutures: Secondary | ICD-10-CM

## 2019-01-23 DIAGNOSIS — R0602 Shortness of breath: Secondary | ICD-10-CM

## 2019-01-23 NOTE — Patient Instructions (Signed)
Wound Care, Adult  Taking care of your wound properly can help to prevent pain, infection, and scarring. It can also help your wound to heal more quickly.  How to care for your wound  Wound care          Follow instructions from your health care provider about how to take care of your wound. Make sure you:  ? Wash your hands with soap and water before you change the bandage (dressing). If soap and water are not available, use hand sanitizer.  ? Change your dressing as told by your health care provider.  ? Leave stitches (sutures), skin glue, or adhesive strips in place. These skin closures may need to stay in place for 2 weeks or longer. If adhesive strip edges start to loosen and curl up, you may trim the loose edges. Do not remove adhesive strips completely unless your health care provider tells you to do that.   Check your wound area every day for signs of infection. Check for:  ? Redness, swelling, or pain.  ? Fluid or blood.  ? Warmth.  ? Pus or a bad smell.   Ask your health care provider if you should clean the wound with mild soap and water. Doing this may include:  ? Using a clean towel to pat the wound dry after cleaning it. Do not rub or scrub the wound.  ? Applying a cream or ointment. Do this only as told by your health care provider.  ? Covering the incision with a clean dressing.   Ask your health care provider when you can leave the wound uncovered.   Keep the dressing dry until your health care provider says it can be removed. Do not take baths, swim, use a hot tub, or do anything that would put the wound underwater until your health care provider approves. Ask your health care provider if you can take showers. You may only be allowed to take sponge baths.  Medicines     If you were prescribed an antibiotic medicine, cream, or ointment, take or use the antibiotic as told by your health care provider. Do not stop taking or using the antibiotic even if your condition improves.   Take  over-the-counter and prescription medicines only as told by your health care provider. If you were prescribed pain medicine, take it 30 or more minutes before you do any wound care or as told by your health care provider.  General instructions   Return to your normal activities as told by your health care provider. Ask your health care provider what activities are safe.   Do not scratch or pick at the wound.   Do not use any products that contain nicotine or tobacco, such as cigarettes and e-cigarettes. These may delay wound healing. If you need help quitting, ask your health care provider.   Keep all follow-up visits as told by your health care provider. This is important.   Eat a diet that includes protein, vitamin A, vitamin C, and other nutrient-rich foods to help the wound heal.  ? Foods rich in protein include meat, dairy, beans, nuts, and other sources.  ? Foods rich in vitamin A include carrots and dark green, leafy vegetables.  ? Foods rich in vitamin C include citrus, tomatoes, and other fruits and vegetables.  ? Nutrient-rich foods have protein, carbohydrates, fat, vitamins, or minerals. Eat a variety of healthy foods including vegetables, fruits, and whole grains.  Contact a health care provider if:     You received a tetanus shot and you have swelling, severe pain, redness, or bleeding at the injection site.   Your pain is not controlled with medicine.   You have redness, swelling, or pain around the wound.   You have fluid or blood coming from the wound.   Your wound feels warm to the touch.   You have pus or a bad smell coming from the wound.   You have a fever or chills.   You are nauseous or you vomit.   You are dizzy.  Get help right away if:   You have a red streak going away from your wound.   The edges of the wound open up and separate.   Your wound is bleeding, and the bleeding does not stop with gentle pressure.   You have a rash.   You faint.   You have trouble  breathing.  Summary   Always wash your hands with soap and water before changing your bandage (dressing).   To help with healing, eat foods that are rich in protein, vitamin A, vitamin C, and other nutrients.   Check your wound every day for signs of infection. Contact your health care provider if you suspect that your wound is infected.  This information is not intended to replace advice given to you by your health care provider. Make sure you discuss any questions you have with your health care provider.  Document Released: 05/16/2008 Document Revised: 09/18/2017 Document Reviewed: 02/22/2016  [employer]vier Interactive Patient Education  2019 [employer]

## 2019-01-23 NOTE — Progress Notes (Signed)
Subjective:  Patient ID: Keith Hill, male    DOB: [birth year], 85 y.o.   MRN: [id number]  Chief Complaint:  Suture / Staple Removal   HPI: Keith Hill is a 85 y.o. male presenting on 01/23/2019 for Suture / Staple Removal  Pt presents today for suture removal. Pt fell on 01/17/2019 injuring his face. He was seen in the ED after the fall and the laceration to his right forehead was repaired with 5 sutures. CT of neck and head were normal. Pt denies pain, bleeding, or drainage.  Pt also reports intermittent shortness of breath. States this is worse when he is worrying about things. No chest pain, fever, cough, sputum production, weakness, or confusion with the shortness of breath. No palpitations or changes in weight.   Relevant past medical, surgical, family, and social history reviewed and updated as indicated.  Allergies and medications reviewed and updated.   Past Medical History:  Diagnosis Date  . Allergic rhinitis, cause unspecified   . Cardiac dysrhythmia, unspecified   . Colon polyps   . Essential and other specified forms of tremor   . GERD (gastroesophageal reflux disease)   . Hemorrhoids, external   . Hiatal hernia   . Hyperlipidemia   . Hyperplasia of prostate   . Hypertension   . Lumbar disc disease   . Pancreatitis 1976  . Paralysis agitans ([place]) 11/07/2012  . Parkinson disease ([place])   . Peyronie's disease   . Prostate cancer [facility])    prostate    Past Surgical History:  Procedure Laterality Date  . CATARACT EXTRACTION    . EYE SURGERY    . HEMORRHOID SURGERY    . KNEE SURGERY Left   . PROSTATE BIOPSY  04/03/2013  . PROSTATE BIOPSY  11/01/2011  . SHOULDER SURGERY Left     Social History   Socioeconomic History  . Marital status: Married    Spouse name: [name]  . Number of children: 1  . Years of education: College  . Highest education level: Not on file  Occupational History  . Occupation: retired    Comment: Building control surveyor: RETIRED  .  Occupation: retired  Scientific laboratory technician  . Financial resource strain: Not hard at all  . Food insecurity:    Worry: Never true    Inability: Never true  . Transportation needs:    Medical: No    Non-medical: No  Tobacco Use  . Smoking status: Never Smoker  . Smokeless tobacco: Never Used  Substance and Sexual Activity  . Alcohol use: Never    Frequency: Never  . Drug use: Never  . Sexual activity: Yes  Lifestyle  . Physical activity:    Days per week: 0 days    Minutes per session: 0 min  . Stress: Not at all  Relationships  . Social connections:    Talks on phone: More than three times a week    Gets together: More than three times a week    Attends religious service: More than 4 times per year    Active member of club or organization: Yes    Attends meetings of clubs or organizations: More than 4 times per year    Relationship status: Married  . Intimate partner violence:    Fear of current or ex partner: No    Emotionally abused: No    Physically abused: No    Forced sexual activity: No  Other Topics Concern  . Not on  file  Social History Narrative   ** Merged History Encounter **       Patient lives at home with spouse. Caffeine Use: 2-3 sodas daily Patient is right handed.    Outpatient Encounter Medications as of 01/23/2019  Medication Sig  . acetaminophen (TYLENOL) 500 MG tablet Take 500 mg by mouth every 6 (six) hours as needed.  Marland Kitchen atorvastatin (LIPITOR) 20 MG tablet TAKE (1) TABLET DAILY AS DIRECTED.  . carbidopa-levodopa (SINEMET IR) 25-100 MG tablet TAKE 1&1/2 TABLETS IN THE MORNING AND 1 AT NOON AND EVENING  . Cholecalciferol (VITAMIN D3) 5000 UNITS CAPS Take 1 capsule by mouth 3 (three) times a week. Reported on 09/14/2015  . docusate sodium (COLACE) 100 MG capsule Take 100 mg by mouth daily as needed. daily  . doxylamine, Sleep, (UNISOM) 25 MG tablet Take 12.5 mg by mouth at bedtime as needed.  Marland Kitchen ELIQUIS 5 MG TABS tablet TAKE (1) TABLET TWICE A DAY.  Marland Kitchen  guaiFENesin (MUCINEX) 600 MG 12 hr tablet Take 600 mg by mouth 2 (two) times daily as needed.  [name] Oil 300 MG CAPS Take 500 mg by mouth daily. Reported on 09/14/2015  . Magnesium 250 MG TABS Take 2 tablets by mouth at bedtime as needed.   . metoprolol tartrate (LOPRESSOR) 50 MG tablet Take 0.5 tablets (25 mg total) by mouth 2 (two) times daily.  . Multiple Vitamins-Minerals (PRESERVISION AREDS 2 PO) Take 1 tablet by mouth daily.   Marland Kitchen NASAL SALINE NA Place into the nose. [employer] brand  . rasagiline (AZILECT) 1 MG TABS tablet Take 1 tablet (1 mg total) by mouth daily.  Marland Kitchen terazosin (HYTRIN) 2 MG capsule TAKE (1) CAPSULE DAILY AT BEDTIME.  Marland Kitchen VIRT-GARD 2.2-25-1 MG TABS TAKE 1 TABLET DAILY   No facility-administered encounter medications on file as of 01/23/2019.     Allergies  Allergen Reactions  . Aspirin     Upsets stomach  . Aspirin Nausea And Vomiting  . Calicum Glycerophosphate   . Niaspan [Niacin Er]   . Zocor [Simvastatin]     Review of Systems  Constitutional: Negative for activity change, appetite change, chills, fatigue, fever and unexpected weight change.  Respiratory: Positive for shortness of breath. Negative for cough, chest tightness and wheezing.   Cardiovascular: Negative for chest pain, palpitations and leg swelling.  Gastrointestinal: Negative for abdominal pain.  Genitourinary: Negative for decreased urine volume and difficulty urinating.  Musculoskeletal: Negative for arthralgias and myalgias.  Skin: Positive for color change and wound.  Neurological: Negative for dizziness, weakness, numbness and headaches.  Psychiatric/Behavioral: Negative for confusion.  All other systems reviewed and are negative.       Objective:  BP 138/84   Pulse (!) 107   Temp 97.8 F (36.6 C) (Oral)   Ht 5\' 8"  (1.727 m)   Wt 150 lb (68 kg)   BMI 22.81 kg/m    Wt Readings from Last 3 Encounters:  01/23/19 150 lb (68 kg)  01/17/19 153 lb (69.4 kg)  01/08/19 153 lb (69.4 kg)     Physical Exam Vitals signs and nursing note reviewed.  Constitutional:      General: He is not in acute distress.    Appearance: Normal appearance. He is well-developed and well-groomed. He is not ill-appearing or toxic-appearing.  HENT:     Head: Normocephalic.     Right Ear: Hearing, tympanic membrane, ear canal and external ear normal.     Left Ear: Hearing, tympanic membrane, ear canal and external  ear normal.     Mouth/Throat:     Mouth: Mucous membranes are moist.     Pharynx: Oropharynx is clear.  Eyes:     General: Lids are normal.     Extraocular Movements: Extraocular movements intact.     Conjunctiva/sclera: Conjunctivae normal.     Pupils: Pupils are equal, round, and reactive to light.  Neck:     Musculoskeletal: Full passive range of motion without pain and neck supple.  Cardiovascular:     Rate and Rhythm: Normal rate. Rhythm regularly irregular.     Heart sounds: No murmur. No friction rub. No gallop.   Pulmonary:     Effort: Pulmonary effort is normal. No respiratory distress.     Breath sounds: Normal breath sounds. No wheezing, rhonchi or rales.  Musculoskeletal:     Right lower leg: No edema.     Left lower leg: No edema.  Skin:    General: Skin is warm and dry.     Capillary Refill: Capillary refill takes less than 2 seconds.     Findings: Ecchymosis (forehead, bilateral eyes and cheeks) present.       Neurological:     General: No focal deficit present.     Mental Status: He is alert. Mental status is at baseline.  Psychiatric:        Mood and Affect: Mood normal.        Behavior: Behavior normal. Behavior is cooperative.        Thought Content: Thought content normal.        Judgment: Judgment normal.     Results for orders placed or performed in visit on 60/10/93  Basic metabolic panel  Result Value Ref Range   Glucose 114 (H) 65 - 99 mg/dL   BUN 20 8 - 27 mg/dL   Creatinine, Ser 1.34 (H) 0.76 - 1.27 mg/dL   GFR calc non Af Amer 48 (L) >59  mL/min/1.73   GFR calc Af Amer 55 (L) >59 mL/min/1.73   BUN/Creatinine Ratio 15 10 - 24   Sodium 142 134 - 144 mmol/L   Potassium 4.0 3.5 - 5.2 mmol/L   Chloride 103 96 - 106 mmol/L   CO2 25 20 - 29 mmol/L   Calcium 9.2 8.6 - 10.2 mg/dL       Pertinent labs & imaging results that were available during my care of the patient were reviewed by me and considered in my medical decision making.  Assessment & Plan:  Elek was seen today for suture / staple removal.  Diagnoses and all orders for this visit:  Encounter for removal of sutures 5 sutures removed from right forehead. Wound well approximated, no open areas, drainage, or bleeding. Wound care discussed.   Shortness of breath Small pleural effusions on xray. No shortness of breath noted during visit. No cough, sputum production, fever, chills, weakness, or confusion. No weight changes or palpitations. Pulse ox 98% on room air. Report any new or worsening symptoms. Follow up in 2 weeks.  -     DG Chest 2 View; Future     Continue all other maintenance medications.  Follow up plan: Return in about 2 weeks (around 02/06/2019), or if symptoms worsen or fail to improve, for [facility].  Educational handout given for wound care  The above assessment and management plan was discussed with the patient. The patient verbalized understanding of and has agreed to the management plan. Patient is aware to call the clinic if symptoms persist or worsen.  Patient is aware when to return to the clinic for a follow-up visit. Patient educated on when it is appropriate to go to the emergency department.   Monia Pouch, FNP-C [facility] [phone]

## 2019-01-31 ENCOUNTER — Ambulatory Visit (HOSPITAL_COMMUNITY)
Admission: RE | Admit: 2019-01-31 | Discharge: 2019-01-31 | Disposition: A | Discharge status: Home / Self Care | Payer: Medicare Other | Source: Ambulatory Visit | Attending: Cardiology | Admitting: Cardiology

## 2019-01-31 ENCOUNTER — Other Ambulatory Visit: Payer: Self-pay

## 2019-01-31 DIAGNOSIS — I482 Chronic atrial fibrillation, unspecified: Secondary | ICD-10-CM | POA: Insufficient documentation

## 2019-01-31 NOTE — Progress Notes (Signed)
*  PRELIMINARY RESULTS* Echocardiogram 2D Echocardiogram has been performed.  [name] 01/31/2019, 12:11 PM

## 2019-02-03 ENCOUNTER — Telehealth: Payer: Self-pay | Admitting: Neurology

## 2019-02-03 NOTE — Telephone Encounter (Signed)
Pt's wife states that the pt is "shaking like a leaf" and he is wanting to be seen sooner because "he is going out of his mind." Please advise.

## 2019-02-03 NOTE — Telephone Encounter (Signed)
I reached out to pt's wife (omie, ok per dpr) and scheduled f/u for 02/05/19 at 11 am. Pt passed covid19 screening question. Advised to check in at 10:30 am.

## 2019-02-04 ENCOUNTER — Other Ambulatory Visit: Payer: Self-pay | Admitting: Family Medicine

## 2019-02-05 ENCOUNTER — Encounter: Payer: Self-pay | Admitting: Neurology

## 2019-02-05 ENCOUNTER — Ambulatory Visit (INDEPENDENT_AMBULATORY_CARE_PROVIDER_SITE_OTHER): Payer: Medicare Other | Admitting: Neurology

## 2019-02-05 ENCOUNTER — Other Ambulatory Visit: Payer: Self-pay

## 2019-02-05 VITALS — BP 142/70 | HR 97 | Temp 98.0°F | Ht 68.0 in | Wt 155.3 lb

## 2019-02-05 DIAGNOSIS — R269 Unspecified abnormalities of gait and mobility: Secondary | ICD-10-CM

## 2019-02-05 DIAGNOSIS — R413 Other amnesia: Secondary | ICD-10-CM

## 2019-02-05 DIAGNOSIS — I25118 Atherosclerotic heart disease of native coronary artery with other forms of angina pectoris: Secondary | ICD-10-CM

## 2019-02-05 DIAGNOSIS — G20 Parkinson's disease: Secondary | ICD-10-CM | POA: Diagnosis not present

## 2019-02-05 DIAGNOSIS — G20A1 Parkinson's disease without dyskinesia, without mention of fluctuations: Secondary | ICD-10-CM

## 2019-02-05 DIAGNOSIS — E538 Deficiency of other specified B group vitamins: Secondary | ICD-10-CM | POA: Diagnosis not present

## 2019-02-05 HISTORY — DX: Unspecified abnormalities of gait and mobility: R26.9

## 2019-02-05 NOTE — Progress Notes (Signed)
Reason for visit: Parkinson's disease, gait disorder  Keith Hill is an 85 y.o. male  History of present illness:  Mr. Arreola is an 85 year old right-handed white male with a history of Parkinson's disease.  The patient has had 2 recent falls, he had to the emergency room on 17 Jan 2019 after he hit his head, he is on blood thinners.  He has had another fall 1 week later.  He was in physical therapy for his walking at the time of the falls.  He just completed physical therapy 1 week ago.  He is using a cane for ambulation, he denies any freezing episodes.  His family members indicate that he does have some problems with memory and confusion that was going on prior to the falls.  A CT scan of the brain was done in the emergency room was unremarkable.  He is in atrial fibrillation, he has some problems with shortness of breath with activity and he feels some fatigue in the legs at times when he walks.  The patient is on Sinemet taking 1.5 tablets 3 times daily, he also is on Azilect.  He has chronic problems with insomnia, on the nights that he does not sleep well he seems to be more confused the next day.  The patient returns to this office for an evaluation.     Past Medical History:  Diagnosis Date  . Allergic rhinitis, cause unspecified   . Cardiac dysrhythmia, unspecified   . Colon polyps   . Essential and other specified forms of tremor   . GERD (gastroesophageal reflux disease)   . Hemorrhoids, external   . Hiatal hernia   . Hyperlipidemia   . Hyperplasia of prostate   . Hypertension   . Lumbar disc disease   . Pancreatitis 1976  . Paralysis agitans ([place]) 11/07/2012  . Parkinson disease ([place])   . Peyronie's disease   . Prostate cancer [facility])    prostate    Past Surgical History:  Procedure Laterality Date  . CATARACT EXTRACTION    . EYE SURGERY    . HEMORRHOID SURGERY    . KNEE SURGERY Left   . PROSTATE BIOPSY  04/03/2013  . PROSTATE BIOPSY  11/01/2011  . SHOULDER SURGERY  Left     Family History  Problem Relation Age of Onset  . Heart disease Father   . Kidney failure Father   . Alcoholism Brother   . Heart attack Brother   . Hyperlipidemia Son   . Cancer Neg Hx     Social history:  reports that he has never smoked. He has never used smokeless tobacco. He reports that he does not drink alcohol or use drugs.    Allergies  Allergen Reactions  . Aspirin     Upsets stomach  . Aspirin Nausea And Vomiting  . Calicum Glycerophosphate   . Niaspan [Niacin Er]   . Zocor [Simvastatin]     Medications:  Prior to Admission medications   Medication Sig Start Date End Date Taking? Authorizing Provider  acetaminophen (TYLENOL) 500 MG tablet Take 500 mg by mouth every 6 (six) hours as needed.   Yes [provider]  atorvastatin (LIPITOR) 20 MG tablet TAKE (1) TABLET DAILY AS DIRECTED. 08/22/18  Yes Chipper Herb, MD  carbidopa-levodopa (SINEMET IR) 25-100 MG tablet TAKE 1&1/2 TABLETS IN THE MORNING AND 1 AT [facility] AND EVENING 03/21/18  Yes Kathrynn Ducking, MD  Cholecalciferol (VITAMIN D3) 5000 UNITS CAPS Take 1 capsule by  mouth 3 (three) times a week. Reported on 09/14/2015   Yes [provider]  docusate sodium (COLACE) 100 MG capsule Take 100 mg by mouth daily as needed. daily   Yes [provider]  doxylamine, Sleep, (UNISOM) 25 MG tablet Take 12.5 mg by mouth at bedtime as needed.   Yes [provider]  ELIQUIS 5 MG TABS tablet TAKE (1) TABLET TWICE A DAY. 02/05/19  Yes Chipper Herb, MD  guaiFENesin (MUCINEX) 600 MG 12 hr tablet Take 600 mg by mouth 2 (two) times daily as needed.   Yes [provider]  [name] Oil 300 MG CAPS Take 500 mg by mouth daily. Reported on 09/14/2015   Yes [provider]  Magnesium 250 MG TABS Take 2 tablets by mouth at bedtime as needed.    Yes [provider]  metoprolol tartrate (LOPRESSOR) 50 MG tablet Take 0.5 tablets (25 mg total) by mouth 2 (two) times daily. 01/08/19   Yes Minus Breeding, MD  Multiple Vitamins-Minerals (PRESERVISION AREDS 2 PO) Take 1 tablet by mouth daily.    Yes [provider]  rasagiline (AZILECT) 1 MG TABS tablet Take 1 tablet (1 mg total) by mouth daily. 09/26/18  Yes Kathrynn Ducking, MD  terazosin (HYTRIN) 2 MG capsule TAKE (1) CAPSULE DAILY AT BEDTIME. 02/05/19  Yes Chipper Herb, MD  VIRT-GARD 2.2-25-1 MG TABS TAKE 1 TABLET DAILY 11/11/18  Yes Chipper Herb, MD    ROS:  Out of a complete 14 system review of symptoms, the patient complains only of the following symptoms, and all other reviewed systems are negative.  Memory disturbance Walking problems Tremors Insomnia  Blood pressure (!) 142/70, pulse 97, temperature 98 F (36.7 C), temperature source Oral, height 5\' 8"  (1.727 m), weight 155 lb 5 oz (70.4 kg).  Physical Exam  General: The patient is alert and cooperative at the time of the examination.  Skin: No significant peripheral edema is noted.   Neurologic Exam  Mental status: The patient is alert and oriented x 3 at the time of the examination. The Mini-Mental status examination done today shows a total score 25/30.   Cranial nerves: Facial symmetry is present. Speech is normal, no aphasia or dysarthria is noted. Extraocular movements are full. Visual fields are full.  Bruising is seen on the face, a "goose egg" is on the right frontal area.  Motor: The patient has good strength in all 4 extremities.  Sensory examination: Soft touch sensation is symmetric on the face, arms, and legs.  Coordination: The patient has good finger-nose-finger and heel-to-shin bilaterally.  Gait and station: The patient has a stooped posture, he is able to walk fairly well with a cane, he seems to take good strides and good turns.  Tandem gait was not attempted.  Romberg is unsteady, he has a tendency to fall to the side or fall backwards.  Reflexes: Deep tendon reflexes are symmetric.   Assessment/Plan:  1.   Parkinson's disease  2.  Gait disturbance with recent falls  3.  Probable memory disturbance  4.  Chronic insomnia  The patient has recently completed physical therapy.  He has a cane and a walker at home to use.  If worsening confusion is noted, the family will contact me and we will repeat a CT scan of the brain to exclude a subdural hematoma.  The patient will follow-up for his next visit in August 2020, we may consider treatment for memory issues in the future.  He seems to function poorly on a cognitive basis the days after he has significant insomnia the night before.  Blood work will be done today.   Jill Alexanders MD 02/05/2019 11:20 AM  [facility] [address] [place],  [address]  Phone [phone] Fax [phone]

## 2019-02-06 ENCOUNTER — Encounter: Payer: Self-pay | Admitting: Physician Assistant

## 2019-02-06 ENCOUNTER — Ambulatory Visit (INDEPENDENT_AMBULATORY_CARE_PROVIDER_SITE_OTHER): Payer: Medicare Other | Admitting: Physician Assistant

## 2019-02-06 ENCOUNTER — Other Ambulatory Visit: Payer: Self-pay | Admitting: Family Medicine

## 2019-02-06 ENCOUNTER — Other Ambulatory Visit: Payer: Self-pay

## 2019-02-06 ENCOUNTER — Telehealth: Payer: Self-pay | Admitting: Cardiology

## 2019-02-06 DIAGNOSIS — R0609 Other forms of dyspnea: Secondary | ICD-10-CM | POA: Diagnosis not present

## 2019-02-06 DIAGNOSIS — I482 Chronic atrial fibrillation, unspecified: Secondary | ICD-10-CM

## 2019-02-06 LAB — RPR: RPR Ser Ql: NONREACTIVE

## 2019-02-06 LAB — SEDIMENTATION RATE: Sed Rate: 2 mm/hr (ref 0–30)

## 2019-02-06 LAB — VITAMIN B12: Vitamin B-12: >2000 pg/mL — ABNORMAL HIGH (ref 232–1245)

## 2019-02-06 NOTE — Telephone Encounter (Signed)
Called and spoke with patient about his SOB, At Fib and recent echo report. He reports he has had SOB off and on for awhile now.  He is aware he has At Fib but states his HR is never above 97 or 98 bpm.   In review of his medication his list reflexs Metoprolol Tartrate 50 mg 1/2 tablet twice a day.  Pt reports he has been taking 1 tablet once a day around 11 am or 12 N when he gets up.  Advised pt this is a twice a day medication and to achieve better coverage he should take it as directed (1/2 tablet BID).  He states understanding.  He also reports fatigue in the mornings that gets better during the day.  He denies any other s/s - no cough, no dizziness. He does not know what his HR and/or BP are now.  Offered to move pt's appointment up to [facility] with an APP however pt prefers to wait to see Dr Percival Spanish in [place].  Adivsed if s/s worsen or HR is staying above 100 bpm to c/b to let us know.  Reviewed echo results and decreased EF which could be adding to the increased SOB.  Advised patient to make sure he brings his medications with him to his appointment so that we can verify he is taking what the list in our computer says he is.  Pt states understanding and will c/b prior to his appt 7/1 if needed.  Will forward to Dr Percival Spanish for his knowledge.

## 2019-02-06 NOTE — Telephone Encounter (Signed)
Will forward to [place]

## 2019-02-06 NOTE — Telephone Encounter (Signed)
New Message   Patients wife is calling and she states that about every morning her spouse says "he needs to go to the doctor and he can't breath" She is not sure if its something that she can do at home. Per spouse "he apparently is experiencing afib in the morning". He does not check his HR regular. She says his BP is okay. He was scheduled for 7/15 to see Dr. Percival Spanish in [place] I moved them up to 7/1 at her request. Please call.

## 2019-02-06 NOTE — Progress Notes (Signed)
Telephone visit  Subjective: CC: Dyspnea on exertion PCP: Chipper Herb, MD HPI:Keith Hill is a 85 y.o. male calls for telephone consult today. Patient provides verbal consent for consult held via phone.  Patient is identified with 2 separate identifiers.  At this time the entire area is on COVID-19 social distancing and stay home orders are in place.  Patient is of higher risk and therefore we are performing this by a virtual method.  Location of patient: Home Location of provider: [facility] Others present for call: Wife  This patient is having a visit in regards to his chronic shortness of breath with exertion.  Around 2 years ago they found that he had atrial fibrillation.  And it has been a chronic condition.  He does also have known coronary artery disease, chronic kidney disease, hypertension, pancreatitis, prostate cancer history, Parkinson's.  Earlier in the day 1 of our nurses was trying to perform his annual wellness visit by telephone and he stated that he was just too tired to do it.  So therefore we have made a phone visit to see how he is doing.  He states that the most bothersome thing is after he walks for a little while he has to sit down and rest.  He denies having any shortness of breath whenever he is sitting still.  He denies any chest pain, headache, swelling in the ankles.  He states he may get a little bit bloated in his abdomen but not very much.  And he states his weight had been quite stable for a while.  I have discussed with him weighing every day and if there is a big jump in his weight to get in touch with cardiology or go to the emergency room.  An echocardiogram was performed recently under Dr. Rosezella [place] order, they are supposed to discuss at the next visit.  There shows some evidence of congestive heart failure with reduced ejection fraction.   ROS: Per HPI  Allergies  Allergen Reactions  . Aspirin     Upsets stomach  . Aspirin Nausea And Vomiting  .  Calicum Glycerophosphate   . Niaspan [Niacin Er]   . Zocor [Simvastatin]    Past Medical History:  Diagnosis Date  . Allergic rhinitis, cause unspecified   . Cardiac dysrhythmia, unspecified   . Colon polyps   . Essential and other specified forms of tremor   . Gait abnormality 02/05/2019  . GERD (gastroesophageal reflux disease)   . Hemorrhoids, external   . Hiatal hernia   . Hyperlipidemia   . Hyperplasia of prostate   . Hypertension   . Lumbar disc disease   . Pancreatitis 1976  . Paralysis agitans ([place]) 11/07/2012  . Parkinson disease ([place])   . Peyronie's disease   . Prostate cancer [facility])    prostate    Current Outpatient Medications:  .  acetaminophen (TYLENOL) 500 MG tablet, Take 500 mg by mouth every 6 (six) hours as needed., Disp: , Rfl:  .  atorvastatin (LIPITOR) 20 MG tablet, TAKE (1) TABLET DAILY AS DIRECTED., Disp: 90 tablet, Rfl: 1 .  carbidopa-levodopa (SINEMET IR) 25-100 MG tablet, TAKE 1&1/2 TABLETS IN THE MORNING AND 1 AT NOON AND EVENING, Disp: 315 tablet, Rfl: 3 .  Cholecalciferol (VITAMIN D3) 5000 UNITS CAPS, Take 1 capsule by mouth 3 (three) times a week. Reported on 09/14/2015, Disp: , Rfl:  .  docusate sodium (COLACE) 100 MG capsule, Take 100 mg by mouth daily as needed. daily,  Disp: , Rfl:  .  doxylamine, Sleep, (UNISOM) 25 MG tablet, Take 12.5 mg by mouth at bedtime as needed., Disp: , Rfl:  .  ELIQUIS 5 MG TABS tablet, TAKE (1) TABLET TWICE A DAY., Disp: 60 tablet, Rfl: 0 .  guaiFENesin (MUCINEX) 600 MG 12 hr tablet, Take 600 mg by mouth 2 (two) times daily as needed., Disp: , Rfl:  .  Krill Oil 300 MG CAPS, Take 500 mg by mouth daily. Reported on 09/14/2015, Disp: , Rfl:  .  Magnesium 250 MG TABS, Take 2 tablets by mouth at bedtime as needed. , Disp: , Rfl:  .  metoprolol tartrate (LOPRESSOR) 50 MG tablet, Take 0.5 tablets (25 mg total) by mouth 2 (two) times daily., Disp: 90 tablet, Rfl: 3 .  Multiple Vitamins-Minerals (PRESERVISION AREDS 2 PO), Take 1  tablet by mouth daily. , Disp: , Rfl:  .  rasagiline (AZILECT) 1 MG TABS tablet, Take 1 tablet (1 mg total) by mouth daily., Disp: 90 tablet, Rfl: 3 .  terazosin (HYTRIN) 2 MG capsule, TAKE (1) CAPSULE DAILY AT BEDTIME., Disp: 90 capsule, Rfl: 0 .  VIRT-GARD 2.2-25-1 MG TABS, TAKE 1 TABLET DAILY, Disp: 90 each, Rfl: 1  Assessment/ Plan: 85 y.o. male   1. Chronic atrial fibrillation Keep appointment with cardiology Weigh daily, if weight increases by 3 to 5 pounds in a day go to the emergency room If any worsening of dyspnea occurs go to the emergency room.  Meaning if shortness of breath occurs when laying down or sitting still.  2. DOE (dyspnea on exertion) See above   Continue all other maintenance medications as listed above.  Start time: 12:27 PM End time: 12:45 PM  No orders of the defined types were placed in this encounter.   Particia Nearing PA-C [place] [phone]

## 2019-02-10 ENCOUNTER — Telehealth: Payer: Self-pay | Admitting: Family Medicine

## 2019-02-10 ENCOUNTER — Telehealth: Payer: Self-pay | Admitting: Cardiology

## 2019-02-10 NOTE — Telephone Encounter (Signed)
Spoke with Janett Billow who stated Roselyn Reef was sitting right next to her in their office. Informed Janett Billow that appt time could be moved up to 6/29 at 11:00am. She states she will contact pt to inform. Called pt and he states that he was informed and told the person he spoke to that he would rather keep his 7/1 appt. 6/29 appt cancelled. Pt aware of 7/1 appt date, time (3:20), and location [facility]). Advised pt to wear a mask and that office has restrictions regarding those accompanying pts. Pt verbalized understanding          COVID-19 Pre-Screening Questions:  . In the past 7 to 10 days have you had a cough,  shortness of breath, headache, congestion, fever (100 or greater) body aches, chills, sore throat, or sudden loss of taste or sense of smell? NO . Have you been around anyone with known Covid 19? NO . Have you been around anyone who is awaiting Covid 19 test results in the past 7 to 10 days? NO . Have you been around anyone who has been exposed to Covid 19, or has mentioned symptoms of Covid 19 within the past 7 to 10 days? NO  If you have any concerns/questions about symptoms patients report during screening (either on the phone or at threshold). Contact the provider seeing the patient or DOD for further guidance.  If neither are available contact a member of the leadership team.

## 2019-02-10 NOTE — Telephone Encounter (Signed)
New message:   Roselyn Reef calling [facility] in [place] concering patient  having some AFIB and would like to get a sooner appt. Please call Roselyn Reef [phone]. Patient willing to come to [facility]. Roselyn Reef also has note. May also call the son.

## 2019-02-10 NOTE — Telephone Encounter (Signed)
Called son - wanted to discuss moving up Cardio appt: Pt is complaining everyday with a fib issues and feeling bad - he has also has 2 recent falls. Also complains of feeling bloated  Has appt with [facility] 7/1 they would like this sooner.  Cardio called / they will call Mitch  And / or myself back about appt

## 2019-02-11 NOTE — Telephone Encounter (Signed)
Patient has in office visit with Dr Percival Spanish on Tuesday 6/30, patient aware of change

## 2019-02-12 ENCOUNTER — Telehealth: Payer: Self-pay | Admitting: Neurology

## 2019-02-12 ENCOUNTER — Other Ambulatory Visit: Payer: Self-pay

## 2019-02-12 ENCOUNTER — Encounter (HOSPITAL_COMMUNITY): Payer: Self-pay | Admitting: Emergency Medicine

## 2019-02-12 ENCOUNTER — Emergency Department (HOSPITAL_COMMUNITY): Payer: Medicare Other

## 2019-02-12 ENCOUNTER — Emergency Department (HOSPITAL_COMMUNITY)
Admission: EM | Admit: 2019-02-12 | Discharge: 2019-02-13 | Disposition: A | Discharge status: Home / Self Care | Payer: Medicare Other | Attending: Emergency Medicine | Admitting: Emergency Medicine

## 2019-02-12 DIAGNOSIS — I110 Hypertensive heart disease with heart failure: Secondary | ICD-10-CM | POA: Diagnosis not present

## 2019-02-12 DIAGNOSIS — I5023 Acute on chronic systolic (congestive) heart failure: Secondary | ICD-10-CM | POA: Diagnosis not present

## 2019-02-12 DIAGNOSIS — R194 Change in bowel habit: Secondary | ICD-10-CM | POA: Diagnosis not present

## 2019-02-12 DIAGNOSIS — R1084 Generalized abdominal pain: Secondary | ICD-10-CM | POA: Diagnosis not present

## 2019-02-12 DIAGNOSIS — I509 Heart failure, unspecified: Secondary | ICD-10-CM | POA: Diagnosis not present

## 2019-02-12 DIAGNOSIS — G20 Parkinson's disease: Secondary | ICD-10-CM | POA: Insufficient documentation

## 2019-02-12 DIAGNOSIS — N183 Chronic kidney disease, stage 3 (moderate): Secondary | ICD-10-CM | POA: Diagnosis not present

## 2019-02-12 DIAGNOSIS — I130 Hypertensive heart and chronic kidney disease with heart failure and stage 1 through stage 4 chronic kidney disease, or unspecified chronic kidney disease: Secondary | ICD-10-CM | POA: Diagnosis not present

## 2019-02-12 DIAGNOSIS — R0609 Other forms of dyspnea: Secondary | ICD-10-CM | POA: Insufficient documentation

## 2019-02-12 DIAGNOSIS — R0602 Shortness of breath: Secondary | ICD-10-CM | POA: Diagnosis present

## 2019-02-12 DIAGNOSIS — Z79899 Other long term (current) drug therapy: Secondary | ICD-10-CM | POA: Insufficient documentation

## 2019-02-12 DIAGNOSIS — R109 Unspecified abdominal pain: Secondary | ICD-10-CM | POA: Insufficient documentation

## 2019-02-12 DIAGNOSIS — N179 Acute kidney failure, unspecified: Secondary | ICD-10-CM | POA: Diagnosis not present

## 2019-02-12 DIAGNOSIS — J90 Pleural effusion, not elsewhere classified: Secondary | ICD-10-CM | POA: Diagnosis not present

## 2019-02-12 LAB — CBC
HCT: 40.5 % (ref 39.0–52.0)
Hemoglobin: 13.1 g/dL (ref 13.0–17.0)
MCH: 33.9 pg (ref 26.0–34.0)
MCHC: 32.3 g/dL (ref 30.0–36.0)
MCV: 104.7 fL — ABNORMAL HIGH (ref 80.0–100.0)
Platelets: 152 10*3/uL (ref 150–400)
RBC: 3.87 MIL/uL — ABNORMAL LOW (ref 4.22–5.81)
RDW: 14.4 % (ref 11.5–15.5)
WBC: 8.3 10*3/uL (ref 4.0–10.5)
nRBC: 0 % (ref 0.0–0.2)

## 2019-02-12 LAB — COMPREHENSIVE METABOLIC PANEL
ALT: 21 U/L (ref 0–44)
AST: 30 U/L (ref 15–41)
Albumin: 3.9 g/dL (ref 3.5–5.0)
Alkaline Phosphatase: 96 U/L (ref 38–126)
Anion gap: 12 (ref 5–15)
BUN: 42 mg/dL — ABNORMAL HIGH (ref 8–23)
CO2: 24 mmol/L (ref 22–32)
Calcium: 8.9 mg/dL (ref 8.9–10.3)
Chloride: 100 mmol/L (ref 98–111)
Creatinine, Ser: 2.16 mg/dL — ABNORMAL HIGH (ref 0.61–1.24)
GFR calc Af Amer: 31 mL/min — ABNORMAL LOW (ref >60)
GFR calc non Af Amer: 27 mL/min — ABNORMAL LOW (ref >60)
Glucose, Bld: 120 mg/dL — ABNORMAL HIGH (ref 70–99)
Potassium: 4 mmol/L (ref 3.5–5.1)
Sodium: 136 mmol/L (ref 135–145)
Total Bilirubin: 1.3 mg/dL — ABNORMAL HIGH (ref 0.3–1.2)
Total Protein: 6.8 g/dL (ref 6.5–8.1)

## 2019-02-12 LAB — LIPASE, BLOOD: Lipase: 65 U/L — ABNORMAL HIGH (ref 11–51)

## 2019-02-12 LAB — URINALYSIS, ROUTINE W REFLEX MICROSCOPIC
Bilirubin Urine: NEGATIVE
Glucose, UA: NEGATIVE mg/dL
Hgb urine dipstick: NEGATIVE
Ketones, ur: NEGATIVE mg/dL
Leukocytes,Ua: NEGATIVE
Nitrite: NEGATIVE
Protein, ur: 100 mg/dL — AB
Specific Gravity, Urine: 1.028 (ref 1.005–1.030)
pH: 5 (ref 5.0–8.0)

## 2019-02-12 LAB — BRAIN NATRIURETIC PEPTIDE: B Natriuretic Peptide: 1518 pg/mL — ABNORMAL HIGH (ref 0.0–100.0)

## 2019-02-12 LAB — TROPONIN I (HIGH SENSITIVITY)
Troponin I (High Sensitivity): 20 ng/L — ABNORMAL HIGH (ref <18)
Troponin I (High Sensitivity): 19 ng/L — ABNORMAL HIGH (ref <18)

## 2019-02-12 LAB — CBG MONITORING, ED: Glucose-Capillary: 107 mg/dL — ABNORMAL HIGH (ref 70–99)

## 2019-02-12 MED ORDER — HYDROGEN PEROXIDE 3 % EX SOLN
CUTANEOUS | Status: AC
  Filled 2019-02-12: qty 473

## 2019-02-12 MED ORDER — SODIUM CHLORIDE 0.9% FLUSH: 3.0000 mL | Freq: Once | INTRAVENOUS | Status: DC

## 2019-02-12 NOTE — ED Notes (Signed)
Pt states his last bowel movement was yesterday after he took a laxative. Pt states the stool was soft but that he had not had a bowel movement for four days before that. Pt states he has had some abdominal discomfort and gas for a couple weeks.   Pt states he has had a hard time walking and breathing for the past couple weeks. Pt states he came in today because he felt lightheaded and weak when walking to the bathroom today, more than usual.

## 2019-02-12 NOTE — Telephone Encounter (Signed)
I reached out to the pt's son Karn Pickler ok per dpr) and advised per Dr. Jannifer Franklin labs from 02/05/19 were unremarkable. Son verbalized understanding and had no questions at this time.

## 2019-02-12 NOTE — Telephone Encounter (Signed)
Error

## 2019-02-12 NOTE — ED Provider Notes (Addendum)
[facility] EMERGENCY DEPARTMENT Provider Note   CSN: [id number] Arrival date & time: 02/12/19  1834    History   Chief Complaint Chief Complaint  Patient presents with  . Shortness of Breath  . Abdominal Pain    HPI Keith Hill is a 85 y.o. male.     Shortness of breath after walking a short distance for the past few weeks.  He normally complains of this, but appears worse than usual.  Feels lightheaded and weak.  He is also concerned about a mole generalized abdominal discomfort.  He took a laxative yesterday and had a bowel movement.  No substernal chest pain, cough, fever, sweats, chills, dysuria.  Severity is minimal.     Past Medical History:  Diagnosis Date  . Allergic rhinitis, cause unspecified   . Cardiac dysrhythmia, unspecified   . Colon polyps   . Essential and other specified forms of tremor   . Gait abnormality 02/05/2019  . GERD (gastroesophageal reflux disease)   . Hemorrhoids, external   . Hiatal hernia   . Hyperlipidemia   . Hyperplasia of prostate   . Hypertension   . Lumbar disc disease   . Pancreatitis 1976  . Paralysis agitans ([place]) 11/07/2012  . Parkinson disease ([place])   . Peyronie's disease   . Prostate cancer [facility])    prostate    Patient Active Problem List   Diagnosis Date Noted  . Gait abnormality 02/05/2019  . Chronic atrial fibrillation 08/20/2018  . Chronic anticoagulation 08/20/2018  . Preoperative cardiovascular examination 08/20/2018  . Benign hypertension with chronic kidney disease, stage III ([place]) 12/19/2017  . 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 Aortic atherosclerosis ([place]) 10/29/2017  . Spondylosis without myelopathy or radiculopathy, lumbar region 12/05/2016  . Back pain 11/18/2013  . Vitamin D deficiency 08/28/2013  . Hiatal hernia with gastroesophageal reflux 08/28/2013  . CAD (coronary artery disease) 04/08/2013  . Essential and other specified  forms of tremor 03/31/2013  . Parkinson disease ([place]) 11/07/2012  . Hemorrhoids 02/17/2011  . Hyperlipidemia with target LDL less than 70   . Prostate cancer ([place])   . Hypertension   . Allergic rhinitis   . Colon polyps, history of     Past Surgical History:  Procedure Laterality Date  . CATARACT EXTRACTION    . EYE SURGERY    . HEMORRHOID SURGERY    . KNEE SURGERY Left   . PROSTATE BIOPSY  04/03/2013  . PROSTATE BIOPSY  11/01/2011  . SHOULDER SURGERY Left         Home Medications    Prior to Admission medications   Medication Sig Start Date End Date Taking? Authorizing Provider  acetaminophen (TYLENOL) 500 MG tablet Take 500 mg by mouth every 6 (six) hours as needed for mild pain or moderate pain.    Yes [provider]  atorvastatin (LIPITOR) 20 MG tablet TAKE (1) TABLET DAILY AS DIRECTED. Patient taking differently: Take 20 mg by mouth daily.  02/06/19  Yes Chipper Herb, MD  carbidopa-levodopa (SINEMET IR) 25-100 MG tablet TAKE 1&1/2 TABLETS IN THE MORNING AND 1 AT [facility] AND EVENING 03/21/18  Yes Kathrynn Ducking, MD  Cholecalciferol (VITAMIN D3) 5000 UNITS CAPS Take 1 capsule by mouth 2 (two) times a week. tues and thurs   Yes [provider]  docusate sodium (COLACE) 100 MG capsule Take 100 mg by mouth daily as needed. daily   Yes [provider]  doxylamine, Sleep, (UNISOM) 25 MG tablet Take 12.5 mg by mouth at bedtime as needed.   Yes [provider]  ELIQUIS 5 MG TABS tablet TAKE (1) TABLET TWICE A DAY. 02/05/19  Yes Chipper Herb, MD  guaiFENesin (MUCINEX) 600 MG 12 hr tablet Take 600 mg by mouth 2 (two) times daily as needed.   Yes [provider]  [name] Oil 300 MG CAPS Take 500 mg by mouth daily. Reported on 09/14/2015   Yes [provider]  Magnesium 250 MG TABS Take 2 tablets by mouth daily as needed (for cramping).    Yes [provider]  metoprolol tartrate (LOPRESSOR) 50 MG tablet Take 0.5 tablets (25  mg total) by mouth 2 (two) times daily. 01/08/19  Yes Minus Breeding, MD  rasagiline (AZILECT) 1 MG TABS tablet Take 1 tablet (1 mg total) by mouth daily. 09/26/18  Yes Kathrynn Ducking, MD  terazosin (HYTRIN) 2 MG capsule TAKE (1) CAPSULE DAILY AT BEDTIME. Patient taking differently: Take 2 mg by mouth at bedtime.  02/05/19  Yes Chipper Herb, MD  VIRT-GARD 2.2-25-1 MG TABS TAKE 1 TABLET DAILY Patient taking differently: Take 1 tablet by mouth daily.  11/11/18  Yes Chipper Herb, MD    Family History Family History  Problem Relation Age of Onset  . Heart disease Father   . Kidney failure Father   . Alcoholism Brother   . Heart attack Brother   . Hyperlipidemia Son   . Cancer Neg Hx     Social History Social History   Tobacco Use  . Smoking status: Never Smoker  . Smokeless tobacco: Never Used  Substance Use Topics  . Alcohol use: Never    Frequency: Never  . Drug use: Never     Allergies   Aspirin, Aspirin, Calicum glycerophosphate, Niaspan [niacin er], and Zocor [simvastatin]   Review of Systems Review of Systems  All other systems reviewed and are negative.    Physical Exam Updated Vital Signs BP (!) 153/92   Pulse 68   Temp 99.1 F (37.3 C) (Rectal)   Resp (!) 21   Ht 5\' 8"  (1.727 m)   Wt 68 kg   SpO2 98%   BMI 22.81 kg/m   Physical Exam Vitals signs and nursing note reviewed.  Constitutional:      Appearance: He is well-developed.  HENT:     Head: Normocephalic and atraumatic.  Eyes:     Conjunctiva/sclera: Conjunctivae normal.  Neck:     Musculoskeletal: Neck supple.  Cardiovascular:     Rate and Rhythm: Normal rate and regular rhythm.  Pulmonary:     Effort: Pulmonary effort is normal.     Breath sounds: Normal breath sounds.  Abdominal:     General: Bowel sounds are normal.     Palpations: Abdomen is soft.  Musculoskeletal: Normal range of motion.  Skin:    General: Skin is warm and dry.  Neurological:     Mental Status: He is  alert and oriented to person, place, and time.  Psychiatric:        Behavior: Behavior normal.      ED Treatments / Results  Labs (all labs ordered are listed, but only abnormal results are displayed) Labs Reviewed  CBC - Abnormal; Notable for the following components:      Result Value   RBC 3.87 (*)    MCV 104.7 (*)    All other components within normal limits  COMPREHENSIVE METABOLIC PANEL - Abnormal;  Notable for the following components:   Glucose, Bld 120 (*)    BUN 42 (*)    Creatinine, Ser 2.16 (*)    Total Bilirubin 1.3 (*)    GFR calc non Af Amer 27 (*)    GFR calc Af Amer 31 (*)    All other components within normal limits  LIPASE, BLOOD - Abnormal; Notable for the following components:   Lipase 65 (*)    All other components within normal limits  TROPONIN I (HIGH SENSITIVITY) - Abnormal; Notable for the following components:   Troponin I (High Sensitivity) 20.00 (*)    All other components within normal limits  CBG MONITORING, ED - Abnormal; Notable for the following components:   Glucose-Capillary 107 (*)    All other components within normal limits  URINALYSIS, ROUTINE W REFLEX MICROSCOPIC  TROPONIN I (HIGH SENSITIVITY)    EKG EKG Interpretation  Date/Time:  Wednesday February 12 2019 18:42:45 EDT Ventricular Rate:  88 PR Interval:    QRS Duration: 80 QT Interval:  384 QTC Calculation: 464 R Axis:   84 Text Interpretation:  Atrial fibrillation with premature ventricular or aberrantly conducted complexes Cannot rule out Anterior infarct , age undetermined Abnormal ECG Confirmed by Nat Christen [phone]) on 02/12/2019 7:14:20 PM   Radiology Dg Chest 2 View  Result Date: 02/12/2019 CLINICAL DATA:  Weakness EXAM: CHEST - 2 VIEW COMPARISON:  01/23/2019, 10/01/2018 FINDINGS: Small bilateral pleural effusions. Cardiomegaly with vascular congestion. Hazy and interstitial opacity greatest at the bases, possible edema. Streaky atelectasis at the left base. Aortic  atherosclerosis. No pneumothorax. Focal opacity overlying the right seventh rib, may reflect healing rib fracture or potentially vascular and bony summation artifact. IMPRESSION: Cardiomegaly with vascular congestion, small pleural effusions and mild interstitial pulmonary edema. Electronically Signed   By: Donavan Foil M.D.   On: 02/12/2019 21:48    Procedures Procedures (including critical care time)  Medications Ordered in ED Medications  sodium chloride flush (NS) 0.9 % injection 3 mL (3 mLs Intravenous Not Given 02/12/19 1904)  hydrogen peroxide 3 % external solution (  Not Given 02/12/19 2121)     Initial Impression / Assessment and Plan / ED Course  I have reviewed the triage vital signs and the nursing notes.  Pertinent labs & imaging results that were available during my care of the patient were reviewed by me and considered in my medical decision making (see chart for details).        Patient presents with dyspnea on exertion.  Abdominal pain appears to be a secondary concern of low acuity.  He is in no acute distress.  Obtain basic screening tests.  2245: Tests reviewed.  Creatinine 2.16.  Lipase 65.  Total bili 1.3.  CT abdomen pelvis pending.  Discussed with Dr. Dayna Barker  Final Clinical Impressions(s) / ED Diagnoses   Final diagnoses:  Dyspnea on exertion  Abdominal pain, unspecified abdominal location  Elevated lipase  AKI (acute kidney injury) [facility])    ED Discharge Orders    None       Nat Christen, MD 02/12/19 2040    Nat Christen, MD 02/12/19 2253

## 2019-02-12 NOTE — ED Provider Notes (Signed)
11:04 PM Assumed care from Dr. Lacinda Axon, please see their note for full history, physical and decision making until this point. In brief this is a 85 y.o. year old male who presented to the ED tonight with Shortness of Breath and Abdominal Pain     Patient with dyspnea on exertion but found to have some abnormal labs so pending CT scan.  My evaluation patient is now describing dyspnea at rest.  He has some mild venous distention above the shoulders but no JVD that I can see.  He has crackles bilateral lower lobes are also diminished.  No lower extremity swelling.  Abdomen is benign.  We will add on a BNP.  BMP and results are consistent with heart failure.  Patient has no history of the same but does have a history of atrial fibrillation.  We will give a dose of Lasix and see how he does with that.  If does well then may be able to discharge but I  suspect more likely he will need to be admitted.  Patient with significant output with the Lasix.  Patient states his breathing is much better.  Nurse states that he ambulated around without any hypoxia or tachypnea.  The patient prefers to go home.  I discussed with his son over the phone and he has an appointment 5 days with cardiology.  He says his main issue was that he was short of breath when he ambulated.  He stated that his abdominal discomfort was more of a bloating feeling after eating which seems to have improved with Pepcid.  This is likely related to indigestion of some sort.  And his elevated lipase and bilirubin are likely from general intravascular depletion rather than primary pancreatic or biliary pathology.  After discussion with the son he felt safe taking him home and can bring him back immediately if anything changes.  A message will be sent to Dr. Percival Spanish in the portal as that to his follow-up with a 5 days.  Rx for Lasix given.  Discharge instructions, including strict return precautions for new or worsening symptoms, given. Patient and/or  family verbalized understanding and agreement with the plan as described.   Labs, studies and imaging reviewed by myself and considered in medical decision making if ordered. Imaging interpreted by radiology.  Labs Reviewed  CBC - Abnormal; Notable for the following components:      Result Value   RBC 3.87 (*)    MCV 104.7 (*)    All other components within normal limits  URINALYSIS, ROUTINE W REFLEX MICROSCOPIC - Abnormal; Notable for the following components:   Color, Urine AMBER (*)    APPearance TURBID (*)    Protein, ur 100 (*)    Bacteria, UA RARE (*)    All other components within normal limits  COMPREHENSIVE METABOLIC PANEL - Abnormal; Notable for the following components:   Glucose, Bld 120 (*)    BUN 42 (*)    Creatinine, Ser 2.16 (*)    Total Bilirubin 1.3 (*)    GFR calc non Af Amer 27 (*)    GFR calc Af Amer 31 (*)    All other components within normal limits  LIPASE, BLOOD - Abnormal; Notable for the following components:   Lipase 65 (*)    All other components within normal limits  TROPONIN I (HIGH SENSITIVITY) - Abnormal; Notable for the following components:   Troponin I (High Sensitivity) 20.00 (*)    All other components within normal limits  CBG MONITORING, ED - Abnormal; Notable for the following components:   Glucose-Capillary 107 (*)    All other components within normal limits  TROPONIN I (HIGH SENSITIVITY)  BRAIN NATRIURETIC PEPTIDE    DG Chest 2 View  Final Result    CT ABDOMEN PELVIS WO CONTRAST    (Results Pending)    No follow-ups on file.    Cay Kath, Corene Cornea, MD 02/13/19 [phone]

## 2019-02-12 NOTE — ED Notes (Signed)
Pt states he doesn't need to urinate at this time, aware of DO, urinal at bedside

## 2019-02-12 NOTE — ED Triage Notes (Signed)
Pt from home.  C/o of abdominal pain and swelling with increased weakness. Unable to ambulate well

## 2019-02-12 NOTE — Telephone Encounter (Signed)
Pt son(on DPR) is asking for a call with the results for the lab work from last week

## 2019-02-13 DIAGNOSIS — R0609 Other forms of dyspnea: Secondary | ICD-10-CM | POA: Diagnosis not present

## 2019-02-13 MED ORDER — FUROSEMIDE 40 MG PO TABS: 40.0000 mg | ORAL_TABLET | Freq: Every day | ORAL | 0 refills | Status: DC

## 2019-02-13 MED ORDER — FUROSEMIDE 10 MG/ML IJ SOLN
40.0000 mg | Freq: Once | INTRAMUSCULAR | Status: AC
  Administered 2019-02-13: 40 mg via INTRAVENOUS
  Filled 2019-02-13: qty 4

## 2019-02-13 NOTE — ED Notes (Signed)
PTS O2 DID NOT FALL BELOW 98% WHILE AMBULATING, STEADY GAIT, ONE STAFF ASSIST, PT STATES HE USUALLY WALKS AT HOME WITH A CANE

## 2019-02-17 ENCOUNTER — Other Ambulatory Visit: Payer: Medicare Other

## 2019-02-17 ENCOUNTER — Other Ambulatory Visit: Payer: Self-pay

## 2019-02-17 ENCOUNTER — Ambulatory Visit: Payer: Medicare Other | Admitting: Cardiology

## 2019-02-17 ENCOUNTER — Encounter: Payer: Self-pay | Admitting: Cardiology

## 2019-02-17 DIAGNOSIS — C61 Malignant neoplasm of prostate: Secondary | ICD-10-CM | POA: Diagnosis not present

## 2019-02-17 NOTE — Telephone Encounter (Signed)
LVM, Reminding pt of his apt on 02-18-19 with Dr Percival Spanish.

## 2019-02-17 NOTE — Progress Notes (Signed)
Cardiology Office Note   Date:  02/18/2019   ID:  Keith Hill, DOB 27-Apr-1932, MRN [id number]  PCP:  Claretta Fraise, MD  Cardiologist:   No primary care provider on file.   Chief Complaint  Patient presents with   Shortness of Breath      History of Present Illness: Keith Hill is a 85 y.o. male who presents who was previously seen by Dr. Wynonia Lawman.  He has had a stress perfusion study in 2017 that was negative for ischemia.  He had an echocardiogram at that time.  There was a mention of mild to moderate AI and MR but a well-preserved ejection fraction with left atrial enlargement.  He does well but has been limited a little bit by Parkinson's.  He was in the ED on 6/24 with dyspnea.  I reviewed these records for this visit.  He had an echo which demonstrates an EF of 30 - 35%.  In the ED his creat was elevated as below.  He diuresed well in the ED and the patient preferred not to be admitted.     He has had a general failure to thrive probably over several weeks.  He has had increasing shortness of breath for about 2 to 3 weeks and abdominal distention.  He had some weight gain.  He went to the emergency room where he was given a diuretic and has had significant improvement since then.  However, his creatinine was elevated as below.  He has been frail but he is having increasing weakness and decreased gait.  He has had decreased appetite.  He is not describing PND or orthopnea.  He is not describing palpitations, presyncope or syncope.  He does not really feel his atrial fibrillation.  He said no chest pressure, neck or arm discomfort.   Past Medical History:  Diagnosis Date   Cardiac dysrhythmia, unspecified    Colon polyps    Essential and other specified forms of tremor    Gait abnormality 02/05/2019   GERD (gastroesophageal reflux disease)    Hemorrhoids, external    Hiatal hernia    Hyperlipidemia    Hyperplasia of prostate    Hypertension    Lumbar disc disease     Pancreatitis 1976   Paralysis agitans ([place]) 11/07/2012   Parkinson disease ([place])    Peyronie's disease    Prostate cancer ([place])    prostate    Past Surgical History:  Procedure Laterality Date   CATARACT EXTRACTION     EYE SURGERY     HEMORRHOID SURGERY     KNEE SURGERY Left    PROSTATE BIOPSY  04/03/2013   PROSTATE BIOPSY  11/01/2011   SHOULDER SURGERY Left      Current Outpatient Medications  Medication Sig Dispense Refill   acetaminophen (TYLENOL) 500 MG tablet Take 500 mg by mouth every 6 (six) hours as needed for mild pain or moderate pain.      apixaban (ELIQUIS) 2.5 MG TABS tablet Take 1 tablet (2.5 mg total) by mouth 2 (two) times daily. 60 tablet 5   atorvastatin (LIPITOR) 20 MG tablet TAKE (1) TABLET DAILY AS DIRECTED. (Patient taking differently: Take 20 mg by mouth daily. ) 90 tablet 0   carbidopa-levodopa (SINEMET IR) 25-100 MG tablet TAKE 1&1/2 TABLETS IN THE MORNING AND 1 AT NOON AND EVENING 315 tablet 3   Cholecalciferol (VITAMIN D3) 5000 UNITS CAPS Take 1 capsule by mouth 2 (two) times a week. tues and thurs  docusate sodium (COLACE) 100 MG capsule Take 100 mg by mouth daily as needed. daily     doxylamine, Sleep, (UNISOM) 25 MG tablet Take 12.5 mg by mouth at bedtime as needed.     furosemide (LASIX) 20 MG tablet Take 1 tablet (20 mg total) by mouth daily. 30 tablet 5   guaiFENesin (MUCINEX) 600 MG 12 hr tablet Take 600 mg by mouth 2 (two) times daily as needed.     Krill Oil 300 MG CAPS Take 500 mg by mouth daily. Reported on 09/14/2015     Magnesium 250 MG TABS Take 2 tablets by mouth daily as needed (for cramping).      metoprolol tartrate (LOPRESSOR) 50 MG tablet Take 0.5 tablets (25 mg total) by mouth 2 (two) times daily. 90 tablet 3   rasagiline (AZILECT) 1 MG TABS tablet Take 1 tablet (1 mg total) by mouth daily. 90 tablet 3   terazosin (HYTRIN) 2 MG capsule TAKE (1) CAPSULE DAILY AT BEDTIME. (Patient taking differently: Take 2  mg by mouth at bedtime. ) 90 capsule 0   VIRT-GARD 2.2-25-1 MG TABS TAKE 1 TABLET DAILY (Patient taking differently: Take 1 tablet by mouth daily. ) 90 each 1   No current facility-administered medications for this visit.     Allergies:   Aspirin, Aspirin, Calicum glycerophosphate, Niaspan [niacin er], and Zocor [simvastatin]    ROS:  Please see the history of present illness.   Otherwise, review of systems are positive for none.   All other systems are reviewed and negative.    PHYSICAL EXAM: VS:  BP 109/63    Pulse 70    Temp 97.9 F (36.6 C)    Ht _0  (1.727 m)    Wt 147 lb 9.6 oz (67 kg)    BMI 22.44 kg/m  , BMI Body mass index is 22.44 kg/m. GENERAL:  Frail appearing NECK:  No jugular venous distention, waveform within normal limits, carotid upstroke brisk and symmetric, no bruits, no thyromegaly LYMPHATICS:  No cervical, inguinal adenopathy LUNGS:  Clear to auscultation bilaterally BACK:  No CVA tenderness CHEST:  Unremarkable HEART:  PMI not displaced or sustained,S1 and S2 within normal limits, no S3,  no clicks, no rubs, 2 of 6 apical systolic murmur, no diastolic murmurs, irregular ABD:  Flat, positive bowel sounds normal in frequency in pitch, no bruits, no rebound, no guarding, no midline pulsatile mass, no hepatomegaly, no splenomegaly EXT:  2 plus pulses throughout, mild ankle edema, no cyanosis no clubbing   EKG:  EKG is not ordered today. The ekg ordered 02/12/2019 atrial fibrillation, axis within normal limits, intervals within normal limits, nonspecific T wave flattening   Recent Labs: 09/30/2018: TSH 4.270 02/12/2019: ALT 21; B Natriuretic Peptide 1,518.0; BUN 42; Creatinine, Ser 2.16; Hemoglobin 13.1; Platelets 152; Potassium 4.0; Sodium 136    Lipid Panel    Component Value Date/Time   CHOL 114 07/24/2018 1552   CHOL 127 02/06/2013 0821   TRIG 54 07/24/2018 1552   TRIG 72 05/08/2017 0815   TRIG 59 02/06/2013 0821   HDL 56 07/24/2018 1552   HDL 52  05/08/2017 0815   HDL 48 02/06/2013 0821   CHOLHDL 2.0 07/24/2018 1552   LDLCALC 47 07/24/2018 1552   LDLCALC 76 03/19/2014 0816   LDLCALC 67 02/06/2013 0821      Wt Readings from Last 3 Encounters:  02/18/19 147 lb 9.6 oz (67 kg)  02/12/19 150 lb (68 kg)  02/05/19 155 lb 5 oz (70.4  kg)      Other studies Reviewed: Additional studies/ records that were reviewed today include: ED records. Review of the above records demonstrates:  Please see elsewhere in the note.     ASSESSMENT AND PLAN:  CHRONIC ATRIAL FIB:    He needs to have his Eliquis reduced to 2.5 mg twice daily given his renal function.  He has had reasonable rate control.  No change in therapy.  ACUTE SYSTOLIC HF: I am not clear the etiology.  However, he is really not a candidate for invasive evaluations.  He seems to be relatively better on the diuretic along the reduced dose to 20 mg daily because of his renal insufficiency.  I cannot titrate his meds as he has had hypotension probably related to his Parkinson's.  He will remain on the meds as listed for now.  HTN: Blood pressure is low at times.  No change in therapy.   CKD III:   Creat in the ED was 2.16 which was up from the baseline of  1.5.  As above I will reduce his Lasix and check a be met.given his treatment with diuretics.  Of note he does have follow-up with urologist tomorrow.  DYSLIPIDEMIA:    Per his primary physician.  MR/AI:   I will manage this conservatively.  Current medicines are reviewed at length with the patient today.  The patient has concerns regarding medicines.  The following changes have been made:  As above.   Labs/ tests ordered today include:   Orders Placed This Encounter  Procedures   TSH   Basic metabolic panel     Disposition:   FU with me in one month.     Signed, Minus Breeding, MD  02/18/2019 1:10 PM    [facility]

## 2019-02-18 ENCOUNTER — Encounter: Payer: Self-pay | Admitting: Cardiology

## 2019-02-18 ENCOUNTER — Ambulatory Visit (INDEPENDENT_AMBULATORY_CARE_PROVIDER_SITE_OTHER): Payer: Medicare Other | Admitting: Cardiology

## 2019-02-18 VITALS — BP 109/63 | HR 70 | Temp 97.9°F | Ht 68.0 in | Wt 147.6 lb

## 2019-02-18 DIAGNOSIS — I5021 Acute systolic (congestive) heart failure: Secondary | ICD-10-CM

## 2019-02-18 DIAGNOSIS — I129 Hypertensive chronic kidney disease with stage 1 through stage 4 chronic kidney disease, or unspecified chronic kidney disease: Secondary | ICD-10-CM | POA: Diagnosis not present

## 2019-02-18 DIAGNOSIS — N183 Chronic kidney disease, stage 3 unspecified: Secondary | ICD-10-CM

## 2019-02-18 DIAGNOSIS — I25118 Atherosclerotic heart disease of native coronary artery with other forms of angina pectoris: Secondary | ICD-10-CM

## 2019-02-18 DIAGNOSIS — I482 Chronic atrial fibrillation, unspecified: Secondary | ICD-10-CM | POA: Diagnosis not present

## 2019-02-18 DIAGNOSIS — Z5181 Encounter for therapeutic drug level monitoring: Secondary | ICD-10-CM | POA: Diagnosis not present

## 2019-02-18 MED ORDER — FUROSEMIDE 20 MG PO TABS: 20.0000 mg | ORAL_TABLET | Freq: Every day | ORAL | 5 refills | Status: DC

## 2019-02-18 MED ORDER — APIXABAN 2.5 MG PO TABS: 2.5000 mg | ORAL_TABLET | Freq: Two times a day (BID) | ORAL | 5 refills | Status: AC

## 2019-02-18 NOTE — Patient Instructions (Addendum)
Medication Instructions:  DECREASE YOUR FUROSEMIDE TO 20 MG DAILY (OK TO TAKE 1/2 TABLET DAILY UNTIL RUN OUT)  DECREASE YOUR ELIQUIS TO 2.5 MG TWICE A DAY   If you need a refill on your cardiac medications before your next appointment, please call your pharmacy.   Lab work: TSH/BMET TODAY   If you have labs (blood work) drawn today and your tests are completely normal, you will receive your results only by: Marland Kitchen MyChart Message (if you have MyChart) OR . A paper copy in the mail If you have any lab test that is abnormal or we need to change your treatment, we will call you to review the results.  Testing/Procedures: NONE  Follow-Up: At [facility], you and your health needs are our priority.  As part of our continuing mission to provide you with exceptional heart care, we have created designated Provider Care Teams.  These Care Teams include your primary Cardiologist (physician) and Advanced Practice Providers (APPs -  Physician Assistants and Nurse Practitioners) who all work together to provide you with the care you need, when you need it. You will need a follow up appointment in [place] may see HOCHREIN  or one of the following Advanced Practice Providers on your designated Care Team:   Rosaria Ferries, PA-C . Jory Sims, DNP, ANP  THE OFFICE WILL CALL WITH A DATE AND TIME FOR [place]

## 2019-02-19 ENCOUNTER — Ambulatory Visit: Payer: Medicare Other | Admitting: Cardiology

## 2019-02-19 DIAGNOSIS — R351 Nocturia: Secondary | ICD-10-CM | POA: Diagnosis not present

## 2019-02-19 DIAGNOSIS — R972 Elevated prostate specific antigen [PSA]: Secondary | ICD-10-CM | POA: Diagnosis not present

## 2019-02-19 DIAGNOSIS — C61 Malignant neoplasm of prostate: Secondary | ICD-10-CM | POA: Diagnosis not present

## 2019-02-19 LAB — BASIC METABOLIC PANEL
BUN/Creatinine Ratio: 15 (ref 10–24)
BUN: 27 mg/dL (ref 8–27)
CO2: 26 mmol/L (ref 20–29)
Calcium: 9 mg/dL (ref 8.6–10.2)
Chloride: 99 mmol/L (ref 96–106)
Creatinine, Ser: 1.78 mg/dL — ABNORMAL HIGH (ref 0.76–1.27)
GFR calc Af Amer: 39 mL/min/{1.73_m2} — ABNORMAL LOW (ref >59)
GFR calc non Af Amer: 34 mL/min/{1.73_m2} — ABNORMAL LOW (ref >59)
Glucose: 96 mg/dL (ref 65–99)
Potassium: 4.3 mmol/L (ref 3.5–5.2)
Sodium: 142 mmol/L (ref 134–144)

## 2019-02-19 LAB — TSH: TSH: 4.75 u[IU]/mL — ABNORMAL HIGH (ref 0.450–4.500)

## 2019-03-03 ENCOUNTER — Telehealth: Payer: Self-pay | Admitting: Family Medicine

## 2019-03-03 NOTE — Telephone Encounter (Signed)
Appt made to est care with Dr. Livia Snellen and have forms filled out for [facility] placement

## 2019-03-04 ENCOUNTER — Other Ambulatory Visit: Payer: Self-pay

## 2019-03-05 ENCOUNTER — Encounter: Payer: Self-pay | Admitting: Family Medicine

## 2019-03-05 ENCOUNTER — Telehealth: Payer: Self-pay | Admitting: Neurology

## 2019-03-05 ENCOUNTER — Ambulatory Visit: Payer: Medicare Other | Admitting: Cardiology

## 2019-03-05 ENCOUNTER — Ambulatory Visit (INDEPENDENT_AMBULATORY_CARE_PROVIDER_SITE_OTHER): Payer: Medicare Other | Admitting: Family Medicine

## 2019-03-05 VITALS — BP 166/66 | HR 54 | Temp 97.2°F | Ht 68.0 in | Wt 141.0 lb

## 2019-03-05 DIAGNOSIS — M47816 Spondylosis without myelopathy or radiculopathy, lumbar region: Secondary | ICD-10-CM | POA: Diagnosis not present

## 2019-03-05 DIAGNOSIS — R419 Unspecified symptoms and signs involving cognitive functions and awareness: Secondary | ICD-10-CM | POA: Diagnosis not present

## 2019-03-05 DIAGNOSIS — I25118 Atherosclerotic heart disease of native coronary artery with other forms of angina pectoris: Secondary | ICD-10-CM

## 2019-03-05 DIAGNOSIS — Z7901 Long term (current) use of anticoagulants: Secondary | ICD-10-CM | POA: Diagnosis not present

## 2019-03-05 DIAGNOSIS — I4891 Unspecified atrial fibrillation: Secondary | ICD-10-CM | POA: Diagnosis not present

## 2019-03-05 DIAGNOSIS — I1310 Hypertensive heart and chronic kidney disease without heart failure, with stage 1 through stage 4 chronic kidney disease, or unspecified chronic kidney disease: Secondary | ICD-10-CM | POA: Diagnosis not present

## 2019-03-05 DIAGNOSIS — K449 Diaphragmatic hernia without obstruction or gangrene: Secondary | ICD-10-CM

## 2019-03-05 DIAGNOSIS — I482 Chronic atrial fibrillation, unspecified: Secondary | ICD-10-CM

## 2019-03-05 DIAGNOSIS — K219 Gastro-esophageal reflux disease without esophagitis: Secondary | ICD-10-CM | POA: Diagnosis not present

## 2019-03-05 DIAGNOSIS — N183 Chronic kidney disease, stage 3 unspecified: Secondary | ICD-10-CM

## 2019-03-05 DIAGNOSIS — E785 Hyperlipidemia, unspecified: Secondary | ICD-10-CM | POA: Diagnosis not present

## 2019-03-05 DIAGNOSIS — I509 Heart failure, unspecified: Secondary | ICD-10-CM

## 2019-03-05 DIAGNOSIS — R410 Disorientation, unspecified: Secondary | ICD-10-CM | POA: Diagnosis not present

## 2019-03-05 DIAGNOSIS — G20 Parkinson's disease: Secondary | ICD-10-CM | POA: Diagnosis not present

## 2019-03-05 DIAGNOSIS — I700 Atherosclerosis of aorta: Secondary | ICD-10-CM | POA: Diagnosis not present

## 2019-03-05 DIAGNOSIS — R1084 Generalized abdominal pain: Secondary | ICD-10-CM

## 2019-03-05 DIAGNOSIS — I10 Essential (primary) hypertension: Secondary | ICD-10-CM

## 2019-03-05 LAB — URINALYSIS
Bilirubin, UA: NEGATIVE
Glucose, UA: NEGATIVE
Ketones, UA: NEGATIVE
Leukocytes,UA: NEGATIVE
Nitrite, UA: NEGATIVE
RBC, UA: NEGATIVE
Specific Gravity, UA: 1.025 (ref 1.005–1.030)
Urobilinogen, Ur: 2 mg/dL — ABNORMAL HIGH (ref 0.2–1.0)
pH, UA: 5.5 (ref 5.0–7.5)

## 2019-03-05 NOTE — Telephone Encounter (Signed)
pts son Karn Pickler is callingin wanting a call back to discuss pts parkinsons  CB# [phone]

## 2019-03-05 NOTE — Telephone Encounter (Signed)
I called the son.  Over the last several days, the patient is had some undulations in his cognitive capacity, he was the worst 2 days ago, very confused, he is better today.  He has had troubles being short of breath.  He is going to the doctor today, getting blood work done, urinalysis is important.  He has had a recent fall but he did not hit his head.  A medical evaluation initially is indicated.

## 2019-03-05 NOTE — Telephone Encounter (Signed)
I reached out to the pt's son Karn Pickler, ok per dpr).  Son reports this past Sat, Sunday and Monday the pt was not acting like him self. Pt was mumbling to him self, having visual Hallucinations and not remembering rooms within the house. Son reports Monday was the worse but on Tuesday he returned to his baseline.  Son reports the pt has fallen once since the last o/v but did not hit his head or sustain any injuries.  Pt is currently scheduled for a f/u on 04/02/19 and advised pt's son to keep as scheduled for right now.  Son is questioning if changes to his fathers medications should be made between now and his next appt?  Best call back # is [phone]

## 2019-03-05 NOTE — Progress Notes (Signed)
Subjective:  Patient ID: Keith Hill, male    DOB: Dec 26, 1931  Age: 85 y.o. MRN: [id number]  CC: No chief complaint on file.   HPI [name] presents for transfer of care from Dr. Laurance Flatten.  He has been seen regularly by Dr. Laurance Flatten for 40 years until Dr. Tawanna Sat retirement recently.  Currently he is here with his son Keith Hill.  Most of the history is given by Keith Hill.  [name] has had a moderate degree of confusion.  This took a sudden turn for the worse 2 days ago but only lasted for 1 day.  During that time the patient could not speak clearly and he could not even figure out how to hold a fork or feed himself.  His son was beside himself thinking he could not handle this if it was so bad and is now considering placing him in a nursing facility.  He wants to be sure that an FL 2 can be completed as needed.  However, since the patient's behavior normalized yesterday there is some reluctance to move forward.  Keith Hill talked to neurology service just prior to this visit and they felt it was not related to his Parkinson's.  However the patient does have quite a bit of weakness in the legs and instability of gait.  He has been seen recently for congestive heart failure.  He was started on Lasix in the emergency department 2 to 3 weeks ago.  Seen a week later by Dr. Percival Spanish who cut back on the Lasix but had him continue taking it.  Patient's BN P was approximately 1500.  Additionally the patient complains of chronic abdominal pain.  This limits his ability to eat and he is losing weight.  He gets bloated after he eats feels pain and is afraid to continue.  He had a CT scan of his abdomen a few weeks ago that was negative for any significant issue.  He is not having diarrhea.  No constipation.  Patient in for follow-up of atrial fibrillation. Patient denies any recent bouts of chest pain or palpitations. Additionally, patient is taking anticoagulants. Patient denies any recent excessive bleeding episodes  including epistaxis, bleeding from the gums, genitalia, rectal bleeding or hematuria. Additionally there has been no excessive bruising.  Follow-up of hypertension. Patient has no history of headache chest pain or shortness of breath or recent cough. Patient also denies symptoms of TIA such as numbness weakness lateralizing. Patient checks  blood pressure at home and has not had any elevated readings recently. Patient denies side effects from his medication. States taking it regularly.   Depression screen [facility] 2/9 03/05/2019 01/23/2019 11/02/2018  Decreased Interest 0 0 0  Down, Depressed, Hopeless 0 0 0  PHQ - 2 Score 0 0 0  Altered sleeping 0 0 -  Tired, decreased energy 0 0 -  Change in appetite 0 0 -  Feeling bad or failure about yourself  0 0 -  Trouble concentrating 0 0 -  Moving slowly or fidgety/restless 0 0 -  Suicidal thoughts 0 0 -  PHQ-9 Score 0 0 -  Some recent data might be hidden    History [name] has a past medical history of Cardiac dysrhythmia, unspecified, Colon polyps, Colon polyps, history of, Essential and other specified forms of tremor, Gait abnormality (02/05/2019), GERD (gastroesophageal reflux disease), Hemorrhoids, external, Hiatal hernia, Hyperlipidemia, Hyperplasia of prostate, Hypertension, Lumbar disc disease, Pancreatitis (1976), Paralysis agitans ([place]) (11/07/2012), Parkinson disease ([place]), Peyronie's disease, and Prostate  cancer ([place]).   He has a past surgical history that includes Knee surgery (Left); Shoulder surgery (Left); Hemorrhoid surgery; Prostate biopsy (04/03/2013); Prostate biopsy (11/01/2011); Eye surgery; and Cataract extraction.   His family history includes Alcoholism in his brother; Heart attack in his brother; Heart disease in his father; Hyperlipidemia in his son; Kidney failure in his father.He reports that he has never smoked. He has never used smokeless tobacco. He reports that he does not drink alcohol or use drugs.    ROS Review of Systems    Constitutional: Positive for appetite change, fatigue and unexpected weight change. Negative for activity change.  HENT: Negative for congestion, ear pain, hearing loss, postnasal drip and trouble swallowing.   Eyes: Negative for pain and visual disturbance.  Respiratory: Negative for cough, chest tightness and shortness of breath.   Cardiovascular: Negative for chest pain, palpitations and leg swelling.  Gastrointestinal: Positive for abdominal pain. Negative for abdominal distention, blood in stool, constipation, diarrhea, nausea and vomiting.  Endocrine: Negative for cold intolerance, heat intolerance and polydipsia.  Genitourinary: Negative for flank pain and urgency.  Musculoskeletal: Positive for arthralgias and gait problem. Negative for joint swelling.  Skin: Negative for color change, rash and wound.  Neurological: Positive for speech difficulty and light-headedness. Negative for dizziness, syncope, weakness, numbness and headaches.  Hematological: Does not bruise/bleed easily.  Psychiatric/Behavioral: Positive for agitation, confusion and decreased concentration. Negative for dysphoric mood and sleep disturbance. The patient is not nervous/anxious.     Objective:  BP (!) 166/66    Pulse (!) 54    Temp (!) 97.2 F (36.2 C) (Oral)    Ht 5' 8" (1.727 m)    Wt 141 lb (64 kg)    BMI 21.44 kg/m   BP Readings from Last 3 Encounters:  03/05/19 (!) 166/66  02/18/19 109/63  02/13/19 (!) 150/98    Wt Readings from Last 3 Encounters:  03/05/19 141 lb (64 kg)  02/18/19 147 lb 9.6 oz (67 kg)  02/12/19 150 lb (68 kg)     Physical Exam Constitutional:      Appearance: He is well-developed.  HENT:     Head: Normocephalic and atraumatic.  Eyes:     Pupils: Pupils are equal, round, and reactive to light.  Neck:     Musculoskeletal: Normal range of motion.     Thyroid: No thyromegaly.     Trachea: No tracheal deviation.  Cardiovascular:     Rate and Rhythm: Normal rate and  regular rhythm.     Heart sounds: Murmur present. No friction rub. No gallop.   Pulmonary:     Breath sounds: Normal breath sounds. No wheezing or rales.  Abdominal:     General: Bowel sounds are normal. There is no distension.     Palpations: Abdomen is soft. There is no mass.     Tenderness: There is no abdominal tenderness.     Hernia: There is no hernia in the left inguinal area.  Musculoskeletal: Normal range of motion.  Lymphadenopathy:     Cervical: No cervical adenopathy.  Skin:    General: Skin is warm and dry.  Neurological:     Mental Status: He is alert and oriented to person, place, and time.  Psychiatric:        Mood and Affect: Mood is anxious.        Speech: Speech is slurred and tangential.        Behavior: Behavior is cooperative.  Cognition and Memory: Cognition is impaired. Memory is impaired.       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, stage III (HCC)  Spondylosis without myelopathy or radiculopathy, lumbar region  Chronic atrial fibrillation  Chronic anticoagulation  Generalized abdominal pain -     CBC with Differential/Platelet -     CMP14+EGFR -     Pancreatic Elastase, Fecal  Atrial fibrillation, unspecified type (HCC) -     Brain natriuretic peptide -     D-dimer, quantitative (not at [facility])  Aortic atherosclerosis (HCC) -     Brain natriuretic peptide  Congestive heart failure, unspecified HF chronicity, unspecified heart failure type (HCC) -     Brain natriuretic peptide   Many possible causes for this severe episode of confusion have been  discussed with the patient and his son.  It is possible he threw a blood clot therefore d-dimer was ordered.  This is particularly possible with his history of a's atrial fibrillation.  However he is anticoagulated.  It is not clear how well regulated the patient's medications are although his son tries very hard to keep up with them for him.  The patient speech is slurred and he is somewhat confused so he is not reliable himself.  Additionally this could have been related to electrolytes since he has been started on Lasix recently.  Those will be checked as well.  He has not been excessively short of breath over the last several days so that exacerbation of heart failure seems a bit less likely at this point but BNP will be checked to ascertain.  The patient is a prostate cancer survivor and could possibly have brain mets.  However recent scans have been negative.  This could even be urosepsis picture.  Therefore urine is being checked today.  He has not had frequency or dysuria but again is not a reliable historian.  Patient also has a history of pancreatitis and with his abdominal pain after eating there may be mild absorption or enzymatic problem.  Therefore fecal pancreatic elastase will be checked.  Consider for enzyme replacement as needed.  Although neurology, Dr. Jannifer Franklin, states that he did not think this was related to the Parkinson's disease I think Shy-Drager syndrome and or Lewy body dementia should be considered as well.  I am having [name] maintain his Vitamin D3, docusate sodium, Krill Oil, doxylamine (Sleep), Magnesium, guaiFENesin, carbidopa-levodopa, rasagiline, acetaminophen, Virt-Gard, metoprolol tartrate, terazosin, atorvastatin, apixaban, and furosemide.  Allergies as of 03/05/2019      Reactions   Aspirin    Upsets stomach   Aspirin Nausea And Vomiting   Calicum Glycerophosphate    Niaspan [niacin Er]    Zocor [simvastatin]       Medication List       Accurate as of  March 05, 2019  6:00 PM. If you have any questions, ask your nurse or doctor.        acetaminophen 500 MG tablet Commonly known as: TYLENOL Take 500 mg by mouth  every 6 (six) hours as needed for mild pain or moderate pain.   apixaban 2.5 MG Tabs tablet Commonly known as: Eliquis Take 1 tablet (2.5 mg total) by mouth 2 (two) times daily.   atorvastatin 20 MG tablet Commonly known as: LIPITOR TAKE (1) TABLET DAILY AS DIRECTED. What changed: See the new instructions.   carbidopa-levodopa 25-100 MG tablet Commonly known as: SINEMET IR TAKE 1&1/2 TABLETS IN THE MORNING AND 1 AT NOON AND EVENING   docusate sodium 100 MG capsule Commonly known as: COLACE Take 100 mg by mouth daily as needed. daily   doxylamine (Sleep) 25 MG tablet Commonly known as: UNISOM Take 12.5 mg by mouth at bedtime as needed.   furosemide 20 MG tablet Commonly known as: LASIX Take 1 tablet (20 mg total) by mouth daily.   guaiFENesin 600 MG 12 hr tablet Commonly known as: MUCINEX Take 600 mg by mouth 2 (two) times daily as needed.   Krill Oil 300 MG Caps Take 500 mg by mouth daily. Reported on 09/14/2015   Magnesium 250 MG Tabs Take 2 tablets by mouth daily as needed (for cramping).   metoprolol tartrate 50 MG tablet Commonly known as: LOPRESSOR Take 0.5 tablets (25 mg total) by mouth 2 (two) times daily.   rasagiline 1 MG Tabs tablet Commonly known as: AZILECT Take 1 tablet (1 mg total) by mouth daily.   terazosin 2 MG capsule Commonly known as: HYTRIN TAKE (1) CAPSULE DAILY AT BEDTIME. What changed: See the new instructions.   Virt-Gard 2.2-25-1 MG Tabs Generic drug: Folic Acid-Vit M5-HQI O96 TAKE 1 TABLET DAILY   Vitamin D3 125 MCG (5000 UT) Caps Take 1 capsule by mouth 2 (two) times a week. tues and thurs        Follow-up: Return in about 6 weeks (around 04/16/2019).  Claretta Fraise, M.D.

## 2019-03-06 ENCOUNTER — Emergency Department (HOSPITAL_COMMUNITY): Payer: Medicare Other

## 2019-03-06 ENCOUNTER — Other Ambulatory Visit: Payer: Self-pay

## 2019-03-06 ENCOUNTER — Other Ambulatory Visit: Payer: Self-pay | Admitting: Family Medicine

## 2019-03-06 ENCOUNTER — Encounter (HOSPITAL_COMMUNITY): Payer: Self-pay | Admitting: Emergency Medicine

## 2019-03-06 ENCOUNTER — Ambulatory Visit (HOSPITAL_COMMUNITY)
Admission: RE | Admit: 2019-03-06 | Discharge: 2019-03-06 | Disposition: A | Discharge status: Home / Self Care | Payer: Medicare Other | Source: Ambulatory Visit | Attending: Family Medicine | Admitting: Family Medicine

## 2019-03-06 ENCOUNTER — Emergency Department (HOSPITAL_COMMUNITY)
Admission: EM | Admit: 2019-03-06 | Discharge: 2019-03-06 | Disposition: A | Discharge status: Home / Self Care | Payer: Medicare Other | Attending: Emergency Medicine | Admitting: Emergency Medicine

## 2019-03-06 DIAGNOSIS — R0602 Shortness of breath: Secondary | ICD-10-CM

## 2019-03-06 DIAGNOSIS — I509 Heart failure, unspecified: Secondary | ICD-10-CM | POA: Insufficient documentation

## 2019-03-06 DIAGNOSIS — Z7901 Long term (current) use of anticoagulants: Secondary | ICD-10-CM | POA: Insufficient documentation

## 2019-03-06 DIAGNOSIS — I2510 Atherosclerotic heart disease of native coronary artery without angina pectoris: Secondary | ICD-10-CM | POA: Diagnosis not present

## 2019-03-06 DIAGNOSIS — Y92231 Patient bathroom in hospital as the place of occurrence of the external cause: Secondary | ICD-10-CM | POA: Insufficient documentation

## 2019-03-06 DIAGNOSIS — R410 Disorientation, unspecified: Secondary | ICD-10-CM

## 2019-03-06 DIAGNOSIS — S0990XA Unspecified injury of head, initial encounter: Secondary | ICD-10-CM | POA: Diagnosis not present

## 2019-03-06 DIAGNOSIS — N183 Chronic kidney disease, stage 3 (moderate): Secondary | ICD-10-CM | POA: Diagnosis not present

## 2019-03-06 DIAGNOSIS — G20 Parkinson's disease: Secondary | ICD-10-CM | POA: Diagnosis not present

## 2019-03-06 DIAGNOSIS — I130 Hypertensive heart and chronic kidney disease with heart failure and stage 1 through stage 4 chronic kidney disease, or unspecified chronic kidney disease: Secondary | ICD-10-CM | POA: Insufficient documentation

## 2019-03-06 DIAGNOSIS — Y999 Unspecified external cause status: Secondary | ICD-10-CM | POA: Insufficient documentation

## 2019-03-06 DIAGNOSIS — W010XXA Fall on same level from slipping, tripping and stumbling without subsequent striking against object, initial encounter: Secondary | ICD-10-CM | POA: Insufficient documentation

## 2019-03-06 DIAGNOSIS — S0083XA Contusion of other part of head, initial encounter: Secondary | ICD-10-CM | POA: Insufficient documentation

## 2019-03-06 DIAGNOSIS — Z79899 Other long term (current) drug therapy: Secondary | ICD-10-CM | POA: Insufficient documentation

## 2019-03-06 DIAGNOSIS — W19XXXA Unspecified fall, initial encounter: Secondary | ICD-10-CM

## 2019-03-06 DIAGNOSIS — Y939 Activity, unspecified: Secondary | ICD-10-CM | POA: Insufficient documentation

## 2019-03-06 LAB — CMP14+EGFR
ALT: 12 IU/L (ref 0–44)
AST: 27 IU/L (ref 0–40)
Albumin/Globulin Ratio: 1.5 (ref 1.2–2.2)
Albumin: 4.3 g/dL (ref 3.6–4.6)
Alkaline Phosphatase: 105 IU/L (ref 39–117)
BUN/Creatinine Ratio: 16 (ref 10–24)
BUN: 24 mg/dL (ref 8–27)
Bilirubin Total: 1.4 mg/dL — ABNORMAL HIGH (ref 0.0–1.2)
CO2: 27 mmol/L (ref 20–29)
Calcium: 9.2 mg/dL (ref 8.6–10.2)
Chloride: 96 mmol/L (ref 96–106)
Creatinine, Ser: 1.51 mg/dL — ABNORMAL HIGH (ref 0.76–1.27)
GFR calc Af Amer: 48 mL/min/{1.73_m2} — ABNORMAL LOW (ref >59)
GFR calc non Af Amer: 42 mL/min/{1.73_m2} — ABNORMAL LOW (ref >59)
Globulin, Total: 2.9 g/dL (ref 1.5–4.5)
Glucose: 104 mg/dL — ABNORMAL HIGH (ref 65–99)
Potassium: 3.6 mmol/L (ref 3.5–5.2)
Sodium: 143 mmol/L (ref 134–144)
Total Protein: 7.2 g/dL (ref 6.0–8.5)

## 2019-03-06 LAB — CBC WITH DIFFERENTIAL/PLATELET
Basophils Absolute: 0 10*3/uL (ref 0.0–0.2)
Basos: 1 %
EOS (ABSOLUTE): 0.2 10*3/uL (ref 0.0–0.4)
Eos: 2 %
Hematocrit: 40.3 % (ref 37.5–51.0)
Hemoglobin: 13.5 g/dL (ref 13.0–17.7)
Immature Grans (Abs): 0 10*3/uL (ref 0.0–0.1)
Immature Granulocytes: 0 %
Lymphocytes Absolute: 0.9 10*3/uL (ref 0.7–3.1)
Lymphs: 13 %
MCH: 34.2 pg — ABNORMAL HIGH (ref 26.6–33.0)
MCHC: 33.5 g/dL (ref 31.5–35.7)
MCV: 102 fL — ABNORMAL HIGH (ref 79–97)
Monocytes Absolute: 0.7 10*3/uL (ref 0.1–0.9)
Monocytes: 10 %
Neutrophils Absolute: 5.2 10*3/uL (ref 1.4–7.0)
Neutrophils: 74 %
Platelets: 157 10*3/uL (ref 150–450)
RBC: 3.95 x10E6/uL — ABNORMAL LOW (ref 4.14–5.80)
RDW: 12.7 % (ref 11.6–15.4)
WBC: 7 10*3/uL (ref 3.4–10.8)

## 2019-03-06 LAB — D-DIMER, QUANTITATIVE: D-DIMER: 0.81 mg/L FEU — ABNORMAL HIGH (ref 0.00–0.49)

## 2019-03-06 LAB — TSH: TSH: 8.37 u[IU]/mL — ABNORMAL HIGH (ref 0.450–4.500)

## 2019-03-06 LAB — BRAIN NATRIURETIC PEPTIDE: BNP: 2675.2 pg/mL — ABNORMAL HIGH (ref 0.0–100.0)

## 2019-03-06 LAB — T4, FREE: Free T4: 1.14 ng/dL (ref 0.82–1.77)

## 2019-03-06 LAB — POCT I-STAT CREATININE: Creatinine, Ser: 1.3 mg/dL — ABNORMAL HIGH (ref 0.61–1.24)

## 2019-03-06 MED ORDER — IOHEXOL 350 MG/ML SOLN
100.0000 mL | Freq: Once | INTRAVENOUS | Status: AC | PRN
  Administered 2019-03-06: 75 mL via INTRAVENOUS

## 2019-03-06 MED ORDER — LEVOTHYROXINE SODIUM 25 MCG PO TABS: 25.0000 ug | ORAL_TABLET | Freq: Every day | ORAL | 1 refills | Status: DC

## 2019-03-06 NOTE — ED Notes (Signed)
PA at bedside.

## 2019-03-06 NOTE — ED Notes (Signed)
See triage notes

## 2019-03-06 NOTE — Discharge Instructions (Addendum)
You may apply ice packs on and off to help control swelling.  Tylenol if needed for pain.  Follow-up with your primary doctor for recheck.  Return to the ER for any worsening symptoms such as sudden onset of severe headache, vomiting, visual changes, or sudden changes to your sense of balance.

## 2019-03-06 NOTE — ED Triage Notes (Signed)
Pt fell in the bathroom hitting the back of his head. He has a knot on the back of his head.

## 2019-03-06 NOTE — ED Provider Notes (Signed)
[facility] EMERGENCY DEPARTMENT Provider Note   CSN: [id number] Arrival date & time: 03/06/19  1539     History   Chief Complaint Chief Complaint  Patient presents with   Fall    HPI Keith Hill is a 85 y.o. male.     HPI   Keith Hill is a 85 y.o. male with past medical history of hypertension, Parkinson disease, coronary artery disease, and atrial fibrillation, who presents to the Emergency Department complaining of a mechanical fall earlier today that occurred here on the hospital grounds.  He states that he was here as an outpatient for a CT scan of his chest ordered by his PCP.  He states that he went to the bathroom in the radiology dept and slipped as he attempted to sit down, falling and hitting the back of his head.  Patient is anticoagulated on Eliquis for atrial fibrillation.  He complains of localized swelling to the right scalp, but denies headache, LOC, neck pain, visual changes, nausea or vomiting.  He is accompanied by his son who states that his father has Parkinson's and falls rather frequently.  He has canes and walkers at home but son states he rarely uses them.   Past Medical History:  Diagnosis Date   Cardiac dysrhythmia, unspecified    Colon polyps    Colon polyps, history of    Essential and other specified forms of tremor    Gait abnormality 02/05/2019   GERD (gastroesophageal reflux disease)    Hemorrhoids, external    Hiatal hernia    Hyperlipidemia    Hyperplasia of prostate    Hypertension    Lumbar disc disease    Pancreatitis 1976   Paralysis agitans ([place]) 11/07/2012   Parkinson disease ([place])    Peyronie's disease    Prostate cancer [facility])    prostate    Patient Active Problem List   Diagnosis Date Noted   Gait abnormality 02/05/2019   Chronic atrial fibrillation 08/20/2018   Chronic anticoagulation 08/20/2018   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, stage III ([place]) 10/29/2017     Aortic atherosclerosis ([place]) 10/29/2017   Spondylosis without myelopathy or radiculopathy, lumbar region 12/05/2016   Back pain 11/18/2013   Vitamin D deficiency 08/28/2013   Hiatal hernia with gastroesophageal reflux 08/28/2013   CAD (coronary artery disease) 04/08/2013   Essential and other specified forms of tremor 03/31/2013   Parkinson disease ([place]) 11/07/2012   Hemorrhoids 02/17/2011   Hyperlipidemia with target LDL less than 70    Prostate cancer ([place])    Hypertension    Allergic rhinitis     Past Surgical History:  Procedure Laterality Date   CATARACT EXTRACTION     EYE SURGERY     HEMORRHOID SURGERY     KNEE SURGERY Left    PROSTATE BIOPSY  04/03/2013   PROSTATE BIOPSY  11/01/2011   SHOULDER SURGERY Left         Home Medications    Prior to Admission medications   Medication Sig Start Date End Date Taking? Authorizing Provider  acetaminophen (TYLENOL) 500 MG tablet Take 500 mg by mouth every 6 (six) hours as needed for mild pain or moderate pain.     [provider]  apixaban (ELIQUIS) 2.5 MG TABS tablet Take 1 tablet (2.5 mg total) by mouth 2 (two) times daily. 02/18/19   Minus Breeding, MD  atorvastatin (LIPITOR) 20 MG tablet TAKE (1) TABLET DAILY AS DIRECTED. Patient taking differently: Take  20 mg by mouth daily.  02/06/19   Chipper Herb, MD  carbidopa-levodopa (SINEMET IR) 25-100 MG tablet TAKE 1&1/2 TABLETS IN THE MORNING AND 1 AT [facility] AND EVENING 03/21/18   Kathrynn Ducking, MD  Cholecalciferol (VITAMIN D3) 5000 UNITS CAPS Take 1 capsule by mouth 2 (two) times a week. tues and thurs    [provider]  docusate sodium (COLACE) 100 MG capsule Take 100 mg by mouth daily as needed. daily    [provider]  doxylamine, Sleep, (UNISOM) 25 MG tablet Take 12.5 mg by mouth at bedtime as needed.    [provider]  furosemide (LASIX) 20 MG tablet Take 1 tablet (20 mg total) by mouth daily. 02/18/19   Minus Breeding, MD  guaiFENesin (MUCINEX) 600 MG 12 hr tablet Take 600 mg by mouth 2 (two) times daily as needed.    [provider]  [name] Oil 300 MG CAPS Take 500 mg by mouth daily. Reported on 09/14/2015    [provider]  levothyroxine (SYNTHROID) 25 MCG tablet Take 1 tablet (25 mcg total) by mouth daily. Nothing by mouth except water for 2 hours before, and one hour after each dose. 03/06/19   Claretta Fraise, MD  Magnesium 250 MG TABS Take 2 tablets by mouth daily as needed (for cramping).     [provider]  metoprolol tartrate (LOPRESSOR) 50 MG tablet Take 0.5 tablets (25 mg total) by mouth 2 (two) times daily. 01/08/19   Minus Breeding, MD  rasagiline (AZILECT) 1 MG TABS tablet Take 1 tablet (1 mg total) by mouth daily. 09/26/18   Kathrynn Ducking, MD  terazosin (HYTRIN) 2 MG capsule TAKE (1) CAPSULE DAILY AT BEDTIME. Patient taking differently: Take 2 mg by mouth at bedtime.  02/05/19   Chipper Herb, MD  VIRT-GARD 2.2-25-1 MG TABS TAKE 1 TABLET DAILY Patient taking differently: Take 1 tablet by mouth daily.  11/11/18   Chipper Herb, MD    Family History Family History  Problem Relation Age of Onset   Heart disease Father    Kidney failure Father    Alcoholism Brother    Heart attack Brother    Hyperlipidemia Son    Cancer Neg Hx     Social History Social History   Tobacco Use   Smoking status: Never Smoker   Smokeless tobacco: Never Used  Substance Use Topics   Alcohol use: Never    Frequency: Never   Drug use: Never     Allergies   Aspirin, Aspirin, Calicum glycerophosphate, Niaspan [niacin er], and Zocor [simvastatin]   Review of Systems Review of Systems  Constitutional: Negative for activity change, appetite change and fever.  HENT: Negative for facial swelling.   Eyes: Negative for photophobia, pain and visual disturbance.  Respiratory: Negative for shortness of breath.   Cardiovascular: Negative for chest pain.    Gastrointestinal: Negative for abdominal pain, nausea and vomiting.  Musculoskeletal: Negative for neck pain and neck stiffness.  Skin: Negative for rash and wound.  Neurological: Negative for dizziness, syncope, facial asymmetry, speech difficulty, weakness, numbness and headaches.  Psychiatric/Behavioral: Negative for confusion and decreased concentration.     Physical Exam Updated Vital Signs BP (!) 154/79    Pulse 68    Temp (!) 97.5 F (36.4 C) (Oral)    Resp 17    Ht 5\' 9"  (1.753 m)    Wt 64 kg    SpO2 100%    BMI 20.82 kg/m  Physical Exam Vitals signs and nursing note reviewed.  Constitutional:      General: He is not in acute distress.    Appearance: Normal appearance. He is not toxic-appearing.  HENT:     Head:     Comments: Focal area of tenderness to palpation of the right occipital scalp.  Small hematoma noted.  No abrasion or laceration.    Mouth/Throat:     Mouth: Mucous membranes are moist.     Pharynx: Oropharynx is clear.  Eyes:     Extraocular Movements: Extraocular movements intact.     Conjunctiva/sclera: Conjunctivae normal.     Pupils: Pupils are equal, round, and reactive to light.  Neck:     Musculoskeletal: Full passive range of motion without pain and normal range of motion. No muscular tenderness.     Trachea: Phonation normal.  Cardiovascular:     Rate and Rhythm: Normal rate. Rhythm irregular.     Pulses: Normal pulses.     Heart sounds: Normal heart sounds.  Pulmonary:     Effort: Pulmonary effort is normal.     Breath sounds: Normal breath sounds.  Chest:     Chest wall: No tenderness.  Abdominal:     General: There is no distension.     Palpations: Abdomen is soft.     Tenderness: There is no abdominal tenderness. There is no guarding.  Musculoskeletal: Normal range of motion.        General: No tenderness or signs of injury.     Right lower leg: No edema.     Left lower leg: No edema.  Skin:    General: Skin is warm.     Capillary  Refill: Capillary refill takes less than 2 seconds.  Neurological:     General: No focal deficit present.     Mental Status: He is alert.     GCS: GCS eye subscore is 4. GCS verbal subscore is 5. GCS motor subscore is 6.     Sensory: Sensation is intact. No sensory deficit.     Motor: No weakness.     Comments: CN II-XII grossly intact.  Speech clear.  No pronator drift.  Grips are strong and symmetrical bilaterally      ED Treatments / Results  Labs (all labs ordered are listed, but only abnormal results are displayed) Labs Reviewed - No data to display  EKG None  Radiology Ct Head Wo Contrast  Result Date: 03/06/2019 CLINICAL DATA:  Pt fell in the bathroom hitting the back of his head. He has a knot on the back of his head. Pt had CT Angio Chest PE today prior to fall. Pt is on blood thinners EXAM: CT HEAD WITHOUT CONTRAST TECHNIQUE: Contiguous axial images were obtained from the base of the skull through the vertex without intravenous contrast. COMPARISON:  01/17/2019 FINDINGS: Brain: There is mild central and cortical atrophy. Periventricular white matter changes are consistent with small vessel disease. There is no evidence for hemorrhage, mass lesion, or acute infarction. Vascular: There is extensive atherosclerotic calcification of the internal carotid arteries. No hyperdense vessels. Skull: There is edema of the RIGHT parietal scalp, not associated with underlying fracture. Sinuses/Orbits: No acute finding. Other: None. IMPRESSION: 1. Atrophy and small vessel disease. 2. No evidence for acute intracranial abnormality. 3. RIGHT parietal scalp edema without underlying fracture. Electronically Signed   By: Nolon Nations M.D.   On: 03/06/2019 18:29   Ct Angio Chest W/cm &/or Wo Cm  Result Date: 03/06/2019 CLINICAL  DATA:  Shortness of breath and chest tightness EXAM: CT ANGIOGRAPHY CHEST WITH CONTRAST TECHNIQUE: Multidetector CT imaging of the chest was performed using the standard  protocol during bolus administration of intravenous contrast. Multiplanar CT image reconstructions and MIPs were obtained to evaluate the vascular anatomy. CONTRAST:  65mL OMNIPAQUE IOHEXOL 350 MG/ML SOLN COMPARISON:  Chest radiograph February 12, 2019 FINDINGS: Cardiovascular: There is no demonstrable pulmonary embolus. There is no thoracic aortic aneurysm. No dissection is evident. Note that the contrast bolus in the aorta is not sufficient for confident dissection assessment. There are foci of calcification in the proximal left subclavian and right subclavian artery. Visualized great vessels otherwise appear unremarkable. There is aortic atherosclerosis. There are foci of coronary artery calcification. There is a small pericardial effusion. The pericardium does not appear appreciably thickened. Mediastinum/Nodes: Thyroid appears unremarkable. There is no appreciable thoracic adenopathy. No esophageal lesions are evident. Lungs/Pleura: There are moderate free-flowing pleural effusions, larger on the right than on the left. There is bibasilar atelectatic change. There is no appreciable edema or consolidation. Upper Abdomen: There is reflux of contrast into the inferior vena cava and hepatic veins. There is upper abdominal aortic atherosclerosis. Visualized upper abdominal structures otherwise appear unremarkable. Musculoskeletal: There is degenerative change in the thoracic spine. No blastic or lytic bone lesions are evident. No evident chest wall lesions. Review of the MIP images confirms the above findings. IMPRESSION: 1. No demonstrable pulmonary embolus. No thoracic aortic aneurysm. No dissection seen; note that the contrast bolus in the aorta is not sufficient for meaningful dissection exclusion. There are foci of aortic atherosclerosis as well as great vessel and coronary artery calcification. 2.  Small pericardial effusion. 3. Pleural effusions bilaterally, larger on the right than on the left. Foci atelectatic  change in the lower lobes. No frank edema or consolidation evident. 4.  No adenopathy appreciable. 5. Reflux of contrast into the inferior vena cava and hepatic veins, a finding that may represent a degree of increase in right heart pressure. Aortic Atherosclerosis (ICD10-I70.0). Electronically Signed   By: Lowella Grip III M.D.   On: 03/06/2019 15:31    Procedures Procedures (including critical care time)  Medications Ordered in ED Medications - No data to display   Initial Impression / Assessment and Plan / ED Course  I have reviewed the triage vital signs and the nursing notes.  Pertinent labs & imaging results that were available during my care of the patient were reviewed by me and considered in my medical decision making (see chart for details).      Patient seen here in the emergency room after a mechanical fall in the bathroom of the radiology department.  Patient endorses history of frequent falls.  He denies symptoms preceding his fall.  Patient also seen by Dr. Stark Jock and care plan discussed.  Patient denies symptoms at present.  CT scan of the head is negative for acute intracranial injury.  No focal neurological deficits on exam patient is accompanied by his son.  Son states that he has a follow-up appointment with his PCP.  Patient appears appropriate for discharge home, head injury instructions were discussed and strict return precautions were also given.  Final Clinical Impressions(s) / ED Diagnoses   Final diagnoses:  Fall, initial encounter  Closed head injury without loss of consciousness, initial encounter    ED Discharge Orders    None       Kem Parkinson, Hershal Coria 03/06/19 1903    Veryl Speak, MD 03/06/19 2144

## 2019-03-06 NOTE — Progress Notes (Signed)
Following CT scan patient was taken to the lobby to wait for call report.  Pt had to use the restroom, I helped his daughter in law get the patient into the bathroom in his wheel chair.   He fell in bathroom hitting his head on the door.  Pt requested to go to the ED to be checked out as he is on blood thinners./bbj

## 2019-03-07 ENCOUNTER — Telehealth: Payer: Self-pay

## 2019-03-07 ENCOUNTER — Other Ambulatory Visit: Payer: Self-pay | Admitting: Family Medicine

## 2019-03-07 MED ORDER — FUROSEMIDE 40 MG PO TABS: 40.0000 mg | ORAL_TABLET | Freq: Every day | ORAL | 2 refills | Status: DC

## 2019-03-07 MED ORDER — AMOXICILLIN 500 MG PO CAPS: 500.0000 mg | ORAL_CAPSULE | Freq: Three times a day (TID) | ORAL | 0 refills | Status: DC

## 2019-03-07 MED ORDER — LISINOPRIL 5 MG PO TABS: 10.0000 mg | ORAL_TABLET | Freq: Every day | ORAL | 2 refills | Status: DC

## 2019-03-07 NOTE — Telephone Encounter (Signed)
Advised the pharmacy to continue with Lasix 20 as per cardiology note. Pharmacist stated he would cancel rx for Lasix 40 and continue with Lasix 20

## 2019-03-07 NOTE — Telephone Encounter (Signed)
Honestly, I think that patient is supposed to be only on 20 mg of Lasix.  His cardiologist note shows a reduction of dose from 40 to 20 because of renal insufficiency and intermittent hypotensive episodes and this elderly patient.  I will recommend that he continue at the 20 mg dose as prescribed by his cardiologist or contact his cardiologist for further instructions as to whether or not he feels is appropriate to increase back to 20 mg.

## 2019-03-07 NOTE — Telephone Encounter (Signed)
Dr Livia Snellen sent in a rx for lasix for patient today for 40mg  and advised to D/C 40mg . Pharmacy is calling to clarify. Patient was taking 20mg . Do you think he meant to d/c 20mg ? Please review and advise

## 2019-03-08 LAB — T3, FREE: T3, Free: 1.8 pg/mL — ABNORMAL LOW (ref 2.0–4.4)

## 2019-03-08 LAB — SPECIMEN STATUS REPORT

## 2019-03-08 LAB — VITAMIN B12: Vitamin B-12: >2000 pg/mL — ABNORMAL HIGH (ref 232–1245)

## 2019-03-08 LAB — FOLATE: Folate: >20 ng/mL (ref >3.0)

## 2019-03-09 LAB — URINE CULTURE

## 2019-03-12 ENCOUNTER — Telehealth: Payer: Self-pay | Admitting: Cardiology

## 2019-03-12 NOTE — Telephone Encounter (Signed)
New Message:    Son have to come in with pt for appt on 03-13-19. Pt is in a wheelchair and he is his medical power of attorney.

## 2019-03-12 NOTE — Telephone Encounter (Signed)
Spoke with pt's son who report he will need to be in attendance for pt's appointment tomorrow. He report he is pt's POA and pt is also in a wheelchair. Will route to nurse.

## 2019-03-12 NOTE — Progress Notes (Signed)
Cardiology Office Note   Date:  03/13/2019   ID:  [name], DOB July 18, 1932, MRN [id number]  PCP:  Claretta Fraise, MD  Cardiologist:   Minus Breeding, MD   Chief Complaint  Patient presents with  . Fatigue     History of Present Illness: Keith Hill is a 85 y.o. male who presents who was previously seen by Dr. Wynonia Lawman.  He has had a stress perfusion study in 2017 that was negative for ischemia.  He had an echocardiogram at that time.  There was a mention of mild to moderate AI and MR but a well-preserved ejection fraction with left atrial enlargement.  He was in the ED on 6/24 with dyspnea.  He had an echo which demonstrates an EF of 30 - 35%.  In the ED his creat was elevated as below.  He diuresed well in the ED and the patient preferred not to be admitted.   I saw him in the office in follow up.  He was doing well from a cardiovascular standpoint.  He had a stable creat at 1.51.    He has had progressive failure to thrive.  He has had weight loss.  Is not eating very much.  He said muscle weakness.  He has fallen and has been in the emergency room.  I do see that he has had some blood pressures that have been elevated and he was prescribed lisinopril but his son did not want him to take this because his blood pressure other times is low.  He has some decreased memory.  He is not describing any new shortness of breath, PND or orthopnea.  Is not having any new chest pressure, neck or arm discomfort.   Past Medical History:  Diagnosis Date  . Cardiac dysrhythmia, unspecified   . Colon polyps   . Colon polyps, history of   . Essential and other specified forms of tremor   . Gait abnormality 02/05/2019  . GERD (gastroesophageal reflux disease)   . Hemorrhoids, external   . Hiatal hernia   . Hyperlipidemia   . Hyperplasia of prostate   . Hypertension   . Lumbar disc disease   . Pancreatitis 1976  . Paralysis agitans ([place]) 11/07/2012  . Parkinson disease ([place])   . Peyronie's  disease   . Prostate cancer [facility])    prostate    Past Surgical History:  Procedure Laterality Date  . CATARACT EXTRACTION    . EYE SURGERY    . HEMORRHOID SURGERY    . KNEE SURGERY Left   . PROSTATE BIOPSY  04/03/2013  . PROSTATE BIOPSY  11/01/2011  . SHOULDER SURGERY Left      Current Outpatient Medications  Medication Sig Dispense Refill  . acetaminophen (TYLENOL) 500 MG tablet Take 500 mg by mouth every 6 (six) hours as needed for mild pain or moderate pain.     Marland Kitchen amoxicillin (AMOXIL) 500 MG capsule Take 1 capsule (500 mg total) by mouth 3 (three) times daily. 21 capsule 0  . apixaban (ELIQUIS) 2.5 MG TABS tablet Take 1 tablet (2.5 mg total) by mouth 2 (two) times daily. 60 tablet 5  . carbidopa-levodopa (SINEMET IR) 25-100 MG tablet TAKE 1&1/2 TABLETS IN THE MORNING AND 1 AT NOON AND EVENING 315 tablet 3  . Cholecalciferol (VITAMIN D3) 5000 UNITS CAPS Take 1 capsule by mouth 2 (two) times a week. tues and thurs    . docusate sodium (COLACE) 100 MG capsule Take 100 mg by  mouth daily as needed. daily    . doxylamine, Sleep, (UNISOM) 25 MG tablet Take 12.5 mg by mouth at bedtime as needed.    . furosemide (LASIX) 20 MG tablet Take 1 tablet (20 mg total) by mouth daily. 30 tablet 3  . guaiFENesin (MUCINEX) 600 MG 12 hr tablet Take 600 mg by mouth 2 (two) times daily as needed.    [name] Oil 300 MG CAPS Take 500 mg by mouth daily. Reported on 09/14/2015    . levothyroxine (SYNTHROID) 25 MCG tablet Take 1 tablet (25 mcg total) by mouth daily. Nothing by mouth except water for 2 hours before, and one hour after each dose. 30 tablet 1  . Magnesium 250 MG TABS Take 2 tablets by mouth daily as needed (for cramping).     . metoprolol tartrate (LOPRESSOR) 50 MG tablet Take 0.5 tablets (25 mg total) by mouth 2 (two) times daily. 90 tablet 3  . rasagiline (AZILECT) 1 MG TABS tablet Take 1 tablet (1 mg total) by mouth daily. 90 tablet 3  . terazosin (HYTRIN) 2 MG capsule TAKE (1) CAPSULE DAILY AT  BEDTIME. (Patient taking differently: Take 2 mg by mouth at bedtime. ) 90 capsule 0  . VIRT-GARD 2.2-25-1 MG TABS TAKE 1 TABLET DAILY (Patient taking differently: Take 1 tablet by mouth daily. ) 90 each 1   No current facility-administered medications for this visit.     Allergies:   Aspirin, Aspirin, Calicum glycerophosphate, Niaspan [niacin er], and Zocor [simvastatin]    ROS:  Please see the history of present illness.   Otherwise, review of systems are positive for none.   All other systems are reviewed and negative.    PHYSICAL EXAM: VS:  BP (!) 101/58   Pulse 73   Ht 5\' 8"  (1.727 m)   Wt 131 lb 3.2 oz (59.5 kg)   SpO2 95%   BMI 19.95 kg/m  , BMI Body mass index is 19.95 kg/m. GEN:  No distress, frail appearing NECK:  No jugular venous distention at 90 degrees, waveform within normal limits, carotid upstroke brisk and symmetric, no bruits, no thyromegaly LYMPHATICS:  No cervical adenopathy LUNGS:  Clear to auscultation bilaterally BACK:  No CVA tenderness CHEST:  Unremarkable HEART:  S1 and S2 within normal limits, no S3, no S4, no clicks, no rubs, 2 out of 6 apical systolic murmur radiating slightly at the aortic outflow tract, no diastolic murmurs ABD:  Positive bowel sounds normal in frequency in pitch, no bruits, no rebound, no guarding, unable to assess midline mass or bruit with the patient seated. EXT:  2 plus pulses throughout, moderate edema, no cyanosis no clubbing SKIN:  No rashes no nodules    EKG:  EKG is not  ordered today.   Recent Labs: 03/05/2019: ALT 12; BNP 2,675.2; BUN 24; Hemoglobin 13.5; Platelets 157; Potassium 3.6; Sodium 143; TSH 8.370 03/06/2019: Creatinine, Ser 1.30    Lipid Panel    Component Value Date/Time   CHOL 114 07/24/2018 1552   CHOL 127 02/06/2013 0821   TRIG 54 07/24/2018 1552   TRIG 72 05/08/2017 0815   TRIG 59 02/06/2013 0821   HDL 56 07/24/2018 1552   HDL 52 05/08/2017 0815   HDL 48 02/06/2013 0821   CHOLHDL 2.0  07/24/2018 1552   LDLCALC 47 07/24/2018 1552   LDLCALC 76 03/19/2014 0816   LDLCALC 67 02/06/2013 0821      Wt Readings from Last 3 Encounters:  03/13/19 131 lb 3.2 oz (59.5  kg)  03/06/19 141 lb (64 kg)  03/05/19 141 lb (64 kg)      Other studies Reviewed: Additional studies/ records that were reviewed today include: Labs Review of the above records demonstrates:  Please see elsewhere in the note.     ASSESSMENT AND PLAN:  CHRONIC ATRIAL FIB:     He does not notice this.  No change in therapy.   ACUTE SYSTOLIC HF:    I am going to try to reduce his medications as much as possible and reduce his Lasix down to 20 mg daily.  I talked with his son and he could certainly take an extra as needed dose of 20 mg as needed.  HTN:    Low and seems to fluctuate.  I do not want to start lisinopril.  In this gentleman "less is more".  CKD III:   Creat was down to 1.51.  This was improved and can be followed closely.   DYSLIPIDEMIA:    Stop his Lipitor just in case it is contributing to any muscle weakness and is very frail gentleman with protein calorie malnutrition and progressive failure to thrive.   MR/AI:   I am going to manage this conservatively.  Current medicines are reviewed at length with the patient today.  The patient has concerns regarding medicines.  The following changes have been made:  As above.   Labs/ tests ordered today include: None  No orders of the defined types were placed in this encounter.   Disposition:   FU with me in APP in 3 months.    Signed, Minus Breeding, MD  03/13/2019 1:02 PM    [place]

## 2019-03-12 NOTE — Telephone Encounter (Signed)

## 2019-03-13 ENCOUNTER — Encounter: Payer: Self-pay | Admitting: Cardiology

## 2019-03-13 ENCOUNTER — Other Ambulatory Visit: Payer: Self-pay

## 2019-03-13 ENCOUNTER — Ambulatory Visit (INDEPENDENT_AMBULATORY_CARE_PROVIDER_SITE_OTHER): Payer: Medicare Other | Admitting: Cardiology

## 2019-03-13 VITALS — BP 101/58 | HR 73 | Ht 68.0 in | Wt 131.2 lb

## 2019-03-13 DIAGNOSIS — R627 Adult failure to thrive: Secondary | ICD-10-CM

## 2019-03-13 DIAGNOSIS — E785 Hyperlipidemia, unspecified: Secondary | ICD-10-CM | POA: Diagnosis not present

## 2019-03-13 DIAGNOSIS — E46 Unspecified protein-calorie malnutrition: Secondary | ICD-10-CM | POA: Diagnosis not present

## 2019-03-13 DIAGNOSIS — I25118 Atherosclerotic heart disease of native coronary artery with other forms of angina pectoris: Secondary | ICD-10-CM | POA: Diagnosis not present

## 2019-03-13 DIAGNOSIS — I482 Chronic atrial fibrillation, unspecified: Secondary | ICD-10-CM

## 2019-03-13 DIAGNOSIS — R6251 Failure to thrive (child): Secondary | ICD-10-CM

## 2019-03-13 MED ORDER — FUROSEMIDE 20 MG PO TABS: 20.0000 mg | ORAL_TABLET | Freq: Every day | ORAL | 3 refills | Status: AC

## 2019-03-13 NOTE — Patient Instructions (Signed)
Medication Instructions:  STOP LISINOPRIL  STOP LIPITOR  DECREASE LASIX TO 20MG  DAILY  If you need a refill on your cardiac medications before your next appointment, please call your pharmacy.  Follow-Up: You will need a follow up appointment in 3 months-[place].   You may see Minus Breeding, MD or one of the following Advanced Practice Providers on your designated Care Team:  Rosaria Ferries, PA-C  Jory Sims, DNP, ANP     At [facility], you and your health needs are our priority.  As part of our continuing mission to provide you with exceptional heart care, we have created designated Provider Care Teams.  These Care Teams include your primary Cardiologist (physician) and Advanced Practice Providers (APPs -  Physician Assistants and Nurse Practitioners) who all work together to provide you with the care you need, when you need it.  Thank you for choosing [facility] at [facility]!!

## 2019-03-17 ENCOUNTER — Other Ambulatory Visit: Payer: Self-pay

## 2019-03-17 ENCOUNTER — Other Ambulatory Visit: Payer: Medicare Other

## 2019-03-17 DIAGNOSIS — R1084 Generalized abdominal pain: Secondary | ICD-10-CM | POA: Diagnosis not present

## 2019-03-20 LAB — PANCREATIC ELASTASE, FECAL: Pancreatic Elastase, Fecal: 223 ug Elast./g (ref >200)

## 2019-04-02 ENCOUNTER — Other Ambulatory Visit: Payer: Self-pay

## 2019-04-02 ENCOUNTER — Ambulatory Visit (INDEPENDENT_AMBULATORY_CARE_PROVIDER_SITE_OTHER): Payer: Medicare Other | Admitting: Neurology

## 2019-04-02 ENCOUNTER — Encounter: Payer: Self-pay | Admitting: Neurology

## 2019-04-02 VITALS — BP 112/72 | HR 71 | Temp 97.3°F | Ht 68.0 in | Wt 139.0 lb

## 2019-04-02 DIAGNOSIS — G20 Parkinson's disease: Secondary | ICD-10-CM

## 2019-04-02 DIAGNOSIS — R269 Unspecified abnormalities of gait and mobility: Secondary | ICD-10-CM

## 2019-04-02 DIAGNOSIS — I25118 Atherosclerotic heart disease of native coronary artery with other forms of angina pectoris: Secondary | ICD-10-CM | POA: Diagnosis not present

## 2019-04-02 DIAGNOSIS — G20A1 Parkinson's disease without dyskinesia, without mention of fluctuations: Secondary | ICD-10-CM

## 2019-04-02 MED ORDER — CARBIDOPA-LEVODOPA 25-100 MG PO TABS: ORAL_TABLET | ORAL | 3 refills | Status: AC

## 2019-04-02 NOTE — Progress Notes (Signed)
Reason for visit: Parkinson's disease, dementia  Keith Hill is an 85 y.o. male  History of present illness:  Keith Hill is an 85 year old right-handed white male with a history of Parkinson's disease.  The patient has been functioning fairly well up through February 2020, he was exercising on a regular basis, and maintaining good mobility.  He has had increasing issues with his heart function, he has had some shortness of breath issues, and along with this he has had decompensation of his cognitive functioning.  He has become more confused, he has had some hallucinations.  There have been no changes whatsoever in his Parkinson's medications in over a year.  He remains on Azilect and Sinemet.  He has undergone physical therapy several months ago, but he continues to fall on a regular basis.  He was in the emergency room on 06 March 2019 when he fell at the hospital and he bumped the back of his head, CT scan of the head was done and was unremarkable.  The patient has had 2 falls since that time.  He has a cane and a walker but he is forgetting to use these assistive devices.  He does have some assistance in the home from 11 to 4 PM Monday through Friday.  The patient is not sleeping well on occasion at nighttime but he does sleep some during the day.  The confusion continues to worsen.  He returns for an evaluation.  Past Medical History:  Diagnosis Date  . Cardiac dysrhythmia, unspecified   . Colon polyps   . Colon polyps, history of   . Essential and other specified forms of tremor   . Gait abnormality 02/05/2019  . GERD (gastroesophageal reflux disease)   . Hemorrhoids, external   . Hiatal hernia   . Hyperlipidemia   . Hyperplasia of prostate   . Hypertension   . Lumbar disc disease   . Pancreatitis 1976  . Paralysis agitans ([place]) 11/07/2012  . Parkinson disease ([place])   . Peyronie's disease   . Prostate cancer [facility])    prostate    Past Surgical History:  Procedure Laterality  Date  . CATARACT EXTRACTION    . EYE SURGERY    . HEMORRHOID SURGERY    . KNEE SURGERY Left   . PROSTATE BIOPSY  04/03/2013  . PROSTATE BIOPSY  11/01/2011  . SHOULDER SURGERY Left     Family History  Problem Relation Age of Onset  . Heart disease Father   . Kidney failure Father   . Alcoholism Brother   . Heart attack Brother   . Hyperlipidemia Son   . Cancer Neg Hx     Social history:  reports that he has never smoked. He has never used smokeless tobacco. He reports that he does not drink alcohol or use drugs.    Allergies  Allergen Reactions  . Aspirin     Upsets stomach  . Aspirin Nausea And Vomiting  . Calicum Glycerophosphate   . Niaspan [Niacin Er]   . Zocor [Simvastatin]     Medications:  Prior to Admission medications   Medication Sig Start Date End Date Taking? Authorizing Provider  acetaminophen (TYLENOL) 500 MG tablet Take 500 mg by mouth every 6 (six) hours as needed for mild pain or moderate pain.     [provider]  amoxicillin (AMOXIL) 500 MG capsule Take 1 capsule (500 mg total) by mouth 3 (three) times daily. 03/07/19   Claretta Fraise, MD  apixaban [facility])  2.5 MG TABS tablet Take 1 tablet (2.5 mg total) by mouth 2 (two) times daily. 02/18/19   Minus Breeding, MD  carbidopa-levodopa (SINEMET IR) 25-100 MG tablet TAKE 1&1/2 TABLETS IN THE MORNING AND 1 AT [facility] AND EVENING 03/21/18   Kathrynn Ducking, MD  Cholecalciferol (VITAMIN D3) 5000 UNITS CAPS Take 1 capsule by mouth 2 (two) times a week. tues and thurs    [provider]  docusate sodium (COLACE) 100 MG capsule Take 100 mg by mouth daily as needed. daily    [provider]  doxylamine, Sleep, (UNISOM) 25 MG tablet Take 12.5 mg by mouth at bedtime as needed.    [provider]  furosemide (LASIX) 20 MG tablet Take 1 tablet (20 mg total) by mouth daily. 03/13/19   Minus Breeding, MD  guaiFENesin (MUCINEX) 600 MG 12 hr tablet Take 600 mg by mouth 2 (two) times daily as  needed.    [provider]  [name] Oil 300 MG CAPS Take 500 mg by mouth daily. Reported on 09/14/2015    [provider]  levothyroxine (SYNTHROID) 25 MCG tablet Take 1 tablet (25 mcg total) by mouth daily. Nothing by mouth except water for 2 hours before, and one hour after each dose. 03/06/19   Claretta Fraise, MD  Magnesium 250 MG TABS Take 2 tablets by mouth daily as needed (for cramping).     [provider]  metoprolol tartrate (LOPRESSOR) 50 MG tablet Take 0.5 tablets (25 mg total) by mouth 2 (two) times daily. 01/08/19   Minus Breeding, MD  rasagiline (AZILECT) 1 MG TABS tablet Take 1 tablet (1 mg total) by mouth daily. 09/26/18   Kathrynn Ducking, MD  terazosin (HYTRIN) 2 MG capsule TAKE (1) CAPSULE DAILY AT BEDTIME. Patient taking differently: Take 2 mg by mouth at bedtime.  02/05/19   Chipper Herb, MD  VIRT-GARD 2.2-25-1 MG TABS TAKE 1 TABLET DAILY Patient taking differently: Take 1 tablet by mouth daily.  11/11/18   Chipper Herb, MD    ROS:  Out of a complete 14 system review of symptoms, the patient complains only of the following symptoms, and all other reviewed systems are negative.  Memory disturbance, hallucinations Walking problems, falls Shortness of breath  Blood pressure 112/72, pulse 71, temperature (!) 97.3 F (36.3 C), temperature source Temporal, height 5\' 8"  (1.727 m), weight 139 lb (63 kg), SpO2 98 %.  Physical Exam  General: The patient is alert and cooperative at the time of the examination.  Skin: No significant peripheral edema is noted.   Neurologic Exam  Mental status: The patient is alert and oriented x 3 at the time of the examination. The Mini-Mental status examination done today shows a total score 26/30.   Cranial nerves: Facial symmetry is present. Speech is normal, no aphasia or dysarthria is noted. Extraocular movements are full. Visual fields are full.  Masking of the face is seen.  Hypophonia is noted.  Motor:  The patient has good strength in all 4 extremities.  Sensory examination: Soft touch sensation is symmetric on the face, arms, and legs.  Coordination: The patient has good finger-nose-finger and heel-to-shin bilaterally.  Gait and station: The patient has the ability to stand by pushing off on the arms of the wheelchair.  Once up, he is unsteady, he is able to walk with assistance, gait is minimally wide-based.  The patient is able to stand without assistance, Romberg is negative.  Reflexes: Deep tendon reflexes are symmetric.  Assessment/Plan:  1.  Dementia, rapidly progressive  2.  Parkinson's disease  3.  Gait disorder  The patient has had a significant decline in his overall health.  His cardiac issues have worsened, he has some shortness of breath with minimal activity, he has had 9 or 10 pounds of weight loss over the last 6 months.  He will be taken off of Azilect, he will continue on the Sinemet.  He is encouraged to use the walker at all times when walking.  He will follow-up here in 4 months.  He will continue on his current dose of Sinemet.  A prescription was sent in.  Jill Alexanders MD 04/02/2019 10:12 AM  [facility] [address] [place], [place] [address]  Phone [phone] Fax [phone]

## 2019-04-02 NOTE — Patient Instructions (Signed)
Stop the Azilect 1 mg tablet, continue the Sinemet (Carbidopa) 25/100 mg tablets at the current dose.

## 2019-04-07 ENCOUNTER — Telehealth: Payer: Self-pay | Admitting: Family Medicine

## 2019-04-07 DIAGNOSIS — Z961 Presence of intraocular lens: Secondary | ICD-10-CM | POA: Diagnosis not present

## 2019-04-07 DIAGNOSIS — H353132 Nonexudative age-related macular degeneration, bilateral, intermediate dry stage: Secondary | ICD-10-CM | POA: Diagnosis not present

## 2019-04-07 DIAGNOSIS — H04123 Dry eye syndrome of bilateral lacrimal glands: Secondary | ICD-10-CM | POA: Diagnosis not present

## 2019-04-07 DIAGNOSIS — H2511 Age-related nuclear cataract, right eye: Secondary | ICD-10-CM | POA: Diagnosis not present

## 2019-04-07 NOTE — Telephone Encounter (Signed)
Spoke with Karn Pickler - FL2 forms are with [facility] and aware to call him with any questions.

## 2019-04-21 ENCOUNTER — Encounter: Payer: Self-pay | Admitting: Family Medicine

## 2019-04-21 ENCOUNTER — Ambulatory Visit (INDEPENDENT_AMBULATORY_CARE_PROVIDER_SITE_OTHER): Payer: Medicare Other | Admitting: Family Medicine

## 2019-04-21 DIAGNOSIS — G20 Parkinson's disease: Secondary | ICD-10-CM | POA: Diagnosis not present

## 2019-04-21 DIAGNOSIS — E039 Hypothyroidism, unspecified: Secondary | ICD-10-CM | POA: Diagnosis not present

## 2019-04-21 DIAGNOSIS — I25118 Atherosclerotic heart disease of native coronary artery with other forms of angina pectoris: Secondary | ICD-10-CM

## 2019-04-21 DIAGNOSIS — I482 Chronic atrial fibrillation, unspecified: Secondary | ICD-10-CM

## 2019-04-21 MED ORDER — LEVOTHYROXINE SODIUM 50 MCG PO TABS: 50.0000 ug | ORAL_TABLET | Freq: Every day | ORAL | 2 refills | Status: DC

## 2019-04-21 MED ORDER — LEVOTHYROXINE SODIUM 50 MCG PO TABS: 50.0000 ug | ORAL_TABLET | Freq: Every day | ORAL | 2 refills | Status: AC

## 2019-04-21 NOTE — Progress Notes (Addendum)
Subjective:    Patient ID: Keith Hill, male    DOB: [birth year], 85 y.o.   MRN: [id number]   HPI: Keith Hill is a 85 y.o. male presenting for thyroid visit. Having confusion some days as well. Seems he does better if he gets a good nights sleep. Walking with a walker. Eating small meals TID. Some  Swelling in the feet and legs the last few days.  BP 124/78. Dr. Percival Spanish said up to  Systolic 123XX123 is okay.  Depression screen [facility] 2/9 04/29/2019 03/05/2019 01/23/2019 11/02/2018 10/16/2018  Decreased Interest 0 0 0 0 0  Down, Depressed, Hopeless 0 0 0 0 0  PHQ - 2 Score 0 0 0 0 0  Altered sleeping 0 0 0 - -  Tired, decreased energy 0 0 0 - -  Change in appetite - 0 0 - -  Feeling bad or failure about yourself  0 0 0 - -  Trouble concentrating 0 0 0 - -  Moving slowly or fidgety/restless 0 0 0 - -  Suicidal thoughts 0 0 0 - -  PHQ-9 Score 0 0 0 - -  Some recent data might be hidden     Relevant past medical, surgical, family and social history reviewed and updated as indicated.  Interim medical history since our last visit reviewed. Allergies and medications reviewed and updated.  ROS:  Review of Systems  Constitutional: Negative.   HENT: Negative.   Eyes: Negative for visual disturbance.  Respiratory: Negative for cough and shortness of breath.   Cardiovascular: Negative for chest pain and leg swelling.  Gastrointestinal: Negative for abdominal pain, diarrhea, nausea and vomiting.  Genitourinary: Negative for difficulty urinating.  Musculoskeletal: Positive for arthralgias. Negative for myalgias.  Skin: Negative for rash.  Neurological: Negative for headaches.  Psychiatric/Behavioral: Negative for sleep disturbance.     Social History   Tobacco Use  Smoking Status Never Smoker  Smokeless Tobacco Never Used       Objective:     Wt Readings from Last 3 Encounters:  04/29/19 142 lb (64.4 kg)  04/02/19 139 lb (63 kg)  03/13/19 131 lb 3.2 oz (59.5 kg)     Exam  deferred. Pt. Harboring due to COVID 19. Phone visit performed.   Assessment & Plan:   1. Acquired hypothyroidism   2. Chronic atrial fibrillation   3. Parkinson disease ([place])     Meds ordered this encounter  Medications  . DISCONTD: levothyroxine (SYNTHROID) 50 MCG tablet    Sig: Take 1 tablet (50 mcg total) by mouth daily. Nothing by mouth except water for 2 hours before, and one hour after each dose.    Dispense:  30 tablet    Refill:  2  . levothyroxine (SYNTHROID) 50 MCG tablet    Sig: Take 1 tablet (50 mcg total) by mouth daily. Nothing by mouth except water for 2 hours before, and one hour after each dose.    Dispense:  30 tablet    Refill:  2    No orders of the defined types were placed in this encounter.     Diagnoses and all orders for this visit:  Acquired hypothyroidism  Chronic atrial fibrillation  Parkinson disease ([place])  Other orders -     Discontinue: levothyroxine (SYNTHROID) 50 MCG tablet; Take 1 tablet (50 mcg total) by mouth daily. Nothing by mouth except water for 2 hours before, and one hour after each dose. -     levothyroxine (SYNTHROID) 50  MCG tablet; Take 1 tablet (50 mcg total) by mouth daily. Nothing by mouth except water for 2 hours before, and one hour after each dose.    Virtual Visit via telephone Note  I discussed the limitations, risks, security and privacy concerns of performing an evaluation and management service by telephone and the availability of in person appointments. The patient was identified with two identifiers. Pt.expressed understanding and agreed to proceed. Pt. Is at home. Dr. Livia Snellen is in his office.  Follow Up Instructions:   I discussed the assessment and treatment plan with the patient. The patient was provided an opportunity to ask questions and all were answered. The patient agreed with the plan and demonstrated an understanding of the instructions.   The patient was advised to call back or seek an in-person  evaluation if the symptoms worsen or if the condition fails to improve as anticipated.  Furosemide 20 mg can be taken BID on days when swelling or dyspnea is a little worse   Total minutes including chart review and phone contact time: 22   Follow up plan: No follow-ups on file.  Claretta Fraise, MD [place]

## 2019-04-29 ENCOUNTER — Other Ambulatory Visit: Payer: Self-pay

## 2019-04-29 ENCOUNTER — Ambulatory Visit (INDEPENDENT_AMBULATORY_CARE_PROVIDER_SITE_OTHER): Payer: Medicare Other | Admitting: Family Medicine

## 2019-04-29 ENCOUNTER — Encounter: Payer: Self-pay | Admitting: Family Medicine

## 2019-04-29 VITALS — BP 97/50 | HR 81 | Temp 98.0°F | Ht 68.0 in | Wt 142.0 lb

## 2019-04-29 DIAGNOSIS — S90821A Blister (nonthermal), right foot, initial encounter: Secondary | ICD-10-CM | POA: Diagnosis not present

## 2019-04-29 DIAGNOSIS — R600 Localized edema: Secondary | ICD-10-CM | POA: Diagnosis not present

## 2019-04-29 NOTE — Progress Notes (Signed)
Subjective: CC: blister PCP: Claretta Fraise, MD HPI:Keith Hill is a 85 y.o. male presenting to clinic today for:  1. Blister Patient has a blister on the right foot that is been present for a couple of days now.  Was initially fluid-filled but now has popped.  He is brought to the office by his son to have a look at this.  They noticed that he has lower extremity edema as well and for the last 5 days his son has been increasing him on the furosemide.  He notes that on 20 mg he has increased urine output so he certainly has been having increased urine output with the double dose.  He often sits in a recliner at home with legs not elevated.  Denies orthopnea, SOB, CP.  Medical history is significant for atrial fibrillation, heart failure and renal disease.  He sits for usually 23 hours/day per his son's report.  He has intermittent left-sided hip pain that comes and goes for only a few minutes.  He notes that no specific positions cause the pain and that comes at random.  Not currently taking any medications to help with this.  He has had 10 falls over the last 3 months.  He has been evaluated in the emergency department multiple times and had imaging with no apparent fractures or dislocations.   ROS: Per HPI  Allergies  Allergen Reactions  . Aspirin     Upsets stomach  . Aspirin Nausea And Vomiting  . Calicum Glycerophosphate   . Niaspan [Niacin Er]   . Zocor [Simvastatin]    Past Medical History:  Diagnosis Date  . Cardiac dysrhythmia, unspecified   . Colon polyps   . Colon polyps, history of   . Essential and other specified forms of tremor   . Gait abnormality 02/05/2019  . GERD (gastroesophageal reflux disease)   . Hemorrhoids, external   . Hiatal hernia   . Hyperlipidemia   . Hyperplasia of prostate   . Hypertension   . Lumbar disc disease   . Pancreatitis 1976  . Paralysis agitans ([place]) 11/07/2012  . Parkinson disease ([place])   . Peyronie's disease   . Prostate cancer  [facility])    prostate    Current Outpatient Medications:  .  acetaminophen (TYLENOL) 500 MG tablet, Take 500 mg by mouth every 6 (six) hours as needed for mild pain or moderate pain. , Disp: , Rfl:  .  apixaban (ELIQUIS) 2.5 MG TABS tablet, Take 1 tablet (2.5 mg total) by mouth 2 (two) times daily., Disp: 60 tablet, Rfl: 5 .  carbidopa-levodopa (SINEMET IR) 25-100 MG tablet, TAKE 1&1/2 TABLETS IN THE MORNING AND 1 AT NOON AND EVENING, Disp: 315 tablet, Rfl: 3 .  Cholecalciferol (VITAMIN D3) 5000 UNITS CAPS, Take 1 capsule by mouth 2 (two) times a week. tues and thurs, Disp: , Rfl:  .  docusate sodium (COLACE) 100 MG capsule, Take 100 mg by mouth daily as needed. daily, Disp: , Rfl:  .  doxylamine, Sleep, (UNISOM) 25 MG tablet, Take 12.5 mg by mouth at bedtime as needed., Disp: , Rfl:  .  furosemide (LASIX) 20 MG tablet, Take 1 tablet (20 mg total) by mouth daily., Disp: 30 tablet, Rfl: 3 .  Krill Oil 300 MG CAPS, Take 500 mg by mouth daily. Reported on 09/14/2015, Disp: , Rfl:  .  levothyroxine (SYNTHROID) 50 MCG tablet, Take 1 tablet (50 mcg total) by mouth daily. Nothing by mouth except water for 2 hours before,  and one hour after each dose., Disp: 30 tablet, Rfl: 2 .  Magnesium 250 MG TABS, Take 2 tablets by mouth daily as needed (for cramping). , Disp: , Rfl:  .  metoprolol tartrate (LOPRESSOR) 50 MG tablet, Take 0.5 tablets (25 mg total) by mouth 2 (two) times daily., Disp: 90 tablet, Rfl: 3 .  terazosin (HYTRIN) 2 MG capsule, TAKE (1) CAPSULE DAILY AT BEDTIME. (Patient taking differently: Take 2 mg by mouth at bedtime. ), Disp: 90 capsule, Rfl: 0 .  VIRT-GARD 2.2-25-1 MG TABS, TAKE 1 TABLET DAILY (Patient taking differently: Take 1 tablet by mouth daily. ), Disp: 90 each, Rfl: 1 Social History   Socioeconomic History  . Marital status: Married    Spouse name: [name]  . Number of children: 1  . Years of education: College  . Highest education level: Not on file  Occupational History  .  Occupation: retired    Comment: Building control surveyor: RETIRED  . Occupation: retired  Scientific laboratory technician  . Financial resource strain: Not hard at all  . Food insecurity    Worry: Never true    Inability: Never true  . Transportation needs    Medical: No    Non-medical: No  Tobacco Use  . Smoking status: Never Smoker  . Smokeless tobacco: Never Used  Substance and Sexual Activity  . Alcohol use: Never    Frequency: Never  . Drug use: Never  . Sexual activity: Yes  Lifestyle  . Physical activity    Days per week: 0 days    Minutes per session: 0 min  . Stress: Not at all  Relationships  . Social connections    Talks on phone: More than three times a week    Gets together: More than three times a week    Attends religious service: More than 4 times per year    Active member of club or organization: Yes    Attends meetings of clubs or organizations: More than 4 times per year    Relationship status: Married  . Intimate partner violence    Fear of current or ex partner: No    Emotionally abused: No    Physically abused: No    Forced sexual activity: No  Other Topics Concern  . Not on file  Social History Narrative   ** Merged History Encounter **       Patient lives at home with spouse. Caffeine Use: 2-3 sodas daily Patient is right handed.   Family History  Problem Relation Age of Onset  . Heart disease Father   . Kidney failure Father   . Alcoholism Brother   . Heart attack Brother   . Hyperlipidemia Son   . Cancer Neg Hx     Objective: Office vital signs reviewed. BP (!) 97/50   Pulse 81   Temp 98 F (36.7 C) (Temporal)   Ht 5\' 8"  (1.727 m)   Wt 142 lb (64.4 kg)   BMI 21.59 kg/m   Physical Examination:  General: Awake, alert, No acute distress HEENT: Normal; no JVD Cardio: seemingly regular rate and rhythm, S1S2 heard, no murmurs appreciated Pulm: clear to auscultation bilaterally, no wheezes, rhonchi or rales; normal work of breathing on room air  Extremities: warm, well perfused, 1+ pitting edema to mid shins bilaterally. No cyanosis or clubbing; +2 pulses bilaterally MSK: arrives in wheelchair Skin: large deflated blister noted along the dorsum of the right foot.  It and compresses the entire forefoot.  There is mild erythema but no appreciable increase in warmth.  He has serous fluid but no purulence.  It is tender to palpation.  No active bleeding.  Assessment/ Plan: 85 y.o. male   1. Blister of right foot, initial encounter Likely secondary to mechanical rubbing in the setting of chronic lower extremity edema.  Nothing to suggest infection at this point.  He was bandaged and home care instructions for wound care were reviewed with the patient and his son.  Handout was provided.  We discussed red flag signs and symptoms including signs and symptoms of infection.  He was good understanding of follow-up PRN  2. Bilateral leg edema Likely related to dependent edema, vascular insufficiency.  Nothing to suggest exacerbation of heart failure today.  His lungs were clear.  We discussed not staying on the increased dose of Lasix, particularly given borderline hypotension.  I have advised him to keep legs elevated at heart level.  I offered Unna boots today but patient declined.  Check K and Cr given increased lasix doses.  Continue to follow with PCP and cardiology as directed - Basic Metabolic Panel   Orders Placed This Encounter  Procedures  . Basic Metabolic Panel   No orders of the defined types were placed in this encounter.    Janora Norlander, DO [place] [phone]

## 2019-04-29 NOTE — Patient Instructions (Addendum)
The blister does not appear infected We discussed keeping legs elevated is very important If he develops any signs/ symptoms of infection, contact the office immediately.  We are checking his potassium level and kidney function since he has been taking more of the Lasix than normal.   Wound Care, Adult Taking care of your wound properly can help to prevent pain, infection, and scarring. It can also help your wound to heal more quickly. How to care for your wound Wound care      Follow instructions from your health care provider about how to take care of your wound. Make sure you: ? Wash your hands with soap and water before you change the bandage (dressing). If soap and water are not available, use hand sanitizer. ? Change your dressing as told by your health care provider. ? Leave stitches (sutures), skin glue, or adhesive strips in place. These skin closures may need to stay in place for 2 weeks or longer. If adhesive strip edges start to loosen and curl up, you may trim the loose edges. Do not remove adhesive strips completely unless your health care provider tells you to do that.  Check your wound area every day for signs of infection. Check for: ? Redness, swelling, or pain. ? Fluid or blood. ? Warmth. ? Pus or a bad smell.  Ask your health care provider if you should clean the wound with mild soap and water. Doing this may include: ? Using a clean towel to pat the wound dry after cleaning it. Do not rub or scrub the wound. ? Applying a cream or ointment. Do this only as told by your health care provider. ? Covering the incision with a clean dressing.  Ask your health care provider when you can leave the wound uncovered.  Keep the dressing dry until your health care provider says it can be removed. Do not take baths, swim, use a hot tub, or do anything that would put the wound underwater until your health care provider approves. Ask your health care provider if you can take  showers. You may only be allowed to take sponge baths. Medicines   If you were prescribed an antibiotic medicine, cream, or ointment, take or use the antibiotic as told by your health care provider. Do not stop taking or using the antibiotic even if your condition improves.  Take over-the-counter and prescription medicines only as told by your health care provider. If you were prescribed pain medicine, take it 30 or more minutes before you do any wound care or as told by your health care provider. General instructions  Return to your normal activities as told by your health care provider. Ask your health care provider what activities are safe.  Do not scratch or pick at the wound.  Do not use any products that contain nicotine or tobacco, such as cigarettes and e-cigarettes. These may delay wound healing. If you need help quitting, ask your health care provider.  Keep all follow-up visits as told by your health care provider. This is important.  Eat a diet that includes protein, vitamin A, vitamin C, and other nutrient-rich foods to help the wound heal. ? Foods rich in protein include meat, dairy, beans, nuts, and other sources. ? Foods rich in vitamin A include carrots and dark green, leafy vegetables. ? Foods rich in vitamin C include citrus, tomatoes, and other fruits and vegetables. ? Nutrient-rich foods have protein, carbohydrates, fat, vitamins, or minerals. Eat a variety of healthy foods including  vegetables, fruits, and whole grains. Contact a health care provider if:  You received a tetanus shot and you have swelling, severe pain, redness, or bleeding at the injection site.  Your pain is not controlled with medicine.  You have redness, swelling, or pain around the wound.  You have fluid or blood coming from the wound.  Your wound feels warm to the touch.  You have pus or a bad smell coming from the wound.  You have a fever or chills.  You are nauseous or you vomit.   You are dizzy. Get help right away if:  You have a red streak going away from your wound.  The edges of the wound open up and separate.  Your wound is bleeding, and the bleeding does not stop with gentle pressure.  You have a rash.  You faint.  You have trouble breathing. Summary  Always wash your hands with soap and water before changing your bandage (dressing).  To help with healing, eat foods that are rich in protein, vitamin A, vitamin C, and other nutrients.  Check your wound every day for signs of infection. Contact your health care provider if you suspect that your wound is infected. This information is not intended to replace advice given to you by your health care provider. Make sure you discuss any questions you have with your health care provider. Document Released: 05/16/2008 Document Revised: 11/25/2018 Document Reviewed: 02/22/2016 Elsevier Patient Education  [employer] [place].  Edema  Edema is when you have too much fluid in your body or under your skin. Edema may make your legs, feet, and ankles swell up. Swelling is also common in looser tissues, like around your eyes. This is a common condition. It gets more common as you get older. There are many possible causes of edema. Eating too much salt (sodium) and being on your feet or sitting for a long time can cause edema in your legs, feet, and ankles. Hot weather may make edema worse. Edema is usually painless. Your skin may look swollen or shiny. Follow these instructions at home:  Keep the swollen body part raised (elevated) above the level of your heart when you are sitting or lying down.  Do not sit still or stand for a long time.  Do not wear tight clothes. Do not wear garters on your upper legs.  Exercise your legs. This can help the swelling go down.  Wear elastic bandages or support stockings as told by your doctor.  Eat a low-salt (low-sodium) diet to reduce fluid as told by your doctor.  Depending  on the cause of your swelling, you may need to limit how much fluid you drink (fluid restriction).  Take over-the-counter and prescription medicines only as told by your doctor. Contact a doctor if:  Treatment is not working.  You have heart, liver, or kidney disease and have symptoms of edema.  You have sudden and unexplained weight gain. Get help right away if:  You have shortness of breath or chest pain.  You cannot breathe when you lie down.  You have pain, redness, or warmth in the swollen areas.  You have heart, liver, or kidney disease and get edema all of a sudden.  You have a fever and your symptoms get worse all of a sudden. Summary  Edema is when you have too much fluid in your body or under your skin.  Edema may make your legs, feet, and ankles swell up. Swelling is also common in looser  tissues, like around your eyes.  Raise (elevate) the swollen body part above the level of your heart when you are sitting or lying down.  Follow your doctor's instructions about diet and how much fluid you can drink (fluid restriction). This information is not intended to replace advice given to you by your health care provider. Make sure you discuss any questions you have with your health care provider. Document Released: 01/24/2008 Document Revised: 08/10/2017 Document Reviewed: 08/25/2016 Elsevier Patient Education  2020 [employer].

## 2019-04-30 LAB — BASIC METABOLIC PANEL
BUN/Creatinine Ratio: 18 (ref 10–24)
BUN: 30 mg/dL — ABNORMAL HIGH (ref 8–27)
CO2: 25 mmol/L (ref 20–29)
Calcium: 8.4 mg/dL — ABNORMAL LOW (ref 8.6–10.2)
Chloride: 100 mmol/L (ref 96–106)
Creatinine, Ser: 1.7 mg/dL — ABNORMAL HIGH (ref 0.76–1.27)
GFR calc Af Amer: 42 mL/min/{1.73_m2} — ABNORMAL LOW (ref >59)
GFR calc non Af Amer: 36 mL/min/{1.73_m2} — ABNORMAL LOW (ref >59)
Glucose: 93 mg/dL (ref 65–99)
Potassium: 3.3 mmol/L — ABNORMAL LOW (ref 3.5–5.2)
Sodium: 142 mmol/L (ref 134–144)

## 2019-05-05 ENCOUNTER — Telehealth: Payer: Self-pay | Admitting: Family Medicine

## 2019-05-05 NOTE — Telephone Encounter (Signed)
He would need face to face visit either in person (preferred) or by video to outline the services needed.

## 2019-05-05 NOTE — Telephone Encounter (Signed)
Pt was seen in office last week If appropriate place order for [facility]

## 2019-05-06 NOTE — Addendum Note (Signed)
Addended by: Janora Norlander on: 05/06/2019 05:02 PM   Modules accepted: Orders

## 2019-05-06 NOTE — Telephone Encounter (Signed)
Son aware.  Karn Pickler would like to know if Dr. Lajuana Ripple could place the home health order since she seen him last week for the blister

## 2019-05-06 NOTE — Telephone Encounter (Signed)
Yes. I will place.   Tye Maryland can I just addend last note?

## 2019-05-07 ENCOUNTER — Telehealth: Payer: Self-pay | Admitting: Family Medicine

## 2019-05-07 NOTE — Telephone Encounter (Signed)
Aware referral was sent to [facility] and they will be contacting them to start care in the next 2-3 days

## 2019-05-07 NOTE — Telephone Encounter (Signed)
Son aware that home health referral has been placed.

## 2019-05-10 DIAGNOSIS — I4891 Unspecified atrial fibrillation: Secondary | ICD-10-CM | POA: Diagnosis not present

## 2019-05-10 DIAGNOSIS — K219 Gastro-esophageal reflux disease without esophagitis: Secondary | ICD-10-CM | POA: Diagnosis not present

## 2019-05-10 DIAGNOSIS — Z7901 Long term (current) use of anticoagulants: Secondary | ICD-10-CM | POA: Diagnosis not present

## 2019-05-10 DIAGNOSIS — Z9181 History of falling: Secondary | ICD-10-CM | POA: Diagnosis not present

## 2019-05-10 DIAGNOSIS — I110 Hypertensive heart disease with heart failure: Secondary | ICD-10-CM | POA: Diagnosis not present

## 2019-05-10 DIAGNOSIS — M25552 Pain in left hip: Secondary | ICD-10-CM | POA: Diagnosis not present

## 2019-05-10 DIAGNOSIS — N289 Disorder of kidney and ureter, unspecified: Secondary | ICD-10-CM | POA: Diagnosis not present

## 2019-05-10 DIAGNOSIS — K644 Residual hemorrhoidal skin tags: Secondary | ICD-10-CM | POA: Diagnosis not present

## 2019-05-10 DIAGNOSIS — I509 Heart failure, unspecified: Secondary | ICD-10-CM | POA: Diagnosis not present

## 2019-05-10 DIAGNOSIS — N486 Induration penis plastica: Secondary | ICD-10-CM | POA: Diagnosis not present

## 2019-05-10 DIAGNOSIS — E785 Hyperlipidemia, unspecified: Secondary | ICD-10-CM | POA: Diagnosis not present

## 2019-05-10 DIAGNOSIS — G20 Parkinson's disease: Secondary | ICD-10-CM | POA: Diagnosis not present

## 2019-05-10 DIAGNOSIS — C61 Malignant neoplasm of prostate: Secondary | ICD-10-CM | POA: Diagnosis not present

## 2019-05-10 DIAGNOSIS — N400 Enlarged prostate without lower urinary tract symptoms: Secondary | ICD-10-CM | POA: Diagnosis not present

## 2019-05-10 DIAGNOSIS — Z4800 Encounter for change or removal of nonsurgical wound dressing: Secondary | ICD-10-CM | POA: Diagnosis not present

## 2019-05-10 DIAGNOSIS — R600 Localized edema: Secondary | ICD-10-CM | POA: Diagnosis not present

## 2019-05-10 DIAGNOSIS — K449 Diaphragmatic hernia without obstruction or gangrene: Secondary | ICD-10-CM | POA: Diagnosis not present

## 2019-05-10 DIAGNOSIS — S90821A Blister (nonthermal), right foot, initial encounter: Secondary | ICD-10-CM | POA: Diagnosis not present

## 2019-05-10 DIAGNOSIS — M5136 Other intervertebral disc degeneration, lumbar region: Secondary | ICD-10-CM | POA: Diagnosis not present

## 2019-05-14 ENCOUNTER — Ambulatory Visit: Payer: Medicare Other | Admitting: Family Medicine

## 2019-05-15 ENCOUNTER — Telehealth: Payer: Self-pay | Admitting: Cardiology

## 2019-05-15 NOTE — Telephone Encounter (Signed)
Returned call to son he states pt is deteriorating(over the last month or 2) and he states that he has LE swelling feet and ankles with blisters on his feet he has had the blisters for 2-3 weeks. The home care is coming once a week to dress these blisters and she states that she does not hear anything around his heart per son. He stated that his weight was 145# today from our records he is up 3#. He states that he will give an additional dose today to try to combat current swelling and will re-weigh again tomorrow to see how his weight is then.

## 2019-05-15 NOTE — Telephone Encounter (Signed)
New Message    Pt c/o swelling: STAT is pt has developed SOB within 24 hours  1) How much weight have you gained and in what time span? None  2) If swelling, where is the swelling located? Above ankles down to his toes   3) Are you currently taking a fluid pill? Lasix  4) Are you currently SOB? yes  5) Do you have a log of your daily weights (if so, list)? Patient ways 141 or 142 all the time   6) Have you gained 3 pounds in a day or 5 pounds in a week? NO  7) Have you traveled recently? No   Patient's son would like to speak to the doctor about the situation to see what the could do about the swelling and blisters patient is developing on his feet.

## 2019-05-15 NOTE — Telephone Encounter (Signed)
He can take increased Lasix 20 mg for a few days in a row (20 mg to see if that helps.)

## 2019-05-16 ENCOUNTER — Other Ambulatory Visit: Payer: Self-pay | Admitting: Family Medicine

## 2019-05-16 NOTE — Telephone Encounter (Signed)
Advised son, verbalized understanding.  Will call back if no improvement

## 2019-05-23 DIAGNOSIS — I5089 Other heart failure: Secondary | ICD-10-CM | POA: Diagnosis not present

## 2019-05-23 DIAGNOSIS — I4891 Unspecified atrial fibrillation: Secondary | ICD-10-CM | POA: Diagnosis not present

## 2019-05-23 DIAGNOSIS — E039 Hypothyroidism, unspecified: Secondary | ICD-10-CM | POA: Diagnosis not present

## 2019-05-23 DIAGNOSIS — C61 Malignant neoplasm of prostate: Secondary | ICD-10-CM | POA: Diagnosis not present

## 2019-05-23 DIAGNOSIS — H04129 Dry eye syndrome of unspecified lacrimal gland: Secondary | ICD-10-CM | POA: Diagnosis not present

## 2019-05-23 DIAGNOSIS — G20 Parkinson's disease: Secondary | ICD-10-CM | POA: Diagnosis not present

## 2019-05-23 DIAGNOSIS — R682 Dry mouth, unspecified: Secondary | ICD-10-CM | POA: Diagnosis not present

## 2019-05-23 DIAGNOSIS — I158 Other secondary hypertension: Secondary | ICD-10-CM | POA: Diagnosis not present

## 2019-05-23 DIAGNOSIS — R52 Pain, unspecified: Secondary | ICD-10-CM | POA: Diagnosis not present

## 2019-05-23 DIAGNOSIS — F419 Anxiety disorder, unspecified: Secondary | ICD-10-CM | POA: Diagnosis not present

## 2019-05-23 DIAGNOSIS — R0989 Other specified symptoms and signs involving the circulatory and respiratory systems: Secondary | ICD-10-CM | POA: Diagnosis not present

## 2019-05-24 DIAGNOSIS — I158 Other secondary hypertension: Secondary | ICD-10-CM | POA: Diagnosis not present

## 2019-05-24 DIAGNOSIS — C61 Malignant neoplasm of prostate: Secondary | ICD-10-CM | POA: Diagnosis not present

## 2019-05-24 DIAGNOSIS — I5089 Other heart failure: Secondary | ICD-10-CM | POA: Diagnosis not present

## 2019-05-24 DIAGNOSIS — I4891 Unspecified atrial fibrillation: Secondary | ICD-10-CM | POA: Diagnosis not present

## 2019-05-24 DIAGNOSIS — G20 Parkinson's disease: Secondary | ICD-10-CM | POA: Diagnosis not present

## 2019-05-24 DIAGNOSIS — E039 Hypothyroidism, unspecified: Secondary | ICD-10-CM | POA: Diagnosis not present

## 2019-05-26 DIAGNOSIS — E039 Hypothyroidism, unspecified: Secondary | ICD-10-CM | POA: Diagnosis not present

## 2019-05-26 DIAGNOSIS — I158 Other secondary hypertension: Secondary | ICD-10-CM | POA: Diagnosis not present

## 2019-05-26 DIAGNOSIS — I5089 Other heart failure: Secondary | ICD-10-CM | POA: Diagnosis not present

## 2019-05-26 DIAGNOSIS — I4891 Unspecified atrial fibrillation: Secondary | ICD-10-CM | POA: Diagnosis not present

## 2019-05-26 DIAGNOSIS — C61 Malignant neoplasm of prostate: Secondary | ICD-10-CM | POA: Diagnosis not present

## 2019-05-26 DIAGNOSIS — G20 Parkinson's disease: Secondary | ICD-10-CM | POA: Diagnosis not present

## 2019-05-28 DIAGNOSIS — I4891 Unspecified atrial fibrillation: Secondary | ICD-10-CM | POA: Diagnosis not present

## 2019-05-28 DIAGNOSIS — I5089 Other heart failure: Secondary | ICD-10-CM | POA: Diagnosis not present

## 2019-05-28 DIAGNOSIS — G20 Parkinson's disease: Secondary | ICD-10-CM | POA: Diagnosis not present

## 2019-05-28 DIAGNOSIS — C61 Malignant neoplasm of prostate: Secondary | ICD-10-CM | POA: Diagnosis not present

## 2019-05-28 DIAGNOSIS — E039 Hypothyroidism, unspecified: Secondary | ICD-10-CM | POA: Diagnosis not present

## 2019-05-28 DIAGNOSIS — I158 Other secondary hypertension: Secondary | ICD-10-CM | POA: Diagnosis not present

## 2019-05-30 DIAGNOSIS — C61 Malignant neoplasm of prostate: Secondary | ICD-10-CM | POA: Diagnosis not present

## 2019-05-30 DIAGNOSIS — Z7401 Bed confinement status: Secondary | ICD-10-CM | POA: Diagnosis not present

## 2019-05-30 DIAGNOSIS — I158 Other secondary hypertension: Secondary | ICD-10-CM | POA: Diagnosis not present

## 2019-05-30 DIAGNOSIS — R29898 Other symptoms and signs involving the musculoskeletal system: Secondary | ICD-10-CM | POA: Diagnosis not present

## 2019-05-30 DIAGNOSIS — I4891 Unspecified atrial fibrillation: Secondary | ICD-10-CM | POA: Diagnosis not present

## 2019-05-30 DIAGNOSIS — R404 Transient alteration of awareness: Secondary | ICD-10-CM | POA: Diagnosis not present

## 2019-05-30 DIAGNOSIS — E039 Hypothyroidism, unspecified: Secondary | ICD-10-CM | POA: Diagnosis not present

## 2019-05-30 DIAGNOSIS — G20 Parkinson's disease: Secondary | ICD-10-CM | POA: Diagnosis not present

## 2019-05-30 DIAGNOSIS — R52 Pain, unspecified: Secondary | ICD-10-CM | POA: Diagnosis not present

## 2019-05-30 DIAGNOSIS — I5089 Other heart failure: Secondary | ICD-10-CM | POA: Diagnosis not present

## 2019-05-31 DIAGNOSIS — G20 Parkinson's disease: Secondary | ICD-10-CM | POA: Diagnosis not present

## 2019-05-31 DIAGNOSIS — I4891 Unspecified atrial fibrillation: Secondary | ICD-10-CM | POA: Diagnosis not present

## 2019-05-31 DIAGNOSIS — I158 Other secondary hypertension: Secondary | ICD-10-CM | POA: Diagnosis not present

## 2019-05-31 DIAGNOSIS — E039 Hypothyroidism, unspecified: Secondary | ICD-10-CM | POA: Diagnosis not present

## 2019-05-31 DIAGNOSIS — C61 Malignant neoplasm of prostate: Secondary | ICD-10-CM | POA: Diagnosis not present

## 2019-05-31 DIAGNOSIS — I5089 Other heart failure: Secondary | ICD-10-CM | POA: Diagnosis not present

## 2019-06-04 ENCOUNTER — Ambulatory Visit: Payer: Medicare Other | Admitting: Cardiology

## 2019-06-22 DEATH — deceased

## 2019-07-01 ENCOUNTER — Ambulatory Visit: Payer: Medicare Other | Admitting: Family Medicine

## 2019-07-29 ENCOUNTER — Ambulatory Visit: Payer: Medicare Other | Admitting: Neurology

## 2019-08-26 ENCOUNTER — Ambulatory Visit: Payer: Self-pay | Admitting: Neurology
